# Patient Record
Sex: Male | Born: 1965
Health system: Southern US, Community
[De-identification: ages and names within clinical notes are randomized; demographics above are authoritative.]

## PROBLEM LIST (undated history)

## (undated) DIAGNOSIS — E119 Type 2 diabetes mellitus without complications: Secondary | ICD-10-CM

## (undated) DIAGNOSIS — E785 Hyperlipidemia, unspecified: Secondary | ICD-10-CM

## (undated) DIAGNOSIS — M109 Gout, unspecified: Secondary | ICD-10-CM

## (undated) DIAGNOSIS — N189 Chronic kidney disease, unspecified: Secondary | ICD-10-CM

## (undated) DIAGNOSIS — G4733 Obstructive sleep apnea (adult) (pediatric): Secondary | ICD-10-CM

## (undated) DIAGNOSIS — T7840XA Allergy, unspecified, initial encounter: Secondary | ICD-10-CM

## (undated) DIAGNOSIS — G4730 Sleep apnea, unspecified: Secondary | ICD-10-CM

## (undated) DIAGNOSIS — R7303 Prediabetes: Secondary | ICD-10-CM

## (undated) DIAGNOSIS — I10 Essential (primary) hypertension: Secondary | ICD-10-CM

## (undated) DIAGNOSIS — K219 Gastro-esophageal reflux disease without esophagitis: Secondary | ICD-10-CM

## (undated) DIAGNOSIS — N200 Calculus of kidney: Secondary | ICD-10-CM

## (undated) DIAGNOSIS — E559 Vitamin D deficiency, unspecified: Secondary | ICD-10-CM

## (undated) HISTORY — DX: Obstructive sleep apnea (adult) (pediatric): G47.33

## (undated) HISTORY — DX: Chronic kidney disease, unspecified: N18.9

## (undated) HISTORY — DX: Calculus of kidney: N20.0

## (undated) HISTORY — DX: Gastro-esophageal reflux disease without esophagitis: K21.9

## (undated) HISTORY — DX: Sleep apnea, unspecified: G47.30

## (undated) HISTORY — DX: Vitamin D deficiency, unspecified: E55.9

## (undated) HISTORY — DX: Allergy, unspecified, initial encounter: T78.40XA

## (undated) HISTORY — DX: Gout, unspecified: M10.9

## (undated) HISTORY — DX: Type 2 diabetes mellitus without complications: E11.9

## (undated) HISTORY — DX: Hyperlipidemia, unspecified: E78.5

---

## 1995-11-28 HISTORY — PX: KNEE ARTHROSCOPY: SHX127

## 2001-03-26 ENCOUNTER — Emergency Department (HOSPITAL_COMMUNITY): Admission: EM | Admit: 2001-03-26 | Discharge: 2001-03-27 | Payer: Self-pay | Admitting: *Deleted

## 2010-01-08 ENCOUNTER — Encounter: Admission: RE | Admit: 2010-01-08 | Discharge: 2010-01-08 | Payer: Self-pay | Admitting: Internal Medicine

## 2011-02-13 LAB — LIPID PANEL
LDL Cholesterol: 129 mg/dL
Triglycerides: 285 mg/dL — AB (ref 40–160)

## 2011-02-13 LAB — BASIC METABOLIC PANEL: Glucose: 104 mg/dL

## 2011-03-05 ENCOUNTER — Encounter: Payer: Self-pay | Admitting: *Deleted

## 2011-06-11 ENCOUNTER — Encounter: Payer: Self-pay | Admitting: Cardiology

## 2012-02-07 ENCOUNTER — Emergency Department
Admission: EM | Admit: 2012-02-07 | Discharge: 2012-02-07 | Disposition: A | Discharge status: Home Health | Payer: BC Managed Care – PPO | Source: Home / Self Care

## 2012-02-07 ENCOUNTER — Emergency Department
Admit: 2012-02-07 | Discharge: 2012-02-07 | Disposition: A | Discharge status: Home / Self Care | Payer: BC Managed Care – PPO

## 2012-02-07 DIAGNOSIS — M25549 Pain in joints of unspecified hand: Secondary | ICD-10-CM

## 2012-02-07 DIAGNOSIS — S6980XA Other specified injuries of unspecified wrist, hand and finger(s), initial encounter: Secondary | ICD-10-CM

## 2012-02-07 DIAGNOSIS — S6990XA Unspecified injury of unspecified wrist, hand and finger(s), initial encounter: Secondary | ICD-10-CM

## 2012-02-07 HISTORY — DX: Essential (primary) hypertension: I10

## 2012-02-07 NOTE — ED Provider Notes (Signed)
History     CSN: [id number]  Arrival date & time 02/07/12  1454   First MD Initiated Contact with Patient 02/07/12 1510      Chief Complaint  Patient presents with  . Finger Injury  HPI Comments: Pt states that he was helping a family member with decking last week when a large portion of decking fell and struck his finger. Pt states that he self buddy taped and saw PCP later in the week. Pt was rxd vicodin for this. Xray was not able to be obtained at the time because imaging was down. Pt is presenting today because of persistence of pain.   Patient is a 46 y.o. male presenting with hand pain. The history is provided by the patient.  Hand Pain This is a new problem. The current episode started more than 1 week ago. The problem occurs constantly. Pertinent negatives include no chest pain, no abdominal pain, no headaches and no shortness of breath. Exacerbated by: gripping with R hand  The symptoms are relieved by NSAIDs and narcotics. Treatments tried: buddy taping     Past Medical History  Diagnosis Date  . GERD (gastroesophageal reflux disease)   . Hypertension     Past Surgical History  Procedure Date  . Knee arthroscopy 11/28/95    left Terrilee Croak)    Family History  Problem Relation Age of Onset  . Diabetes Mother   . Hyperlipidemia Father   . Heart disease Father   . Hypertension Father   . Diabetes Father   . Diabetes Sister   . Hypertension Sister   . Hyperlipidemia Brother   . Hypertension Brother     History  Substance Use Topics  . Smoking status: Not on file  . Smokeless tobacco: Former Neurosurgeon    Types: Chew    Quit date: 01/12/2011  . Alcohol Use: 3.6 oz/week    6 Cans of beer per week      Review of Systems  Respiratory: Negative for shortness of breath.   Cardiovascular: Negative for chest pain.  Gastrointestinal: Negative for abdominal pain.  Neurological: Negative for headaches.    Allergies  Review of patient's allergies indicates no known  allergies.  Home Medications   Current Outpatient Rx  Name Route Sig Dispense Refill  . ESOMEPRAZOLE MAGNESIUM 40 MG PO CPDR Oral Take 40 mg by mouth daily.        BP 157/89  Pulse 68  Temp 98.5 F (36.9 C)  Resp 16  Ht 5\' 8"  (1.727 m)  Wt 237 lb 8 oz (107.729 kg)  BMI 36.11 kg/m2  SpO2 98%  Physical Exam  Constitutional:       Alert, obese  HENT:  Head: Normocephalic and atraumatic.  Neck: Neck supple.  Cardiovascular: Normal rate and regular rhythm.   Pulmonary/Chest: Effort normal and breath sounds normal.  Musculoskeletal:       Arms:      Left hand: He exhibits tenderness and swelling. He exhibits normal capillary refill, no deformity and no laceration. normal sensation noted. Decreased strength noted.    ED Course  Procedures (including critical care time)  Labs Reviewed - No data to display Dg Hand Complete Left  02/07/2012  *RADIOLOGY REPORT*  Clinical Data: Pain in the third MCP joint.  Injury 1 week ago.  LEFT HAND - COMPLETE 3+ VIEW  Comparison: None.  Findings: Three views of the left hand were obtained. There is a mildly displaced fracture at the base of the middle finger proximal  phalanx.  Fracture is along the radial aspect of the proximal phalanx.  There is probably extension of the fracture to the articulating surface of the MCP joint.  There are no other definite fractures.  Cannot exclude soft tissue swelling in the palm.  IMPRESSION: Mildly displaced fracture at the base of the middle finger proximal phalanx.  Original Report Authenticated By: Richarda Overlie, M.D.     No diagnosis found.    MDM  L middle finger mildly displaced proximal phalanx fracture. Finger splint and buddy tape placed. Discussed NSAIDs for pain relief. Handout given. Follow up with PCP in 1-2 weeks.         Floydene Flock, MD 02/07/12 1630

## 2012-02-07 NOTE — ED Notes (Signed)
Pt c/o L 3rd finger injury onset last Saturday while building a deck.  Pt states railing fell and he put hand up to protect head.  Pt states he saw PCP but was unable to get films due to xray being down.  Pt arrived buddy taped, states pain increases with movement.  Pt taking Vicodin for pain.

## 2012-02-07 NOTE — Discharge Instructions (Signed)
Patient information: Finger fracture (The Basics)View in SpanishWritten by the doctors and editors at [employer]To[employer]  What is a finger fracture? -- A "fracture" is another word for a broken bone. A finger fracture is when a person breaks a finger bone (figure 1).  There are different types of fractures. The type of fracture depends on how the bone breaks and whether the broken bone sticks out of the skin or not. What are the symptoms of a finger fracture? -- Symptoms of a finger fracture include: Pain  Swelling  Bruising  Stiffness  Weakness of the hand A finger fracture can also make the finger bent in an abnormal position. Will I need tests? -- Yes. Your doctor or nurse will ask about your symptoms, do an exam, and do an X-ray.  Is there anything I can do on my own to reduce swelling? -- Yes. For the first 1 to 2 days after your injury, you can: Try to keep your finger raised above the level of your heart.  Put a cold gel pack, bag of ice, or bag of frozen vegetables on the injured area every 1 to 2 hours, for 15 minutes each time. Put a thin towel between the ice (or other cold object) and your skin. Use the ice (or other cold object) for at least 6 hours after your injury. Some people find it helpful to ice longer, even up to 2 days after their injury. How are finger fractures treated? -- Treatment depends on the type of finger fracture you have and how severe it is. If you have a lot of pain or a severe fracture, your doctor will prescribe a strong pain medicine. If your fracture is mild, your doctor might recommend that you take an over-the-counter medicine for your pain. Over-the-counter medicines include acetaminophen (sample brand name: Tylenol), ibuprofen (sample brand names: Advil, Motrin), and naproxen (sample brand names: Aleve, Naprosyn). Finger fractures are usually treated with a splint, "buddy taping," or both. Buddy taping involves taping your injured finger to the finger next to it  (picture 1). Before your doctor puts on a splint, he or she will make sure that your finger bones are in the correct position. If your bones are not in the correct position, he or she might need to do a procedure to put your bones in the correct position. Severe fractures or fractures that involve the bone sticking out of the skin are usually treated by a specialist called a hand surgeon. Treatment for these types of fractures usually involves surgery. Your doctor might also prescribe an antibiotic medicine to prevent an infection if your bone is sticking out of your skin. After your splint comes off, your doctor might recommend that you work with a physical therapist (exercise expert). He or she can show you exercises and stretches to strengthen your finger muscles and keep your fingers from getting stiff. How long do finger fractures take to heal? -- Fractures can take weeks to months to heal, depending on the type of fracture. Healing time also depends on the person. Healthy children usually heal very quickly. Older adults or adults with other medical problems can take much longer to heal. Can I do anything to improve the healing process? -- Yes. It's important to follow all of your doctor's instructions while your fracture is healing. He or she will probably recommend that you eat a healthy diet that includes getting enough calcium, vitamin D, and protein (figure 2). He or she will also probably recommend that  you avoid doing certain things. For example, he or she might recommend that you: Avoid doing certain activities.  Avoid smoking. A fracture can take longer to heal if you smoke.  Avoid damaging your cast or getting it wet, if you have a cast that shouldn't get wet. When should I call my doctor or nurse? -- Your doctor or nurse will tell you when to call him or her. In general, you should call him or her if: You have severe pain, or your pain or swelling gets worse.  You have numbness or tingling  in your fingers, or your fingers look blue or purple.  You damage your cast or get it wet, and it's not supposed to get wet.

## 2012-02-09 NOTE — ED Provider Notes (Signed)
Agree with exam, assessment, and plan.   Lattie Haw, MD 02/09/12 [phone]

## 2012-05-03 ENCOUNTER — Telehealth: Payer: Self-pay | Admitting: *Deleted

## 2013-01-14 ENCOUNTER — Ambulatory Visit (INDEPENDENT_AMBULATORY_CARE_PROVIDER_SITE_OTHER): Payer: BC Managed Care – PPO | Admitting: Physician Assistant

## 2013-01-14 ENCOUNTER — Other Ambulatory Visit: Payer: Self-pay | Admitting: Physician Assistant

## 2013-01-14 ENCOUNTER — Encounter: Payer: Self-pay | Admitting: Physician Assistant

## 2013-01-14 ENCOUNTER — Ambulatory Visit: Payer: BC Managed Care – PPO

## 2013-01-14 ENCOUNTER — Ambulatory Visit: Payer: Self-pay | Admitting: Physician Assistant

## 2013-01-14 VITALS — BP 138/79 | HR 73 | Temp 99.0°F | Ht 67.5 in | Wt 250.8 lb

## 2013-01-14 DIAGNOSIS — M25562 Pain in left knee: Secondary | ICD-10-CM

## 2013-01-14 DIAGNOSIS — M25569 Pain in unspecified knee: Secondary | ICD-10-CM

## 2013-01-14 NOTE — Progress Notes (Signed)
  Subjective:    Patient ID: Jimmy Perez., male    DOB: 1966-07-20, 47 y.o.   MRN: [id number]  HPI Left knee pain, no accident no injury; progressively getting worse over time; pain can get so intense that he has to take weight off the left leg; pain is worse in climbing up stairs    Review of Systems  All other systems reviewed and are negative.       Objective:   Physical Exam  Musculoskeletal:  Left knee mediolateral aspect tenderness Mild medial effusion, no ecchymosis   [facility] reading (PRIMARY) by  Maylon Cos                                Shows a possible foreign body along medial aspect          Assessment & Plan:  Left knee pain

## 2013-01-22 ENCOUNTER — Ambulatory Visit (INDEPENDENT_AMBULATORY_CARE_PROVIDER_SITE_OTHER): Payer: BC Managed Care – PPO | Admitting: Physician Assistant

## 2013-01-22 ENCOUNTER — Encounter: Payer: Self-pay | Admitting: Physician Assistant

## 2013-01-22 VITALS — BP 123/73 | HR 56 | Temp 97.6°F | Ht 68.0 in

## 2013-01-22 DIAGNOSIS — J209 Acute bronchitis, unspecified: Secondary | ICD-10-CM

## 2013-01-22 MED ORDER — CLARITHROMYCIN 250 MG PO TABS: 500.0000 mg | ORAL_TABLET | Freq: Two times a day (BID) | ORAL | Status: DC

## 2013-01-22 MED ORDER — HYDROCODONE-HOMATROPINE 5-1.5 MG/5ML PO SYRP: 5.0000 mL | ORAL_SOLUTION | Freq: Every evening | ORAL | Status: DC | PRN

## 2013-01-22 NOTE — Progress Notes (Signed)
  Subjective:    Patient ID: Jimmy Perez., male    DOB: 1966-03-30, 47 y.o.   MRN: [id number]  HPI Progressively worsening sx over the past 3 days; productive cough, prevents sleep, right rib pain   Review of Systems  Constitutional: Positive for fever, chills and fatigue.  HENT: Positive for congestion.   Respiratory: Positive for cough and choking.   All other systems reviewed and are negative.       Objective:   Physical Exam  Vitals reviewed. Constitutional: He appears well-developed and well-nourished.  HENT:  Head: Normocephalic and atraumatic.  Right Ear: External ear normal.  Left Ear: External ear normal.  Mouth/Throat: Oropharynx is clear and moist.  L>R nasal hypertrophy 2+ injected tonsils  Eyes: Conjunctivae and EOM are normal. Pupils are equal, round, and reactive to light.  Neck: Normal range of motion. Neck supple.  Cardiovascular: Normal rate, regular rhythm and normal heart sounds.   Pulmonary/Chest: Effort normal and breath sounds normal.  Faint crackles          Assessment & Plan:  Acute bronchitis - Plan: clarithromycin (BIAXIN) 250 MG tablet, HYDROcodone-homatropine (HYCODAN) 5-1.5 MG/5ML syrup

## 2013-03-31 ENCOUNTER — Telehealth: Payer: Self-pay | Admitting: *Deleted

## 2013-03-31 DIAGNOSIS — W57XXXA Bitten or stung by nonvenomous insect and other nonvenomous arthropods, initial encounter: Secondary | ICD-10-CM

## 2013-03-31 MED ORDER — DOXYCYCLINE HYCLATE 100 MG PO TABS: 100.0000 mg | ORAL_TABLET | Freq: Two times a day (BID) | ORAL | Status: DC

## 2013-03-31 NOTE — Telephone Encounter (Signed)
Patient is having joint pain

## 2013-03-31 NOTE — Telephone Encounter (Signed)
Nl reaction after tick removal Cont to observe OTC antihist

## 2013-03-31 NOTE — Telephone Encounter (Signed)
Removed 2 ticks last night and they had been there for a few days. Areas are red and swollen can we please call in doxy

## 2013-04-18 ENCOUNTER — Encounter: Payer: Self-pay | Admitting: Physician Assistant

## 2013-04-18 ENCOUNTER — Ambulatory Visit (INDEPENDENT_AMBULATORY_CARE_PROVIDER_SITE_OTHER): Payer: 59 | Admitting: Physician Assistant

## 2013-04-18 VITALS — BP 140/73 | HR 50 | Temp 96.9°F | Ht 68.0 in | Wt 244.0 lb

## 2013-04-18 DIAGNOSIS — R635 Abnormal weight gain: Secondary | ICD-10-CM

## 2013-04-18 DIAGNOSIS — E785 Hyperlipidemia, unspecified: Secondary | ICD-10-CM

## 2013-04-18 DIAGNOSIS — E669 Obesity, unspecified: Secondary | ICD-10-CM

## 2013-04-18 DIAGNOSIS — K219 Gastro-esophageal reflux disease without esophagitis: Secondary | ICD-10-CM

## 2013-04-18 DIAGNOSIS — I10 Essential (primary) hypertension: Secondary | ICD-10-CM | POA: Insufficient documentation

## 2013-04-18 DIAGNOSIS — M2550 Pain in unspecified joint: Secondary | ICD-10-CM

## 2013-04-18 LAB — LIPID PANEL
Cholesterol: 188 mg/dL (ref 0–200)
HDL: 35 mg/dL — ABNORMAL LOW (ref >39)
Triglycerides: 382 mg/dL — ABNORMAL HIGH (ref <150)

## 2013-04-18 LAB — HEPATIC FUNCTION PANEL
ALT: 25 U/L (ref 0–53)
Alkaline Phosphatase: 58 U/L (ref 39–117)
Total Protein: 6.6 g/dL (ref 6.0–8.3)

## 2013-04-18 MED ORDER — CELECOXIB 200 MG PO CAPS: 200.0000 mg | ORAL_CAPSULE | Freq: Every day | ORAL | Status: DC | PRN

## 2013-04-18 MED ORDER — AMLODIPINE-OLMESARTAN 5-20 MG PO TABS: 5.0000 | ORAL_TABLET | Freq: Every day | ORAL | Status: DC

## 2013-04-18 MED ORDER — ESOMEPRAZOLE MAGNESIUM 40 MG PO CPDR: 40.0000 mg | DELAYED_RELEASE_CAPSULE | Freq: Every day | ORAL | Status: DC

## 2013-04-18 NOTE — Progress Notes (Signed)
Subjective:     Patient ID: Jimmy Perez., male   DOB: July 14, 1966, 47 y.o.   MRN: [id number]  HPI Pt he for recheck of his GERD and HTN Pt also with pain to the L groin area Sx worse with certain movements Pain for ~ 2 weeks No hx of same He denies any CP, SOB, change in endurance, or lower ext edema Tolerating all meds well  Review of Systems  All other systems reviewed and are negative.       Objective:   Physical Exam  Nursing note and vitals reviewed. NAD No bruits Heart- RRR w/o M Lungs- CTA Pulses equal in upper ext No lower ext edema FROM L hip w/o sx SL TTP L groin Frog leg maneuver reproduces pain Good strength distal     Assessment:     1. HTN, goal below 140/80   2. Other and unspecified hyperlipidemia   3. HTN (hypertension)   4. GERD (gastroesophageal reflux disease)   5. Obesity, unspecified   6. Joint pain        Plan:     Full labs done today Meds rf done Will inform of results For groin strain - heat/ice Gentle stretching F/U prn

## 2013-04-18 NOTE — Patient Instructions (Signed)
Inguinal Strain Your exam shows you have an inguinal strain. This is also known as a pulled groin. This injury is usually due to a pull or partial tear to a muscle or tendon in the groin area. Most groin pulls take several weeks to heal completely. There may be pain with lifting your leg or walking during much of your recovery. Treatment for groin strains includes:  Rest and avoid lifting or performing activities that increase your pain.  Apply ice packs for 20-30 minutes every few hours to reduce pain and swelling over the next 2-3 days.  Medicine to reduce pain and inflammation is often prescribed. HOME CARE INSTRUCTIONS  While most strains in the groin area will heal with rest, you should also watch for any signs of a more serious condition.  SEEK IMMEDIATE MEDICAL CARE IF:   You notice unusual swelling or bulging in the groin.  You have pain or swelling in the testicle.  Blood in your urine.  Marked increased pain.  Weakness or numbness of your leg or abdominal pain. MAKE SURE YOU:   Understand these instructions.  Will watch your condition.  Will get help right away if you are not doing well or get worse. Document Released: 11/20/2004 Document Revised: 01/05/2012 Document Reviewed: 02/17/2008 [employer] Patient Information 2014 [employer].  

## 2013-04-19 ENCOUNTER — Encounter: Payer: Self-pay | Admitting: *Deleted

## 2013-04-19 LAB — BASIC METABOLIC PANEL WITH GFR
BUN: 15 mg/dL (ref 6–23)
Chloride: 101 mEq/L (ref 96–112)
Creat: 0.93 mg/dL (ref 0.50–1.35)
GFR, Est African American: >89 mL/min
GFR, Est Non African American: >89 mL/min
Glucose, Bld: 86 mg/dL (ref 70–99)
Potassium: 4.4 mEq/L (ref 3.5–5.3)
Sodium: 138 mEq/L (ref 135–145)

## 2013-04-20 ENCOUNTER — Other Ambulatory Visit: Payer: Self-pay | Admitting: *Deleted

## 2013-04-20 DIAGNOSIS — E785 Hyperlipidemia, unspecified: Secondary | ICD-10-CM

## 2013-04-20 NOTE — Progress Notes (Signed)
Patient started on Crestor and needs to have lipid/liver rechecked in 6 weeks.

## 2013-05-02 ENCOUNTER — Telehealth: Payer: Self-pay | Admitting: *Deleted

## 2013-05-02 NOTE — Telephone Encounter (Signed)
Ins co has denied azor and nexium, they are saying he has to try amlodipine used in combination with benicar either brand or generic and  For the nexium he has to try prilosec (which I told them he had tried and failed) the other options are generic protonix orgeneric aciphex or dexilant. Can you take care of this for me? Thanks.

## 2013-07-19 ENCOUNTER — Ambulatory Visit: Payer: 59 | Admitting: Family Medicine

## 2013-07-19 ENCOUNTER — Encounter: Payer: Self-pay | Admitting: Family Medicine

## 2013-07-19 ENCOUNTER — Ambulatory Visit (INDEPENDENT_AMBULATORY_CARE_PROVIDER_SITE_OTHER): Payer: 59 | Admitting: Family Medicine

## 2013-07-19 ENCOUNTER — Encounter: Payer: Self-pay | Admitting: *Deleted

## 2013-07-19 ENCOUNTER — Ambulatory Visit: Payer: 59 | Admitting: General Practice

## 2013-07-19 ENCOUNTER — Ambulatory Visit (INDEPENDENT_AMBULATORY_CARE_PROVIDER_SITE_OTHER): Payer: 59

## 2013-07-19 VITALS — BP 146/84 | HR 55 | Temp 98.2°F | Ht 68.0 in | Wt 241.0 lb

## 2013-07-19 DIAGNOSIS — M545 Low back pain, unspecified: Secondary | ICD-10-CM

## 2013-07-19 MED ORDER — MELOXICAM 15 MG PO TABS: 15.0000 mg | ORAL_TABLET | Freq: Every day | ORAL | Status: DC

## 2013-07-19 NOTE — Patient Instructions (Signed)
Use warm wet compresses 20 minutes 3 or 4 times daily Avoid heavy lifting pushing or pulling Take medication as directed after eating once daily Remain out of work for the rest of the week return to clinic in 2 weeks and if not better consider getting an MRI or a course of prednisone

## 2013-07-19 NOTE — Progress Notes (Signed)
Subjective:    Patient ID: Jimmy Perez., male    DOB: Oct 18, 1966, 47 y.o.   MRN: [id number]  HPIPT HERE TODAY FOR BACK PAIN. Patient comes in today complaining of low back pain off and on for one month. Tylenol helped until about 5 days ago in the back pain became much worse. It is in the low back it is also worse in the morning. 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 He does not however recall any specific injury   Patient Active Problem List   Diagnosis Date Noted  . HTN (hypertension) 04/18/2013  . Other and unspecified hyperlipidemia 04/18/2013  . GERD (gastroesophageal reflux disease) 04/18/2013  . Obesity, unspecified 04/18/2013   Outpatient Encounter Prescriptions as of 07/19/2013  Medication Sig Dispense Refill  . acetaminophen (TYLENOL) 500 MG tablet Take 500 mg by mouth every 6 (six) hours as needed for pain.      Marland Kitchen amLODipine-olmesartan (AZOR) 5-20 MG per tablet Take 5-20 tablets by mouth daily.  90 tablet  3  . diazepam (VALIUM) 10 MG tablet Take 10 mg by mouth at bedtime as needed for anxiety.      Marland Kitchen esomeprazole (NEXIUM) 40 MG capsule Take 1 capsule (40 mg total) by mouth daily.  90 capsule  3  . [DISCONTINUED] celecoxib (CELEBREX) 200 MG capsule Take 1 capsule (200 mg total) by mouth daily as needed.  90 capsule  3   No facility-administered encounter medications on file as of 07/19/2013.       Review of Systems  Constitutional: Negative.   HENT: Negative.   Eyes: Negative.   Respiratory: Negative.   Cardiovascular: Negative.   Gastrointestinal: Negative.   Endocrine: Negative.   Genitourinary: Negative.   Musculoskeletal: Positive for back pain (LOW BACK PAIN- WORSE THE LAST 2-3 WEEKS) and arthralgias (LEFT KNEE PAIN- ONGOING PROBLEM).  Skin: Negative.   Allergic/Immunologic: Negative.   Neurological: Negative.   Hematological: Negative.   Psychiatric/Behavioral: Negative.        Objective:   Physical Exam  Nursing  note and vitals reviewed. Constitutional: He is oriented to person, place, and time. He appears well-developed and well-nourished. No distress.  HENT:  Head: Normocephalic and atraumatic.  Neck: Normal range of motion.  Musculoskeletal: He exhibits tenderness (there is tenderness in the lumbar area and the paralumbar area bilaterally.).  Limited range of motion secondary to back pain With leg raising there was more pain in the right low back to the left low back With leg raising against resistance there was pain bilaterally but worse in the right versus the left  Neurological: He is alert and oriented to person, place, and time. He has normal reflexes.  Skin: Skin is warm and dry. No rash noted.  Psychiatric: He has a normal mood and affect. His behavior is normal. Judgment and thought content normal.   BP 146/84  Pulse 55  Temp(Src) 98.2 F (36.8 C) (Oral)  Ht 5\' 8"  (1.727 m)  Wt 241 lb (109.317 kg)  BMI 36.65 kg/m2    [facility] reading (PRIMARY) by  Dr. Christell Constant: LS spine: Degenerative changes                                     Assessment & Plan:    1. Low back pain    Meds ordered this encounter  Medications  . acetaminophen (  TYLENOL) 500 MG tablet    Sig: Take 500 mg by mouth every 6 (six) hours as needed for pain.  . meloxicam (MOBIC) 15 MG tablet    Sig: Take 1 tablet (15 mg total) by mouth daily.    Dispense:  30 tablet    Refill:  0   Patient Instructions  Use warm wet compresses 20 minutes 3 or 4 times daily Avoid heavy lifting pushing or pulling Take medication as directed after eating once daily Remain out of work for the rest of the week return to clinic in 2 weeks and if not better consider getting an MRI or a course of prednisone   Nyra Capes MD

## 2013-09-25 DIAGNOSIS — Z23 Encounter for immunization: Secondary | ICD-10-CM

## 2013-09-27 ENCOUNTER — Other Ambulatory Visit (INDEPENDENT_AMBULATORY_CARE_PROVIDER_SITE_OTHER): Payer: 59 | Admitting: *Deleted

## 2013-09-27 DIAGNOSIS — Z23 Encounter for immunization: Secondary | ICD-10-CM

## 2013-10-05 ENCOUNTER — Ambulatory Visit (INDEPENDENT_AMBULATORY_CARE_PROVIDER_SITE_OTHER): Payer: 59 | Admitting: Family Medicine

## 2013-10-05 ENCOUNTER — Encounter: Payer: Self-pay | Admitting: Family Medicine

## 2013-10-05 ENCOUNTER — Ambulatory Visit (INDEPENDENT_AMBULATORY_CARE_PROVIDER_SITE_OTHER): Payer: 59

## 2013-10-05 VITALS — BP 149/81 | HR 63 | Temp 97.6°F | Wt 257.0 lb

## 2013-10-05 DIAGNOSIS — M25529 Pain in unspecified elbow: Secondary | ICD-10-CM

## 2013-10-05 DIAGNOSIS — M25522 Pain in left elbow: Secondary | ICD-10-CM

## 2013-10-05 DIAGNOSIS — M7710 Lateral epicondylitis, unspecified elbow: Secondary | ICD-10-CM

## 2013-10-05 DIAGNOSIS — M7712 Lateral epicondylitis, left elbow: Secondary | ICD-10-CM

## 2013-10-05 MED ORDER — TRAMADOL-ACETAMINOPHEN 37.5-325 MG PO TABS: 1.0000 | ORAL_TABLET | Freq: Four times a day (QID) | ORAL | Status: DC | PRN

## 2013-10-05 NOTE — Progress Notes (Signed)
   Subjective:    Patient ID: Jimmy Lysle Rubens., male    DOB: April 22, 1966, 47 y.o.   MRN: [id number]  HPI Pt presents today with chief complaint of elbow pain  Pt states that he has had mild elbow pain for an extended period time, however, has noticed lateral elbow pain over the past 2 weeks.  Pt works in Teaching laboratory technician and believes that he may have struck his elbow on an iron railing.  Has had pain with gripping as well as supination.  No true numbness.  Mild decreased grip strength.     Review of Systems  All other systems reviewed and are negative.       Objective:   Physical Exam  Constitutional: He appears well-developed and well-nourished.  HENT:  Head: Normocephalic and atraumatic.  Eyes: Conjunctivae are normal. Pupils are equal, round, and reactive to light.  Neck: Normal range of motion.  Cardiovascular: Normal rate and regular rhythm.   Pulmonary/Chest: Effort normal and breath sounds normal.  Abdominal: Soft.  Musculoskeletal:       Arms: Neurological: He is alert.  Skin: Skin is warm.    [facility] reading (PRIMARY) by  Dr. Alvester Morin  L elbow xray preliminarily negative for any fracture or dislocation.                                         Assessment & Plan:  Elbow pain, left - Plan: DG Elbow 2 Views Left  Lateral epicondylitis (tennis elbow), left - Plan: traMADol-acetaminophen (ULTRACET) 37.5-325 MG per tablet, Ambulatory referral to Sports Medicine  Exam consistent with lateral epicondylitis.  Counterforce strap  Ultracet (relative NSAID contraindication of moderate GERD and HTN).  Discussed general care and [facility] red flags.  Follow up with sports medicine as pt may benefit from ultrasound guided evaluation of affected area.  Defer to sports medicine.

## 2013-10-05 NOTE — Patient Instructions (Signed)
Lateral Epicondylitis (Tennis Elbow) with Rehab Lateral epicondylitis involves inflammation and pain around the outer portion of the elbow. The pain is caused by inflammation of the tendons in the forearm that bring back (extend) the wrist. Lateral epicondylittis is also called tennis elbow, because it is very common in tennis players. However, it may occur in any individual who extends the wrist repetitively. If lateral epicondylitis is left untreated, it may become a chronic problem. SYMPTOMS   Pain, tenderness, and inflammation on the outer (lateral) side of the elbow.  Pain or weakness with gripping activities.  Pain that increases with wrist twisting motions (playing tennis, using a screwdriver, opening a door or a jar).  Pain with lifting objects, including a coffee cup. CAUSES  Lateral epicondylitis is caused by inflammation of the tendons that extend the wrist. Causes of injury may include:  Repetitive stress and strain on the muscles and tendons that extend the wrist.  Sudden change in activity level or intensity.  Incorrect grip in racquet sports.  Incorrect grip size of racquet (often too large).  Incorrect hitting position or technique (usually backhand, leading with the elbow).  Using a racket that is too heavy. RISK INCREASES WITH:  Sports or occupations that require repetitive and/or strenuous forearm and wrist movements (tennis, squash, racquetball, carpentry).  Poor wrist and forearm strength and flexibility.  Failure to warm up properly before activity.  Resuming activity before healing, rehabilitation, and conditioning are complete. PREVENTION   Warm up and stretch properly before activity.  Maintain physical fitness:  Strength, flexibility, and endurance.  Cardiovascular fitness.  Wear and use properly fitted equipment.  Learn and use proper technique and have a coach correct improper technique.  Wear a tennis elbow (counterforce) brace. PROGNOSIS   The course of this condition depends on the degree of the injury. If treated properly, acute cases (symptoms lasting less than 4 weeks) are often resolved in 2 to 6 weeks. Chronic (longer lasting cases) often resolve in 3 to 6 months, but may require physical therapy. RELATED COMPLICATIONS   Frequently recurring symptoms, resulting in a chronic problem. Properly treating the problem the first time decreases frequency of recurrence.  Chronic inflammation, scarring tendon degeneration, and partial tendon tear, requiring surgery.  Delayed healing or resolution of symptoms. TREATMENT  Treatment first involves the use of ice and medicine, to reduce pain and inflammation. Strengthening and stretching exercises may help reduce discomfort, if performed regularly. These exercises may be performed at home, if the condition is an acute injury. Chronic cases may require a referral to a physical therapist for evaluation and treatment. Your caregiver may advise a corticosteroid injection, to help reduce inflammation. Rarely, surgery is needed. MEDICATION  If pain medicine is needed, nonsteroidal anti-inflammatory medicines (aspirin and ibuprofen), or other minor pain relievers (acetaminophen), are often advised.  Do not take pain medicine for 7 days before surgery.  Prescription pain relievers may be given, if your caregiver thinks they are needed. Use only as directed and only as much as you need.  Corticosteroid injections may be recommended. These injections should be reserved only for the most severe cases, because they can only be given a certain number of times. HEAT AND COLD  Cold treatment (icing) should be applied for 10 to 15 minutes every 2 to 3 hours for inflammation and pain, and immediately after activity that aggravates your symptoms. Use ice packs or an ice massage.  Heat treatment may be used before performing stretching and strengthening activities prescribed by your   caregiver, physical  therapist, or athletic trainer. Use a heat pack or a warm water soak. SEEK MEDICAL CARE IF: Symptoms get worse or do not improve in 2 weeks, despite treatment. EXERCISES  RANGE OF MOTION (ROM) AND STRETCHING EXERCISES - Epicondylitis, Lateral (Tennis Elbow) These exercises may help you when beginning to rehabilitate your injury. Your symptoms may go away with or without further involvement from your physician, physical therapist or athletic trainer. While completing these exercises, remember:   Restoring tissue flexibility helps normal motion to return to the joints. This allows healthier, less painful movement and activity.  An effective stretch should be held for at least 30 seconds.  A stretch should never be painful. You should only feel a gentle lengthening or release in the stretched tissue. RANGE OF MOTION  Wrist Flexion, Active-Assisted  Extend your right / left elbow with your fingers pointing down.*  Gently pull the back of your hand towards you, until you feel a gentle stretch on the top of your forearm.  Hold this position for __________ seconds. Repeat __________ times. Complete this exercise __________ times per day.  *If directed by your physician, physical therapist or athletic trainer, complete this stretch with your elbow bent, rather than extended. RANGE OF MOTION  Wrist Extension, Active-Assisted  Extend your right / left elbow and turn your palm upwards.*  Gently pull your palm and fingertips back, so your wrist extends and your fingers point more toward the ground.  You should feel a gentle stretch on the inside of your forearm.  Hold this position for __________ seconds. Repeat __________ times. Complete this exercise __________ times per day. *If directed by your physician, physical therapist or athletic trainer, complete this stretch with your elbow bent, rather than extended. STRETCH - Wrist Flexion  Place the back of your right / left hand on a tabletop,  leaving your elbow slightly bent. Your fingers should point away from your body.  Gently press the back of your hand down onto the table by straightening your elbow. You should feel a stretch on the top of your forearm.  Hold this position for __________ seconds. Repeat __________ times. Complete this stretch __________ times per day.  STRETCH  Wrist Extension   Place your right / left fingertips on a tabletop, leaving your elbow slightly bent. Your fingers should point backwards.  Gently press your fingers and palm down onto the table by straightening your elbow. You should feel a stretch on the inside of your forearm.  Hold this position for __________ seconds. Repeat __________ times. Complete this stretch __________ times per day.  STRENGTHENING EXERCISES - Epicondylitis, Lateral (Tennis Elbow) These exercises may help you when beginning to rehabilitate your injury. They may resolve your symptoms with or without further involvement from your physician, physical therapist or athletic trainer. While completing these exercises, remember:   Muscles can gain both the endurance and the strength needed for everyday activities through controlled exercises.  Complete these exercises as instructed by your physician, physical therapist or athletic trainer. Increase the resistance and repetitions only as guided.  You may experience muscle soreness or fatigue, but the pain or discomfort you are trying to eliminate should never worsen during these exercises. If this pain does get worse, stop and make sure you are following the directions exactly. If the pain is still present after adjustments, discontinue the exercise until you can discuss the trouble with your caregiver. STRENGTH Wrist Flexors  Sit with your right / left forearm palm-up and   fully supported on a table or countertop. Your elbow should be resting below the height of your shoulder. Allow your wrist to extend over the edge of the  surface.  Loosely holding a __________ weight, or a piece of rubber exercise band or tubing, slowly curl your hand up toward your forearm.  Hold this position for __________ seconds. Slowly lower the wrist back to the starting position in a controlled manner. Repeat __________ times. Complete this exercise __________ times per day.  STRENGTH  Wrist Extensors  Sit with your right / left forearm palm-down and fully supported on a table or countertop. Your elbow should be resting below the height of your shoulder. Allow your wrist to extend over the edge of the surface.  Loosely holding a __________ weight, or a piece of rubber exercise band or tubing, slowly curl your hand up toward your forearm.  Hold this position for __________ seconds. Slowly lower the wrist back to the starting position in a controlled manner. Repeat __________ times. Complete this exercise __________ times per day.  STRENGTH - Ulnar Deviators  Stand with a ____________________ weight in your right / left hand, or sit while holding a rubber exercise band or tubing, with your healthy arm supported on a table or countertop.  Move your wrist, so that your pinkie travels toward your forearm and your thumb moves away from your forearm.  Hold this position for __________ seconds and then slowly lower the wrist back to the starting position. Repeat __________ times. Complete this exercise __________ times per day STRENGTH - Radial Deviators  Stand with a ____________________ weight in your right / left hand, or sit while holding a rubber exercise band or tubing, with your injured arm supported on a table or countertop.  Raise your hand upward in front of you or pull up on the rubber tubing.  Hold this position for __________ seconds and then slowly lower the wrist back to the starting position. Repeat __________ times. Complete this exercise __________ times per day. STRENGTH  Forearm Supinators   Sit with your right /  left forearm supported on a table, keeping your elbow below shoulder height. Rest your hand over the edge, palm down.  Gently grip a hammer or a soup ladle.  Without moving your elbow, slowly turn your palm and hand upward to a "thumbs-up" position.  Hold this position for __________ seconds. Slowly return to the starting position. Repeat __________ times. Complete this exercise __________ times per day.  STRENGTH  Forearm Pronators   Sit with your right / left forearm supported on a table, keeping your elbow below shoulder height. Rest your hand over the edge, palm up.  Gently grip a hammer or a soup ladle.  Without moving your elbow, slowly turn your palm and hand upward to a "thumbs-up" position.  Hold this position for __________ seconds. Slowly return to the starting position. Repeat __________ times. Complete this exercise __________ times per day.  STRENGTH - Grip  Grasp a tennis ball, a dense sponge, or a large, rolled sock in your hand.  Squeeze as hard as you can, without increasing any pain.  Hold this position for __________ seconds. Release your grip slowly. Repeat __________ times. Complete this exercise __________ times per day.  STRENGTH - Elbow Extensors, Isometric  Stand or sit upright, on a firm surface. Place your right / left arm so that your palm faces your stomach, and it is at the height of your waist.  Place your opposite hand on   the underside of your forearm. Gently push up as your right / left arm resists. Push as hard as you can with both arms, without causing any pain or movement at your right / left elbow. Hold this stationary position for __________ seconds. Gradually release the tension in both arms. Allow your muscles to relax completely before repeating. Document Released: 10/13/2005 Document Revised: 01/05/2012 Document Reviewed: 01/25/2009 [employer] Patient Information 2014 [employer].  

## 2013-10-25 ENCOUNTER — Ambulatory Visit (INDEPENDENT_AMBULATORY_CARE_PROVIDER_SITE_OTHER): Payer: 59 | Admitting: Sports Medicine

## 2013-10-25 ENCOUNTER — Encounter: Payer: Self-pay | Admitting: Sports Medicine

## 2013-10-25 VITALS — BP 137/104 | Ht 67.0 in | Wt 250.0 lb

## 2013-10-25 DIAGNOSIS — M7711 Lateral epicondylitis, right elbow: Secondary | ICD-10-CM

## 2013-10-25 DIAGNOSIS — M7710 Lateral epicondylitis, unspecified elbow: Secondary | ICD-10-CM

## 2013-10-25 NOTE — Progress Notes (Signed)
   Subjective:    Patient ID: Jimmy Perez., male    DOB: 1966-07-19, 47 y.o.   MRN: [id number]  HPI 47 year old male presents for aeration of left elbow pain.  Patient was recently seen by Dr. Alvester Morin on 10/05/2013 and was diagnosed with lateral epicondylitis.  He was treated with Ultracet and a compression brace.  He was then sent to sports medicine for further evaluation and potential ultrasound.   Patient reports that he has had lateral left elbow pain for approximately one month.  Patient reports that he has a repetitive job which requires constant lifting and transferring of boxes.  Pain had recently been worsening, but has improved following treatment provided by Dr. Alvester Morin.  He reports decreased strength in his left hand and also reports some associated occasional numbness/tingling.  He reports pain particularly with supination and pronation. No numbness or tingling. 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   History reviewed (see below). Past Medical History  Diagnosis Date  . GERD (gastroesophageal reflux disease)   . Hypertension    Past Surgical History  Procedure Laterality Date  . Knee arthroscopy  11/28/95    left Terrilee Croak)  . Hand surgery     History   Social History  . Marital Status: Married    Spouse Name: N/A    Number of Children: N/A  . Years of Education: N/A   Social History Main Topics  . Smoking status: Never Smoker   . Smokeless tobacco: Former Neurosurgeon    Types: Chew    Quit date: 01/12/2011  . Alcohol Use: 3.6 oz/week    6 Cans of beer per week  . Drug Use: No  . Sexual Activity: None   Other Topics Concern  . None   Social History Narrative  . None   Review of Systems Per HPI    Objective:   Physical Exam Filed Vitals:   10/25/13 1522  BP: 137/104   General: Well-appearing  obese male in no acute distress. MSK: Elbow: Unremarkable to inspection. Range of motion full pronation, supination, flexion, extension. There is tenderness to palpation directly over the lateral epicondyle. Reproducible pain with resisted ECRB testing. No tenderness to palpation at the radial tunnel. Decreased grip strength secondary to pain. Good radial ulnar pulses.  [facility] ultrasound of the left elbow was performed. Limited images over the lateral elbow showed an intact common extensor tendon with no evidence of spurring or obvious tearing.     Assessment & Plan:  Left elbow pain secondary to lateral epicondylitis - Ultrasound performed.  No evidence of bone spurring or tendon tear/injury. - Advised to continue use of compression device.  Also advised purchase of over-the-counter wrist splint to aid symptoms. - Patient to continue Ultracet. NSAIDs contraindicated secondary to GERD and hypertension.   - Patient given wrist/elbow exercises today.   - Patient to refrain from activity which requires use of wrist flexors/extensors.  Patient to use wrist brace at work if possible.  - Patient followup in 3 weeks to reevaluate. I'm hoping that the "change in lines "at work coupled with the eccentric home exercises will help a limited his discomfort. However, if symptoms persist a followup I would strongly consider merits of a single cortisone injection.

## 2013-10-31 ENCOUNTER — Other Ambulatory Visit: Payer: Self-pay | Admitting: *Deleted

## 2013-10-31 DIAGNOSIS — M7712 Lateral epicondylitis, left elbow: Secondary | ICD-10-CM

## 2013-10-31 MED ORDER — TRAMADOL-ACETAMINOPHEN 37.5-325 MG PO TABS: 1.0000 | ORAL_TABLET | Freq: Four times a day (QID) | ORAL | Status: DC | PRN

## 2013-10-31 NOTE — Telephone Encounter (Signed)
Last seen and filled 10/05/13. Rx will print, if approved give to [facility]

## 2013-11-21 ENCOUNTER — Encounter: Payer: Self-pay | Admitting: Sports Medicine

## 2013-11-21 ENCOUNTER — Ambulatory Visit (INDEPENDENT_AMBULATORY_CARE_PROVIDER_SITE_OTHER): Payer: 59 | Admitting: Sports Medicine

## 2013-11-21 VITALS — BP 133/82 | HR 79 | Ht 67.0 in | Wt 250.0 lb

## 2013-11-21 DIAGNOSIS — M7710 Lateral epicondylitis, unspecified elbow: Secondary | ICD-10-CM

## 2013-11-21 MED ORDER — METHYLPREDNISOLONE ACETATE 40 MG/ML IJ SUSP
40.0000 mg | Freq: Once | INTRAMUSCULAR | Status: AC
  Administered 2013-11-21: 40 mg via INTRA_ARTICULAR

## 2013-11-21 NOTE — Progress Notes (Signed)
   Subjective:    Patient ID: Jimmy Perez., male    DOB: 10-19-1966, 48 y.o.   MRN: [id number]  HPI Patient comes in today for followup on left elbow lateral epicondylitis. Overall, he feels like he is about 50% better. He is utilizing both the counterforce brace as well as a full elbow compression sleeve. He has been doing his home eccentric exercise program. He is scheduled to return to the "line" at work next week requires repetitive lifting and twisting with his wrists and elbows. He is here today mainly to consider the merits of a cortisone injection. [facility] ultrasound at the last visit showed no obvious tearing of the common extensor tendon.    Review of Systems     Objective:   Physical Exam Well-developed, well-nourished. No acute distress  Left elbow: Full range of motion. There remains tenderness to palpation of the lateral epicondyles with reproducible pain with the ECRB testing. No soft tissue swelling. Neurovascular intact distally.       Assessment & Plan:  Left elbow pain secondary to lateral epicondylitis  Although his symptoms are improving he would like to go ahead and proceed with a cortisone injection. This was done atraumatically under sterile technique after risks and benefits were explained including the risk of hypopigmentation. Patient tolerated the procedure without difficulty. He will continue with his counterforce bracing and full elbow compression sleeve as well as his eccentric exercises. He will also switch with a friend at work so that he only has to work that specific "line" for one week instead of the regularly scheduled 2 weeks. Followup visit in 4 weeks. If symptoms persist we may need to consider further imaging in the form of x-rays and potentially an MRI scan. If asymptomatic in 4 weeks patient may cancel the followup appointment to followup when necessary.  Consent obtained and verified. Time-out conducted. Noted no overlying erythema,  induration, or other signs of local infection. Skin prepped in a sterile fashion. Topical analgesic spray: Ethyl chloride. Joint: left elbow, lateral epicondyle Needle: 25g 1.5 inch Completed without difficulty. Meds: 1cc depomedrol, 1cc 1% xylocaine  Advised to call if fevers/chills, erythema, induration, drainage, or persistent bleeding.

## 2013-12-20 ENCOUNTER — Ambulatory Visit: Payer: 59 | Admitting: Sports Medicine

## 2014-03-04 ENCOUNTER — Ambulatory Visit (INDEPENDENT_AMBULATORY_CARE_PROVIDER_SITE_OTHER): Payer: 59 | Admitting: Family Medicine

## 2014-03-04 VITALS — BP 133/88 | HR 79 | Temp 97.4°F | Ht 67.0 in | Wt 244.4 lb

## 2014-03-04 DIAGNOSIS — T148 Other injury of unspecified body region: Secondary | ICD-10-CM

## 2014-03-04 DIAGNOSIS — W57XXXA Bitten or stung by nonvenomous insect and other nonvenomous arthropods, initial encounter: Secondary | ICD-10-CM

## 2014-03-04 MED ORDER — DOXYCYCLINE HYCLATE 100 MG PO TABS: 100.0000 mg | ORAL_TABLET | Freq: Two times a day (BID) | ORAL | Status: DC

## 2014-03-04 MED ORDER — METHYLPREDNISOLONE ACETATE 80 MG/ML IJ SUSP
80.0000 mg | Freq: Once | INTRAMUSCULAR | Status: AC
  Administered 2014-03-04: 80 mg via INTRAMUSCULAR

## 2014-03-04 NOTE — Addendum Note (Signed)
Addended by: Waverly Ferrari on: 03/04/2014 09:13 AM   Modules accepted: Orders

## 2014-03-04 NOTE — Progress Notes (Signed)
   Subjective:    Patient ID: Jimmy Loretha Stapler., male    DOB: 05-Apr-1966, 48 y.o.   MRN: [id number]  HPI This 48 y.o. male presents for evaluation of tick bite.  He found tick on his right chest.   Review of Systems    No chest pain, SOB, HA, dizziness, vision change, N/V, diarrhea, constipation, dysuria, urinary urgency or frequency, myalgias, arthralgias or rash.  Objective:   Physical Exam Vital signs noted  Well developed well nourished male.  HEENT - Head atraumatic Normocephali                Throat - oropharanx wnl Respiratory - Lungs CTA bilateral Cardiac - RRR S1 and S2 without murmur GI - Abdomen soft Nontender and bowel sounds active x 4 Extremities - No edema. Neuro - Grossly intact. Skin - Erythematous are right chest and upper abdomen     Assessment & Plan:  Tick bite - Plan: doxycycline (VIBRA-TABS) 100 MG tablet po bid x 10 days  Lysbeth Penner FNP

## 2014-03-28 ENCOUNTER — Telehealth: Payer: Self-pay | Admitting: *Deleted

## 2014-03-28 NOTE — Telephone Encounter (Signed)
Just watch or come in and let me see- don't want to do more doxy yet unless we have to.

## 2014-03-28 NOTE — Telephone Encounter (Signed)
Just that day they believe. The are is atleast the size of a quarter

## 2014-03-28 NOTE — Telephone Encounter (Signed)
Patient wife aware

## 2014-03-28 NOTE — Telephone Encounter (Signed)
Patient removed another tick and the area is red and itches and has some swelling- should we do Doxy?

## 2014-03-28 NOTE — Telephone Encounter (Signed)
He just came off doxy- how ong ws tick there?

## 2014-04-07 ENCOUNTER — Encounter: Payer: 59 | Admitting: Physician Assistant

## 2014-04-07 ENCOUNTER — Other Ambulatory Visit: Payer: Self-pay | Admitting: *Deleted

## 2014-04-07 MED ORDER — LOSARTAN POTASSIUM 50 MG PO TABS: 50.0000 mg | ORAL_TABLET | Freq: Every day | ORAL | Status: DC

## 2014-04-07 MED ORDER — AMLODIPINE BESYLATE 5 MG PO TABS: 5.0000 mg | ORAL_TABLET | Freq: Every day | ORAL | Status: DC

## 2014-04-10 ENCOUNTER — Other Ambulatory Visit: Payer: Self-pay | Admitting: *Deleted

## 2014-04-10 DIAGNOSIS — K219 Gastro-esophageal reflux disease without esophagitis: Secondary | ICD-10-CM

## 2014-04-10 DIAGNOSIS — E559 Vitamin D deficiency, unspecified: Secondary | ICD-10-CM

## 2014-04-10 DIAGNOSIS — I10 Essential (primary) hypertension: Secondary | ICD-10-CM

## 2014-04-10 DIAGNOSIS — E785 Hyperlipidemia, unspecified: Secondary | ICD-10-CM

## 2014-04-14 ENCOUNTER — Ambulatory Visit (INDEPENDENT_AMBULATORY_CARE_PROVIDER_SITE_OTHER): Payer: 59 | Admitting: Family Medicine

## 2014-04-14 ENCOUNTER — Encounter: Payer: Self-pay | Admitting: Family Medicine

## 2014-04-14 ENCOUNTER — Ambulatory Visit (INDEPENDENT_AMBULATORY_CARE_PROVIDER_SITE_OTHER): Payer: 59

## 2014-04-14 VITALS — BP 156/87 | HR 67 | Temp 97.5°F | Ht 67.5 in | Wt 242.0 lb

## 2014-04-14 DIAGNOSIS — Z0000 Encounter for general adult medical examination without abnormal findings: Secondary | ICD-10-CM

## 2014-04-14 DIAGNOSIS — E8881 Metabolic syndrome: Secondary | ICD-10-CM | POA: Insufficient documentation

## 2014-04-14 DIAGNOSIS — E785 Hyperlipidemia, unspecified: Secondary | ICD-10-CM

## 2014-04-14 DIAGNOSIS — K219 Gastro-esophageal reflux disease without esophagitis: Secondary | ICD-10-CM

## 2014-04-14 DIAGNOSIS — E559 Vitamin D deficiency, unspecified: Secondary | ICD-10-CM

## 2014-04-14 DIAGNOSIS — Z8249 Family history of ischemic heart disease and other diseases of the circulatory system: Secondary | ICD-10-CM

## 2014-04-14 DIAGNOSIS — I10 Essential (primary) hypertension: Secondary | ICD-10-CM

## 2014-04-14 LAB — POCT CBC
GRANULOCYTE PERCENT: 71.3 % (ref 37–80)
HCT, POC: 42.9 % — AB (ref 43.5–53.7)
HEMOGLOBIN: 14.1 g/dL (ref 14.1–18.1)
LYMPH, POC: 2 (ref 0.6–3.4)
MCH, POC: 30.6 pg (ref 27–31.2)
MCHC: 32.9 g/dL (ref 31.8–35.4)
MCV: 93.2 fL (ref 80–97)
MPV: 8.1 fL (ref 0–99.8)
PLATELET COUNT, POC: 272 10*3/uL (ref 142–424)
POC Granulocyte: 5.3 (ref 2–6.9)
POC LYMPH %: 26.7 % (ref 10–50)
RBC: 4.6 M/uL — AB (ref 4.69–6.13)
RDW, POC: 12.9 %
WBC: 7.5 10*3/uL (ref 4.6–10.2)

## 2014-04-14 LAB — POCT URINALYSIS DIPSTICK
Bilirubin, UA: NEGATIVE
GLUCOSE UA: NEGATIVE
Ketones, UA: NEGATIVE
Leukocytes, UA: NEGATIVE
Nitrite, UA: NEGATIVE
PH UA: 7
PROTEIN UA: NEGATIVE
RBC UA: NEGATIVE
Spec Grav, UA: 1.01
UROBILINOGEN UA: NEGATIVE

## 2014-04-14 LAB — POCT UA - MICROSCOPIC ONLY
Bacteria, U Microscopic: NEGATIVE
CASTS, UR, LPF, POC: NEGATIVE
CRYSTALS, UR, HPF, POC: NEGATIVE
Mucus, UA: NEGATIVE
RBC, URINE, MICROSCOPIC: NEGATIVE
WBC, Ur, HPF, POC: NEGATIVE
Yeast, UA: NEGATIVE

## 2014-04-14 LAB — POCT GLYCOSYLATED HEMOGLOBIN (HGB A1C): HEMOGLOBIN A1C: 6.1

## 2014-04-14 NOTE — Progress Notes (Signed)
Subjective:    Patient ID: Jimmy Perez., male    DOB: 1965/12/25, 48 y.o.   MRN: [id number]  HPI Patient is here today for annual wellness exam and follow up of chronic medical problems.         Patient Active Problem List   Diagnosis Date Noted  . HTN (hypertension) 04/18/2013  . Hyperlipidemia 04/18/2013  . GERD (gastroesophageal reflux disease) 04/18/2013  . Obesity, unspecified 04/18/2013   Outpatient Encounter Prescriptions as of 04/14/2014  Medication Sig  . acetaminophen (TYLENOL) 500 MG tablet Take 500 mg by mouth every 6 (six) hours as needed for pain.  Marland Kitchen amLODipine (NORVASC) 5 MG tablet Take 1 tablet (5 mg total) by mouth daily.  . diazepam (VALIUM) 10 MG tablet Take 10 mg by mouth at bedtime as needed for anxiety.  Marland Kitchen esomeprazole (NEXIUM) 40 MG capsule Take 1 capsule (40 mg total) by mouth daily.  Marland Kitchen losartan (COZAAR) 50 MG tablet Take 1 tablet (50 mg total) by mouth daily.  . rosuvastatin (CRESTOR) 5 MG tablet Take 5 mg by mouth daily.  . [DISCONTINUED] amLODipine-olmesartan (AZOR) 5-20 MG per tablet Take 5-20 tablets by mouth daily.  . [DISCONTINUED] doxycycline (VIBRA-TABS) 100 MG tablet Take 1 tablet (100 mg total) by mouth 2 (two) times daily.  . [DISCONTINUED] traMADol-acetaminophen (ULTRACET) 37.5-325 MG per tablet Take 1 tablet by mouth every 6 (six) hours as needed.    Review of Systems  Constitutional: Negative.   HENT: Negative.   Eyes: Negative.   Respiratory: Negative.   Cardiovascular: Negative.   Gastrointestinal: Negative.   Endocrine: Negative.   Genitourinary: Negative.   Musculoskeletal: Negative.   Skin: Negative.        Recheck tick bites-already taken DOXY  Allergic/Immunologic: Negative.   Neurological: Negative.   Hematological: Negative.   Psychiatric/Behavioral: Negative.        Objective:   Physical Exam  Nursing note and vitals reviewed. Constitutional: He is oriented to person, place, and time. He appears  well-developed and well-nourished. No distress.  HENT:  Head: Normocephalic and atraumatic.  Right Ear: External ear normal.  Left Ear: External ear normal.  Mouth/Throat: Oropharynx is clear and moist. No oropharyngeal exudate.  Nasal congestion right side  Eyes: Conjunctivae and EOM are normal. Pupils are equal, round, and reactive to light. Right eye exhibits no discharge. Left eye exhibits no discharge. No scleral icterus.  Neck: Normal range of motion. Neck supple. No thyromegaly present.  Cardiovascular: Normal rate, regular rhythm, normal heart sounds and intact distal pulses.  Exam reveals no gallop and no friction rub.   No murmur heard. At 60 per minute  Pulmonary/Chest: Effort normal and breath sounds normal. No respiratory distress. He has no wheezes. He has no rales. He exhibits no tenderness.  Abdominal: Soft. Bowel sounds are normal. He exhibits no mass. There is no tenderness. There is no rebound and no guarding.  Obesity  Genitourinary: Rectum normal and penis normal.  Prostate was slightly enlarged without lumps or masses. There was no inguinal hernia palpated. There were no inguinal nodes. The external genitalia were normal. There were no rectal masses.  Musculoskeletal: Normal range of motion. He exhibits no edema and no tenderness.  Lymphadenopathy:    He has no cervical adenopathy.  Neurological: He is alert and oriented to person, place, and time. He has normal reflexes. No cranial nerve deficit.  Skin: Skin is warm and dry. Rash noted. No erythema. No pallor.  Resolving papules from tick bites  Psychiatric: He has a normal mood and affect. His behavior is normal. Judgment and thought content normal.   BP 156/87  Pulse 67  Temp(Src) 97.5 F (36.4 C) (Oral)  Ht 5' 7.5" (1.715 m)  Wt 242 lb (109.77 kg)  BMI 37.32 kg/m2   [facility] reading (PRIMARY) by  Dr.Moore-chest x-ray-no active disease                                       Assessment & Plan:   1. GERD  (gastroesophageal reflux disease) - POCT CBC  2. HTN (hypertension) - POCT CBC - BMP8+EGFR - Hepatic function panel - DG Chest 2 View; Future - EKG 12-Lead - Ambulatory referral to Cardiology  3. Hyperlipidemia - POCT CBC - Lipid panel - EKG 12-Lead - Ambulatory referral to Cardiology  4. Vitamin D deficiency - Vit D  25 hydroxy (rtn osteoporosis monitoring)  5. Annual physical exam - POCT urinalysis dipstick - POCT UA - Microscopic Only - PSA, total and free - POCT glycosylated hemoglobin (Hb A1C) - DG Chest 2 View; Future - EKG 12-Lead  6. Metabolic syndrome - POCT glycosylated hemoglobin (Hb A1C) - Ambulatory referral to Cardiology  7. Family history of heart disease - Ambulatory referral to Cardiology  Meds ordered this encounter  Medications  . rosuvastatin (CRESTOR) 5 MG tablet    Sig: Take 5 mg by mouth daily.   Patient Instructions  Continue current medications. Continue good therapeutic lifestyle changes which include good diet and exercise. Fall precautions discussed with patient. If an FOBT was given today- please return it to our front desk. If you are over 46 years old - you may need Prevnar 48 or the adult Pneumonia vaccine.  Drink more water Discontinued carbonated beverages Keep appointment with the clinical pharmacist to help you with your diet Monitor blood pressures closely Decrease sodium intake Eat less bread   Arrie Senate MD

## 2014-04-14 NOTE — Patient Instructions (Addendum)
Continue current medications. Continue good therapeutic lifestyle changes which include good diet and exercise. Fall precautions discussed with patient. If an FOBT was given today- please return it to our front desk. If you are over 47 years old - you may need Prevnar 71 or the adult Pneumonia vaccine.  Drink more water Discontinued carbonated beverages Keep appointment with the clinical pharmacist to help you with your diet Monitor blood pressures closely Decrease sodium intake Eat less bread

## 2014-04-15 ENCOUNTER — Other Ambulatory Visit: Payer: Self-pay | Admitting: Nurse Practitioner

## 2014-04-15 LAB — PSA, TOTAL AND FREE
PSA, Free Pct: 37.5 %
PSA, Free: 0.15 ng/mL
PSA: 0.4 ng/mL (ref 0.0–4.0)

## 2014-04-15 LAB — LIPID PANEL
CHOL/HDL RATIO: 5.6 ratio — AB (ref 0.0–5.0)
Cholesterol, Total: 157 mg/dL (ref 100–199)
HDL: 28 mg/dL — AB (ref >39)
Triglycerides: 490 mg/dL — ABNORMAL HIGH (ref 0–149)

## 2014-04-15 LAB — BMP8+EGFR
BUN/Creatinine Ratio: 16 (ref 9–20)
BUN: 15 mg/dL (ref 6–24)
CO2: 24 mmol/L (ref 18–29)
CREATININE: 0.94 mg/dL (ref 0.76–1.27)
Calcium: 9.2 mg/dL (ref 8.7–10.2)
Chloride: 102 mmol/L (ref 97–108)
GFR calc Af Amer: 111 mL/min/{1.73_m2} (ref >59)
GFR, EST NON AFRICAN AMERICAN: 96 mL/min/{1.73_m2} (ref >59)
Glucose: 116 mg/dL — ABNORMAL HIGH (ref 65–99)
Potassium: 4.6 mmol/L (ref 3.5–5.2)
SODIUM: 142 mmol/L (ref 134–144)

## 2014-04-15 LAB — HEPATIC FUNCTION PANEL
ALT: 31 IU/L (ref 0–44)
AST: 15 IU/L (ref 0–40)
Albumin: 4.5 g/dL (ref 3.5–5.5)
Alkaline Phosphatase: 66 IU/L (ref 39–117)
BILIRUBIN TOTAL: 0.5 mg/dL (ref 0.0–1.2)
Bilirubin, Direct: 0.14 mg/dL (ref 0.00–0.40)
TOTAL PROTEIN: 6.3 g/dL (ref 6.0–8.5)

## 2014-04-15 LAB — VITAMIN D 25 HYDROXY (VIT D DEFICIENCY, FRACTURES): VIT D 25 HYDROXY: 19.9 ng/mL — AB (ref 30.0–100.0)

## 2014-04-15 MED ORDER — FENOFIBRATE 145 MG PO TABS: 145.0000 mg | ORAL_TABLET | Freq: Every day | ORAL | Status: DC

## 2014-04-17 ENCOUNTER — Other Ambulatory Visit: Payer: Self-pay | Admitting: *Deleted

## 2014-04-17 MED ORDER — OMEPRAZOLE 40 MG PO CPDR: 40.0000 mg | DELAYED_RELEASE_CAPSULE | Freq: Every day | ORAL | Status: DC

## 2014-04-17 NOTE — Progress Notes (Signed)
Pt requested rx omeprazole - cheaper than nexium

## 2014-04-27 ENCOUNTER — Telehealth: Payer: Self-pay | Admitting: *Deleted

## 2014-04-27 NOTE — Telephone Encounter (Signed)
MM, Ins will not cover fenofibrate 145mg  unless pt is intolerant to or has failed therapy with any of the alternatives covered by their plan, they are fenofibrate 54mg ,,160mg  (generic [place]). Thanks for your help.

## 2014-05-01 ENCOUNTER — Other Ambulatory Visit: Payer: Self-pay | Admitting: Nurse Practitioner

## 2014-05-01 MED ORDER — FENOFIBRATE 160 MG PO TABS: 160.0000 mg | ORAL_TABLET | Freq: Every day | ORAL | Status: DC

## 2014-05-01 NOTE — Telephone Encounter (Signed)
Changed to fenofibrate 160mg - let patient know rx sent to pharmacy

## 2014-05-10 ENCOUNTER — Ambulatory Visit: Payer: 59 | Admitting: Family Medicine

## 2014-05-29 ENCOUNTER — Encounter: Payer: Self-pay | Admitting: Family Medicine

## 2014-05-29 ENCOUNTER — Ambulatory Visit (INDEPENDENT_AMBULATORY_CARE_PROVIDER_SITE_OTHER): Payer: 59 | Admitting: Family Medicine

## 2014-05-29 VITALS — BP 122/85 | HR 81 | Temp 97.0°F | Ht 67.5 in | Wt 249.0 lb

## 2014-05-29 DIAGNOSIS — R0609 Other forms of dyspnea: Secondary | ICD-10-CM

## 2014-05-29 DIAGNOSIS — M47817 Spondylosis without myelopathy or radiculopathy, lumbosacral region: Secondary | ICD-10-CM

## 2014-05-29 DIAGNOSIS — R0683 Snoring: Secondary | ICD-10-CM

## 2014-05-29 DIAGNOSIS — I10 Essential (primary) hypertension: Secondary | ICD-10-CM

## 2014-05-29 DIAGNOSIS — M47816 Spondylosis without myelopathy or radiculopathy, lumbar region: Secondary | ICD-10-CM

## 2014-05-29 DIAGNOSIS — R1310 Dysphagia, unspecified: Secondary | ICD-10-CM

## 2014-05-29 DIAGNOSIS — R0989 Other specified symptoms and signs involving the circulatory and respiratory systems: Secondary | ICD-10-CM

## 2014-05-29 NOTE — Patient Instructions (Addendum)
Continue blood pressure medications as currently doing Continue to work on losing weight Continue to watch her sodium intake Monitor  blood pressures closely and bring readings  for review in 4 weeks Continue to drink plenty of water We will arrange for a sleep apnea evaluation and an endoscopy

## 2014-05-29 NOTE — Progress Notes (Signed)
Subjective:    Patient ID: Jimmy Loretha Stapler., male    DOB: 11-21-1965, 48 y.o.   MRN: [id number]  HPI Patient here today for 1 month follow up on BP and he also has some on-going back pain. The x-rays from September of 2014 were reviewed and the patient had minimal degenerative disc disease changes and scoliosis. The patient has not checked her blood pressure regularly at home. The few times it has been checked it's been between 1:30 to 148/80-98. Since the last visit the blood pressure was split into 2 different medications but there was no increase in the milligrams of either medication . The patient's wife comes with him to the visit today and she indicates that the patient is restless at night and has choking spells sometimes when he is sleeping. The patient also indicates that sometimes he gets strangled easily and that the food does not want to go down completely.        Patient Active Problem List   Diagnosis Date Noted  . Metabolic syndrome 31/49/7026  . HTN (hypertension) 04/18/2013  . Hyperlipidemia 04/18/2013  . GERD (gastroesophageal reflux disease) 04/18/2013  . Obesity, unspecified 04/18/2013   Outpatient Encounter Prescriptions as of 05/29/2014  Medication Sig  . acetaminophen (TYLENOL) 500 MG tablet Take 500 mg by mouth every 6 (six) hours as needed for pain.  Marland Kitchen amLODipine (NORVASC) 5 MG tablet Take 1 tablet (5 mg total) by mouth daily.  . cholecalciferol (VITAMIN D) 1000 UNITS tablet Take 1,000 Units by mouth daily.  . diazepam (VALIUM) 10 MG tablet Take 10 mg by mouth at bedtime as needed for anxiety.  Marland Kitchen esomeprazole (NEXIUM) 40 MG capsule Take 1 capsule (40 mg total) by mouth daily.  . fenofibrate 160 MG tablet Take 1 tablet (160 mg total) by mouth daily.  Marland Kitchen losartan (COZAAR) 50 MG tablet Take 1 tablet (50 mg total) by mouth daily.  Marland Kitchen omeprazole (PRILOSEC) 40 MG capsule Take 1 capsule (40 mg total) by mouth daily.  . rosuvastatin (CRESTOR) 5 MG tablet Take 5 mg by  mouth daily.    Review of Systems  Constitutional: Negative.   HENT: Negative.   Eyes: Negative.   Respiratory: Negative.   Cardiovascular: Negative.   Gastrointestinal: Negative.   Endocrine: Negative.   Genitourinary: Negative.   Musculoskeletal: Positive for back pain (low back pain- worse in the afternoon after work).  Skin: Negative.   Allergic/Immunologic: Negative.   Neurological: Negative.   Hematological: Negative.   Psychiatric/Behavioral: Negative.        Objective:   Physical Exam  Nursing note and vitals reviewed. Constitutional: He is oriented to person, place, and time. He appears well-developed and well-nourished. No distress.  HENT:  Head: Normocephalic and atraumatic.  Mouth/Throat: Oropharynx is clear and moist. No oropharyngeal exudate.  Eyes: Conjunctivae and EOM are normal. Pupils are equal, round, and reactive to light. Right eye exhibits no discharge. Left eye exhibits no discharge. No scleral icterus.  Neck: Normal range of motion. Neck supple. No thyromegaly present.  No carotid bruits  Cardiovascular: Normal rate, regular rhythm and normal heart sounds.  Exam reveals no gallop and no friction rub.   No murmur heard. At 72 per min  Pulmonary/Chest: Effort normal and breath sounds normal. No respiratory distress. He has no wheezes. He has no rales. He exhibits no tenderness.  Abdominal: Soft. Bowel sounds are normal. He exhibits no mass. There is no tenderness. There is no rebound and no guarding.  Obesity  Musculoskeletal: Normal range of motion. He exhibits no edema and no tenderness.  Lymphadenopathy:    He has no cervical adenopathy.  Neurological: He is alert and oriented to person, place, and time. No cranial nerve deficit.  Skin: Skin is warm and dry. No rash noted.  Psychiatric: He has a normal mood and affect. His behavior is normal. Judgment and thought content normal.   BP 139/85  Pulse 81  Temp(Src) 97 F (36.1 C) (Oral)  Ht 5' 7.5"  (1.715 m)  Wt 249 lb (112.946 kg)  BMI 38.40 kg/m2  Repeat blood pressure 122/85      Assessment & Plan:  1. Snoring - Ambulatory referral to Pulmonology  2. Essential hypertension -Continue current medication -Review blood pressures in 4 weeks  3. Difficulty swallowing -Evaluate with endoscopy  4. 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

## 2014-05-30 NOTE — Addendum Note (Signed)
Addended by: Zannie Cove on: 05/30/2014 08:07 AM   Modules accepted: Orders

## 2014-05-31 ENCOUNTER — Encounter: Payer: Self-pay | Admitting: Internal Medicine

## 2014-06-16 ENCOUNTER — Institutional Professional Consult (permissible substitution): Payer: 59 | Admitting: Internal Medicine

## 2014-07-14 ENCOUNTER — Institutional Professional Consult (permissible substitution): Payer: 59 | Admitting: Internal Medicine

## 2014-07-17 ENCOUNTER — Institutional Professional Consult (permissible substitution): Payer: 59 | Admitting: Internal Medicine

## 2014-08-02 ENCOUNTER — Ambulatory Visit: Payer: 59 | Admitting: Internal Medicine

## 2014-08-14 ENCOUNTER — Ambulatory Visit (INDEPENDENT_AMBULATORY_CARE_PROVIDER_SITE_OTHER): Payer: 59 | Admitting: Family Medicine

## 2014-08-14 ENCOUNTER — Encounter: Payer: Self-pay | Admitting: Family Medicine

## 2014-08-14 VITALS — BP 121/80 | HR 83 | Temp 98.6°F | Ht 67.5 in | Wt 244.2 lb

## 2014-08-14 DIAGNOSIS — K5289 Other specified noninfective gastroenteritis and colitis: Secondary | ICD-10-CM

## 2014-08-14 MED ORDER — ONDANSETRON 8 MG PO TBDP: 8.0000 mg | ORAL_TABLET | Freq: Three times a day (TID) | ORAL | Status: DC | PRN

## 2014-08-14 MED ORDER — HYDROCODONE-ACETAMINOPHEN 5-325 MG PO TABS: 1.0000 | ORAL_TABLET | Freq: Four times a day (QID) | ORAL | Status: DC | PRN

## 2014-08-14 MED ORDER — CIPROFLOXACIN HCL 500 MG PO TABS: 500.0000 mg | ORAL_TABLET | Freq: Two times a day (BID) | ORAL | Status: DC

## 2014-08-14 NOTE — Progress Notes (Signed)
   Subjective:    Patient ID: Jimmy Loretha Stapler., male    DOB: Mar 25, 1966, 48 y.o.   MRN: [id number]  HPI C/o N/V/D for one day.  He is achy and having some abdominal pain.  He states he ate some bad seafood from [facility] and developed NVD and fever 45 minutes afterwards.  He has been having diarrhea and vomiting today and was told to go home from work by his employer.   Review of Systems No chest pain, SOB, HA, dizziness, vision change, N/V, diarrhea, constipation, dysuria, urinary urgency or frequency, myalgias, arthralgias or rash.     Objective:   Physical Exam  Vital signs noted  Well developed well nourished male.  HEENT - Head atraumatic Normocephalic                Eyes - PERRLA, Conjuctiva - clear Sclera- Clear EOMI                Ears - EAC's Wnl TM's Wnl Gross Hearing WNL                Throat - oropharanx wnl Respiratory - Lungs CTA bilateral Cardiac - RRR S1 and S2 without murmur GI - Abdomen soft Nontender and bowel sounds active x 4 Extremities - No edema. Neuro - Grossly intact.      Assessment & Plan:  Other noninfectious gastroenteritis - Plan: HYDROcodone-acetaminophen (NORCO) 5-325 MG per tablet, ciprofloxacin (CIPRO) 500 MG tablet, ondansetron (ZOFRAN ODT) 8 MG disintegrating tablet  Lysbeth Penner FNP

## 2014-08-22 ENCOUNTER — Institutional Professional Consult (permissible substitution): Payer: 59 | Admitting: Internal Medicine

## 2014-09-04 ENCOUNTER — Institutional Professional Consult (permissible substitution): Payer: 59 | Admitting: Internal Medicine

## 2014-09-04 ENCOUNTER — Ambulatory Visit (INDEPENDENT_AMBULATORY_CARE_PROVIDER_SITE_OTHER): Payer: 59 | Admitting: *Deleted

## 2014-09-04 DIAGNOSIS — Z23 Encounter for immunization: Secondary | ICD-10-CM

## 2014-09-12 ENCOUNTER — Ambulatory Visit: Payer: 59

## 2014-10-10 ENCOUNTER — Ambulatory Visit: Payer: 59 | Admitting: Internal Medicine

## 2014-10-23 ENCOUNTER — Institutional Professional Consult (permissible substitution): Payer: 59 | Admitting: Internal Medicine

## 2014-10-27 HISTORY — PX: COLONOSCOPY: SHX174

## 2014-10-30 ENCOUNTER — Institutional Professional Consult (permissible substitution): Payer: 59 | Admitting: Interventional Cardiology

## 2014-11-06 ENCOUNTER — Encounter: Payer: Self-pay | Admitting: Internal Medicine

## 2014-11-06 ENCOUNTER — Ambulatory Visit (INDEPENDENT_AMBULATORY_CARE_PROVIDER_SITE_OTHER): Payer: 59 | Admitting: Internal Medicine

## 2014-11-06 VITALS — BP 124/74 | HR 72 | Ht 68.0 in | Wt 253.6 lb

## 2014-11-06 DIAGNOSIS — G4733 Obstructive sleep apnea (adult) (pediatric): Secondary | ICD-10-CM

## 2014-11-06 DIAGNOSIS — E669 Obesity, unspecified: Secondary | ICD-10-CM

## 2014-11-06 NOTE — Assessment & Plan Note (Signed)
Significantly overweight. This will increased probability of sleep disordered breathing. Weight loss recommended.

## 2014-11-06 NOTE — Progress Notes (Signed)
11/06/14- 61 yoM nonsmoker referred for sleep evaluation courtesy of Dr Wonda Horner sleep study. Snoring, stops breathing through the night (per wife) and alot of moving around. Wife here. Snoring has been worse for the last year or so as he has gained weight. Usual bedtime around 10:30 PM, sleep latency 10 minutes, occasionally wakes during the night before up around 6:30. No ENT surgery, heart or lung disease. Treated for high blood pressure. Little caffeine, no sleep med. [place] 11/24  Prior to Admission medications   Medication Sig Start Date End Date Taking? Authorizing Provider  acetaminophen (TYLENOL) 500 MG tablet Take 500 mg by mouth every 6 (six) hours as needed for pain.   Yes Historical Provider, MD  amLODipine (NORVASC) 5 MG tablet Take 1 tablet (5 mg total) by mouth daily. 04/07/14  Yes Lysbeth Penner, FNP  cholecalciferol (VITAMIN D) 1000 UNITS tablet Take 1,000 Units by mouth daily.   Yes Historical Provider, MD  diazepam (VALIUM) 10 MG tablet Take 10 mg by mouth at bedtime as needed for anxiety.   Yes Historical Provider, MD  esomeprazole (NEXIUM) 40 MG capsule Take 1 capsule (40 mg total) by mouth daily. 04/18/13  Yes Lodema Pilot, PA-C  fenofibrate 160 MG tablet Take 1 tablet (160 mg total) by mouth daily. 05/01/14  Yes Mary-Margaret Hassell Done, FNP  losartan (COZAAR) 50 MG tablet Take 1 tablet (50 mg total) by mouth daily. 04/07/14  Yes Lysbeth Penner, FNP  omeprazole (PRILOSEC) 40 MG capsule Take 1 capsule (40 mg total) by mouth daily. 04/17/14  Yes [name], MD  rosuvastatin (CRESTOR) 5 MG tablet Take 5 mg by mouth daily.   Yes Historical Provider, MD   Past Medical History  Diagnosis Date  . GERD (gastroesophageal reflux disease)   . Hypertension   . Hyperlipidemia    Past Surgical History  Procedure Laterality Date  . Knee arthroscopy  11/28/95    left [name])  . Hand surgery     Family History  Problem Relation Age of Onset  . Diabetes Mother   .  Hyperlipidemia Father   . Heart disease Father   . Hypertension Father   . Diabetes Father   . Diabetes Sister   . Hypertension Sister   . Hyperlipidemia Brother   . Hypertension Brother    History   Social History  . Marital Status: Married    Spouse Name: Tye [place]     Number of Children: 2  . Years of Education: N/A   Occupational History  . Financial trader    Social History Main Topics  . Smoking status: Never Smoker   . Smokeless tobacco: Former Systems developer    Types: [place] date: 01/12/2011     Comment: off an on (pouches) of chew  . Alcohol Use: 3.6 oz/week    6 Cans of beer per week  . Drug Use: No  . Sexual Activity: Not on file   Other Topics Concern  . Not on file   Social History Narrative   ROS-see HPI Constitutional:   No-   weight loss, night sweats, fevers, chills, +fatigue, lassitude. HEENT:   No-  headaches, difficulty swallowing, tooth/dental problems, sore throat,       No-  sneezing, itching, ear ache, nasal congestion, post nasal drip,  CV:  No-   chest pain, orthopnea, PND, swelling in lower extremities, anasarca,  dizziness, palpitations Resp: No-   shortness of breath with exertion or at rest.              No-   productive cough,  No non-productive cough,  No- coughing up of blood.              No-   change in color of mucus.  No- wheezing.   Skin: No-   rash or lesions. GI:  +heartburn, indigestion, No-abdominal pain, nausea, vomiting, diarrhea,                 change in bowel habits, loss of appetite GU: No-   dysuria, change in color of urine, no urgency or frequency.  No- flank pain. MS:  No-   joint pain or swelling.  No- decreased range of motion.  No- back pain. Neuro-     nothing unusual Psych:  No- change in mood or affect. No depression or anxiety.  No memory loss.  OBJ- Physical Exam     +obese General- Alert, Oriented, Affect-appropriate, Distress- none acute Skin- rash-none, lesions- none,  excoriation- none Lymphadenopathy- none Head- atraumatic            Eyes- Gross vision intact, PERRLA, conjunctivae and secretions clear            Ears- Hearing, canals-normal            Nose- Clear, no-Septal dev, mucus, polyps, erosion, perforation             Throat- Mallampati IV , mucosa clear , drainage- none, tonsils- atrophic Neck- flexible , trachea midline, no stridor , thyroid nl, carotid no bruit Chest - symmetrical excursion , unlabored           Heart/CV- RRR , no murmur , no gallop  , no rub, nl s1 s2                           - JVD- none , edema- none, stasis changes- none, varices- none           Lung- clear to P&A, wheeze- none, cough- none , dullness-none, rub- none           Chest wall-  Abd- tender-no, distended-no, bowel sounds-present, HSM- no Br/ Gen/ Rectal- Not done, not indicated Extrem- cyanosis- none, clubbing, none, atrophy- none, strength- nl Neuro- grossly intact to observation

## 2014-11-06 NOTE — Assessment & Plan Note (Signed)
High probability that he has significant obstructive sleep apnea. Cannot exclude an additional movement disturbance but I think it is likely that his jerking at night reflects arousals from apnea. Plan-schedule sleep study

## 2014-11-06 NOTE — Patient Instructions (Addendum)
Order- schedule unattended sleep study    Dx OSA  Return after sleep study to go over results

## 2014-11-07 ENCOUNTER — Encounter: Payer: Self-pay | Admitting: Interventional Cardiology

## 2014-11-29 ENCOUNTER — Telehealth: Payer: Self-pay | Admitting: *Deleted

## 2014-11-29 ENCOUNTER — Other Ambulatory Visit: Payer: Self-pay | Admitting: Family Medicine

## 2014-11-29 MED ORDER — CEPHALEXIN 500 MG PO CAPS: 500.0000 mg | ORAL_CAPSULE | Freq: Four times a day (QID) | ORAL | Status: DC

## 2014-11-29 NOTE — Telephone Encounter (Signed)
Patients toe is still infected can we please refill keflex.

## 2014-11-30 NOTE — Telephone Encounter (Signed)
rx sent to pharmacy and patient aware

## 2014-12-06 ENCOUNTER — Encounter: Payer: Self-pay | Admitting: Family Medicine

## 2014-12-06 ENCOUNTER — Ambulatory Visit (INDEPENDENT_AMBULATORY_CARE_PROVIDER_SITE_OTHER): Payer: 59 | Admitting: Family Medicine

## 2014-12-06 ENCOUNTER — Other Ambulatory Visit: Payer: Self-pay | Admitting: *Deleted

## 2014-12-06 VITALS — BP 126/81 | HR 79 | Temp 98.6°F | Ht 68.0 in | Wt 248.0 lb

## 2014-12-06 DIAGNOSIS — M545 Low back pain, unspecified: Secondary | ICD-10-CM

## 2014-12-06 DIAGNOSIS — J310 Chronic rhinitis: Secondary | ICD-10-CM

## 2014-12-06 DIAGNOSIS — L03031 Cellulitis of right toe: Secondary | ICD-10-CM

## 2014-12-06 DIAGNOSIS — Z8249 Family history of ischemic heart disease and other diseases of the circulatory system: Secondary | ICD-10-CM

## 2014-12-06 DIAGNOSIS — R1013 Epigastric pain: Secondary | ICD-10-CM

## 2014-12-06 DIAGNOSIS — J329 Chronic sinusitis, unspecified: Secondary | ICD-10-CM

## 2014-12-06 DIAGNOSIS — K219 Gastro-esophageal reflux disease without esophagitis: Secondary | ICD-10-CM

## 2014-12-06 DIAGNOSIS — I10 Essential (primary) hypertension: Secondary | ICD-10-CM

## 2014-12-06 DIAGNOSIS — E559 Vitamin D deficiency, unspecified: Secondary | ICD-10-CM

## 2014-12-06 DIAGNOSIS — G4733 Obstructive sleep apnea (adult) (pediatric): Secondary | ICD-10-CM

## 2014-12-06 NOTE — Progress Notes (Signed)
Subjective:    Patient ID: Jimmy Perez., male    DOB: September 26, 1966, 49 y.o.   MRN: [id number]  HPI Pt here for follow up and management of chronic medical problems which include GERD and hyperlipidemia. He is taking medications regularly. The patient has been monitoring his blood pressures at home and they have been averaging in the 130s over the 70s. He has been to see the pulmonologist and is waiting to do a sleep apnea evaluation study at home. He continues to have low back pain. He continues to have problems with his swallowing and a visit with the gastroenterologist is coming up soon for possible endoscopy. He is currently taking cephalexin for a wound on his right great toe. He also has had some URI symptoms which are improving.         Patient Active Problem List   Diagnosis Date Noted  . Obstructive sleep apnea 11/06/2014  . Metabolic syndrome 40/98/1191  . HTN (hypertension) 04/18/2013  . Hyperlipidemia 04/18/2013  . GERD (gastroesophageal reflux disease) 04/18/2013  . Obesity 04/18/2013   Outpatient Encounter Prescriptions as of 12/06/2014  Medication Sig  . acetaminophen (TYLENOL) 500 MG tablet Take 500 mg by mouth every 6 (six) hours as needed for pain.  Marland Kitchen amLODipine (NORVASC) 5 MG tablet Take 1 tablet (5 mg total) by mouth daily.  . cephALEXin (KEFLEX) 500 MG capsule Take 1 capsule (500 mg total) by mouth 4 (four) times daily.  . cholecalciferol (VITAMIN D) 1000 UNITS tablet Take 1,000 Units by mouth daily.  . diazepam (VALIUM) 10 MG tablet Take 10 mg by mouth at bedtime as needed for anxiety.  Marland Kitchen esomeprazole (NEXIUM) 40 MG capsule Take 1 capsule (40 mg total) by mouth daily.  Marland Kitchen losartan (COZAAR) 50 MG tablet Take 1 tablet (50 mg total) by mouth daily.  Marland Kitchen omeprazole (PRILOSEC) 40 MG capsule Take 1 capsule (40 mg total) by mouth daily.  . rosuvastatin (CRESTOR) 5 MG tablet Take 5 mg by mouth daily.  . [DISCONTINUED] fenofibrate 160 MG tablet Take 1 tablet (160 mg  total) by mouth daily.    Review of Systems  Constitutional: Negative.   HENT: Positive for congestion, postnasal drip and sinus pressure (better now).   Eyes: Negative.   Respiratory: Negative.   Cardiovascular: Negative.   Gastrointestinal: Negative.   Endocrine: Negative.   Genitourinary: Negative.   Musculoskeletal: Negative.   Skin: Positive for wound (right great toe - on keflex now).  Allergic/Immunologic: Negative.   Neurological: Negative.   Hematological: Negative.   Psychiatric/Behavioral: Negative.        Objective:   Physical Exam  Constitutional: He is oriented to person, place, and time. He appears well-developed and well-nourished. No distress.  HENT:  Head: Normocephalic and atraumatic.  Right Ear: External ear normal.  Left Ear: External ear normal.  Mouth/Throat: Oropharynx is clear and moist. No oropharyngeal exudate.  Nasal congestion bilaterally and some ethmoid sinus tenderness  Eyes: Conjunctivae and EOM are normal. Pupils are equal, round, and reactive to light. Right eye exhibits no discharge. Left eye exhibits no discharge. No scleral icterus.  Neck: Normal range of motion. Neck supple. No thyromegaly present.  No anterior cervical nodes or thyromegaly  Cardiovascular: Normal rate, regular rhythm, normal heart sounds and intact distal pulses.  Exam reveals no gallop and no friction rub.   No murmur heard. The heart has a regular rate and rhythm at 72/m  Pulmonary/Chest: Effort normal and breath sounds normal. No respiratory distress.  He has no wheezes. He has no rales. He exhibits no tenderness.  The lungs are clear anteriorly and posteriorly and minimal congestion with coughing  Abdominal: Soft. Bowel sounds are normal. He exhibits no mass. There is tenderness. There is no rebound and no guarding.  The abdomen was obese without masses but with some epigastric tenderness  Musculoskeletal: Normal range of motion. He exhibits no edema or tenderness.    Lymphadenopathy:    He has no cervical adenopathy.  Neurological: He is alert and oriented to person, place, and time. He has normal reflexes. No cranial nerve deficit.  Skin: Skin is warm and dry. No rash noted. No erythema. No pallor.  There was minimal redness of the right great toe nail.  Psychiatric: He has a normal mood and affect. His behavior is normal. Judgment and thought content normal.  Nursing note and vitals reviewed.  BP 126/81 mmHg  Pulse 79  Temp(Src) 98.6 F (37 C) (Oral)  Ht '5\' 8"'  (1.727 m)  Wt 248 lb (112.492 kg)  BMI 37.72 kg/m2        Assessment & Plan:  1. Essential hypertension -continue diet habits and sodium restriction - POCT CBC; Future - BMP8+EGFR; Future - Hepatic function panel; Future - Lipid panel; Future  2. Gastroesophageal reflux disease, esophagitis presence not specified -continue diet habits, caffeine restriction. And omeprazole - POCT CBC; Future - Hepatic function panel; Future  3. Vitamin D deficiency -Continue current treatment with vitamin D3 and we will adjust the medication her lab work will be drawn. - POCT CBC; Future - Vit D  25 hydroxy (rtn osteoporosis monitoring); Future  4. Family history of heart disease -Continue aggressive therapeutic lifestyle changes regarding weight sodium restriction and low cholesterol diet - POCT CBC; Future - BMP8+EGFR; Future - Hepatic function panel; Future - Lipid panel; Future  5. Midline low back pain without sciatica -Continue with regular walking habits and weight loss  6. Obstructive sleep apnea -Follow through with sleep apnea evaluation at home  7. Abdominal pain, epigastric -Continue with omeprazole and visit with gastroenterology  8. Infected toe -Continue and complete Keflex  9. URI/rhinosinusitis -Continue and complete Keflex and use saline nose spray and use Mucinex if needed for cough and congestion  Patient Instructions  Continue current  medications. Continue good therapeutic lifestyle changes which include good diet and exercise. Fall precautions discussed with patient. If an FOBT was given today- please return it to our front desk. If you are over 71 years old - you may need Prevnar 62 or the adult Pneumonia vaccine.  Flu Shots are still available at our office. If you still haven't had one please call to set up a nurse visit to get one.   After your visit with [place] today you will receive a survey in the mail or online from [employer] regarding your care with [place]. Please take a moment to fill this out. Your feedback is very important to [place] as you can help [place] better understand your patient needs as well as improve your experience and satisfaction. WE CARE ABOUT YOU!!!    continue to drink plenty of fluids  Continue to watch your diet closely and your carbs closely  Exercise as much as possible  Reduce sodium intake  Finish antibiotic and use saline nose spray for nasal congestion  Use Mucinex maximum strength if needed for cough 1 twice daily with a large glass of water  Follow through with gastroenterology visit  We will continue to work  on getting the evaluation for sleep apnea at home through the pulmonologist office  Take Tylenol as needed for back pain and walk as much as possible as this will help some of the stiffness from the arthritis  Continue to monitor blood pressures at home   Arrie Senate MD

## 2014-12-06 NOTE — Patient Instructions (Addendum)
Continue current medications. Continue good therapeutic lifestyle changes which include good diet and exercise. Fall precautions discussed with patient. If an FOBT was given today- please return it to our front desk. If you are over 48 years old - you may need Prevnar 30 or the adult Pneumonia vaccine.  Flu Shots are still available at our office. If you still haven't had one please call to set up a nurse visit to get one.   After your visit with [place] today you will receive a survey in the mail or online from [employer] regarding your care with [place]. Please take a moment to fill this out. Your feedback is very important to [place] as you can help [place] better understand your patient needs as well as improve your experience and satisfaction. WE CARE ABOUT YOU!!!    continue to drink plenty of fluids  Continue to watch your diet closely and your carbs closely  Exercise as much as possible  Reduce sodium intake  Finish antibiotic and use saline nose spray for nasal congestion  Use Mucinex maximum strength if needed for cough 1 twice daily with a large glass of water  Follow through with gastroenterology visit  We will continue to work on getting the evaluation for sleep apnea at home through the pulmonologist office  Take Tylenol as needed for back pain and walk as much as possible as this will help some of the stiffness from the arthritis  Continue to monitor blood pressures at home

## 2014-12-11 ENCOUNTER — Encounter: Payer: Self-pay | Admitting: *Deleted

## 2014-12-14 ENCOUNTER — Other Ambulatory Visit (INDEPENDENT_AMBULATORY_CARE_PROVIDER_SITE_OTHER): Payer: 59

## 2014-12-14 DIAGNOSIS — I10 Essential (primary) hypertension: Secondary | ICD-10-CM

## 2014-12-14 DIAGNOSIS — Z8249 Family history of ischemic heart disease and other diseases of the circulatory system: Secondary | ICD-10-CM

## 2014-12-14 DIAGNOSIS — E559 Vitamin D deficiency, unspecified: Secondary | ICD-10-CM

## 2014-12-14 DIAGNOSIS — K219 Gastro-esophageal reflux disease without esophagitis: Secondary | ICD-10-CM

## 2014-12-14 NOTE — Progress Notes (Signed)
Lab only 

## 2014-12-15 LAB — CBC WITH DIFFERENTIAL/PLATELET
Basophils Absolute: 0 10*3/uL (ref 0.0–0.2)
Basos: 0 %
EOS: 2 %
Eosinophils Absolute: 0.2 10*3/uL (ref 0.0–0.4)
HCT: 42.2 % (ref 37.5–51.0)
Hemoglobin: 14.6 g/dL (ref 12.6–17.7)
IMMATURE GRANS (ABS): 0 10*3/uL (ref 0.0–0.1)
Immature Granulocytes: 0 %
LYMPHS: 30 %
Lymphocytes Absolute: 2.5 10*3/uL (ref 0.7–3.1)
MCH: 31.7 pg (ref 26.6–33.0)
MCHC: 34.6 g/dL (ref 31.5–35.7)
MCV: 92 fL (ref 79–97)
MONOCYTES: 9 %
Monocytes Absolute: 0.8 10*3/uL (ref 0.1–0.9)
NEUTROS PCT: 59 %
Neutrophils Absolute: 5 10*3/uL (ref 1.4–7.0)
Platelets: 291 10*3/uL (ref 150–379)
RBC: 4.6 x10E6/uL (ref 4.14–5.80)
RDW: 13.9 % (ref 12.3–15.4)
WBC: 8.4 10*3/uL (ref 3.4–10.8)

## 2014-12-15 LAB — BMP8+EGFR
BUN/Creatinine Ratio: 15 (ref 9–20)
BUN: 13 mg/dL (ref 6–24)
CALCIUM: 9.4 mg/dL (ref 8.7–10.2)
CHLORIDE: 99 mmol/L (ref 97–108)
CO2: 23 mmol/L (ref 18–29)
Creatinine, Ser: 0.84 mg/dL (ref 0.76–1.27)
GFR, EST AFRICAN AMERICAN: 120 mL/min/{1.73_m2} (ref >59)
GFR, EST NON AFRICAN AMERICAN: 104 mL/min/{1.73_m2} (ref >59)
GLUCOSE: 158 mg/dL — AB (ref 65–99)
POTASSIUM: 4.6 mmol/L (ref 3.5–5.2)
Sodium: 141 mmol/L (ref 134–144)

## 2014-12-15 LAB — HEPATIC FUNCTION PANEL
ALK PHOS: 81 IU/L (ref 39–117)
ALT: 35 IU/L (ref 0–44)
AST: 18 IU/L (ref 0–40)
Albumin: 4.8 g/dL (ref 3.5–5.5)
BILIRUBIN TOTAL: 0.3 mg/dL (ref 0.0–1.2)
Bilirubin, Direct: 0.13 mg/dL (ref 0.00–0.40)
TOTAL PROTEIN: 6.6 g/dL (ref 6.0–8.5)

## 2014-12-15 LAB — VITAMIN D 25 HYDROXY (VIT D DEFICIENCY, FRACTURES): Vit D, 25-Hydroxy: 15.2 ng/mL — ABNORMAL LOW (ref 30.0–100.0)

## 2014-12-15 LAB — LIPID PANEL
CHOL/HDL RATIO: 4.7 ratio (ref 0.0–5.0)
Cholesterol, Total: 155 mg/dL (ref 100–199)
HDL: 33 mg/dL — AB (ref >39)
Triglycerides: 434 mg/dL — ABNORMAL HIGH (ref 0–149)

## 2014-12-18 ENCOUNTER — Other Ambulatory Visit: Payer: Self-pay | Admitting: *Deleted

## 2014-12-18 MED ORDER — VITAMIN D (ERGOCALCIFEROL) 1.25 MG (50000 UNIT) PO CAPS: 50000.0000 [IU] | ORAL_CAPSULE | ORAL | Status: DC

## 2014-12-20 ENCOUNTER — Encounter: Payer: Self-pay | Admitting: Family Medicine

## 2014-12-20 LAB — HGB A1C W/O EAG: HEMOGLOBIN A1C: 9 % — AB (ref 4.8–5.6)

## 2014-12-20 LAB — SPECIMEN STATUS REPORT

## 2014-12-22 ENCOUNTER — Ambulatory Visit (INDEPENDENT_AMBULATORY_CARE_PROVIDER_SITE_OTHER): Payer: 59 | Admitting: Internal Medicine

## 2014-12-22 ENCOUNTER — Encounter: Payer: Self-pay | Admitting: Internal Medicine

## 2014-12-22 VITALS — BP 106/72 | HR 72 | Ht 67.0 in | Wt 246.4 lb

## 2014-12-22 DIAGNOSIS — K219 Gastro-esophageal reflux disease without esophagitis: Secondary | ICD-10-CM

## 2014-12-22 DIAGNOSIS — Z8371 Family history of colonic polyps: Secondary | ICD-10-CM

## 2014-12-22 MED ORDER — RANITIDINE HCL 150 MG PO TABS: 150.0000 mg | ORAL_TABLET | Freq: Every evening | ORAL | Status: DC

## 2014-12-22 NOTE — Patient Instructions (Addendum)
You have been scheduled for an endoscopy and colonoscopy. Please follow the written instructions given to you at your visit today. Please pick up your prep supplies at the pharmacy within the next 1-3 days. If you use inhalers (even only as needed), please bring them with you on the day of your procedure. Your physician has requested that you go to www.startemmi.com and enter the access code given to you at your visit today. This web site gives a general overview about your procedure. However, you should still follow specific instructions given to you by our office regarding your preparation for the procedure.  You may have a light breakfast the morning of prep day (the day before the procedure). You may choose from: eggs, toast, chicken noodle soup, crackers.  You should have your breakfast completed between 8:00 and 9:00 am. Clear liquids only for the rest of the day on prep day and up until 3 hours before procedure.   Please continue Nexium 40 mg daily  We have sent the following medications to your pharmacy for you to pick up at your convenience: Ranitidine 150 mg every evening as needed  Please follow a GERD diet (see below):  Gastroesophageal Reflux Disease, Adult Gastroesophageal reflux disease (GERD) happens when acid from your stomach flows up into the esophagus. When acid comes in contact with the esophagus, the acid causes soreness (inflammation) in the esophagus. Over time, GERD may create small holes (ulcers) in the lining of the esophagus. CAUSES   Increased body weight. This puts pressure on the stomach, making acid rise from the stomach into the esophagus.  Smoking. This increases acid production in the stomach.  Drinking alcohol. This causes decreased pressure in the lower esophageal sphincter (valve or ring of muscle between the esophagus and stomach), allowing acid from the stomach into the esophagus.  Late evening meals and a full stomach. This increases pressure and  acid production in the stomach.  A malformed lower esophageal sphincter. Sometimes, no cause is found. SYMPTOMS   Burning pain in the lower part of the mid-chest behind the breastbone and in the mid-stomach area. This may occur twice a week or more often.  Trouble swallowing.  Sore throat.  Dry cough.  Asthma-like symptoms including chest tightness, shortness of breath, or wheezing. DIAGNOSIS  Your caregiver may be able to diagnose GERD based on your symptoms. In some cases, X-rays and other tests may be done to check for complications or to check the condition of your stomach and esophagus. TREATMENT  Your caregiver may recommend over-the-counter or prescription medicines to help decrease acid production. Ask your caregiver before starting or adding any new medicines.  HOME CARE INSTRUCTIONS   Change the factors that you can control. Ask your caregiver for guidance concerning weight loss, quitting smoking, and alcohol consumption.  Avoid foods and drinks that make your symptoms worse, such as:  Caffeine or alcoholic drinks.  Chocolate.  Peppermint or mint flavorings.  Garlic and onions.  Spicy foods.  Citrus fruits, such as oranges, lemons, or limes.  Tomato-based foods such as sauce, chili, salsa, and pizza.  Fried and fatty foods.  Avoid lying down for the 3 hours prior to your bedtime or prior to taking a nap.  Eat small, frequent meals instead of large meals.  Wear loose-fitting clothing. Do not wear anything tight around your waist that causes pressure on your stomach.  Raise the head of your bed 6 to 8 inches with wood blocks to help you sleep. Extra pillows  will not help.  Only take over-the-counter or prescription medicines for pain, discomfort, or fever as directed by your caregiver.  Do not take aspirin, ibuprofen, or other nonsteroidal anti-inflammatory drugs (NSAIDs). SEEK IMMEDIATE MEDICAL CARE IF:   You have pain in your arms, neck, jaw, teeth, or  back.  Your pain increases or changes in intensity or duration.  You develop nausea, vomiting, or sweating (diaphoresis).  You develop shortness of breath, or you faint.  Your vomit is green, yellow, black, or looks like coffee grounds or blood.  Your stool is red, bloody, or black. These symptoms could be signs of other problems, such as heart disease, gastric bleeding, or esophageal bleeding. MAKE SURE YOU:   Understand these instructions.  Will watch your condition.  Will get help right away if you are not doing well or get worse. Document Released: 07/23/2005 Document Revised: 01/05/2012 Document Reviewed: 05/02/2011 [facility] Patient Information 2015 [place], [place]. This information is not intended to replace advice given to you by your health care provider. Make sure you discuss any questions you have with your health care provider.  Food Choices for Gastroesophageal Reflux Disease When you have gastroesophageal reflux disease (GERD), the foods you eat and your eating habits are very important. Choosing the right foods can help ease the discomfort of GERD. WHAT GENERAL GUIDELINES DO I NEED TO FOLLOW?  Choose fruits, vegetables, whole grains, low-fat dairy products, and low-fat meat, fish, and poultry.  Limit fats such as oils, salad dressings, butter, nuts, and avocado.  Keep a food diary to identify foods that cause symptoms.  Avoid foods that cause reflux. These may be different for different people.  Eat frequent small meals instead of three large meals each day.  Eat your meals slowly, in a relaxed setting.  Limit fried foods.  Cook foods using methods other than frying.  Avoid drinking alcohol.  Avoid drinking large amounts of liquids with your meals.  Avoid bending over or lying down until 2-3 hours after eating. WHAT FOODS ARE NOT RECOMMENDED? The following are some foods and drinks that may worsen your symptoms: Vegetables Tomatoes. Tomato juice. Tomato  and spaghetti sauce. Chili peppers. Onion and garlic. Horseradish. Fruits Oranges, grapefruit, and lemon (fruit and juice). Meats High-fat meats, fish, and poultry. This includes hot dogs, ribs, ham, sausage, salami, and bacon. Dairy Whole milk and chocolate milk. Sour cream. Cream. Butter. Ice cream. Cream cheese.  Beverages Coffee and tea, with or without caffeine. Carbonated beverages or energy drinks. Condiments Hot sauce. Barbecue sauce.  Sweets/Desserts Chocolate and cocoa. Donuts. Peppermint and spearmint. Fats and Oils High-fat foods, including [place] fries and potato chips. Other Vinegar. Strong spices, such as black pepper, white pepper, red pepper, cayenne, curry powder, cloves, ginger, and chili powder. The items listed above may not be a complete list of foods and beverages to avoid. Contact your dietitian for more information. Document Released: 10/13/2005 Document Revised: 10/18/2013 Document Reviewed: 08/17/2013 [facility] Patient Information 2015 [place], [place]. This information is not intended to replace advice given to you by your health care provider. Make sure you discuss any questions you have with your health care provider.

## 2014-12-22 NOTE — Progress Notes (Signed)
Patient ID: [name], male   DOB: [birth year], 49 y.o.   MRN: [id number] HPI: Ismaeel Kedar, [place]. is a 49 year old male with a past but no history of GERD, hypertension, hyperlipidemia, vitamin D deficiency and possible sleep apnea who seen in consultation at the request of Dr. Laurance Flatten to discuss GERD and dysphagia. He is here today with his wife. He reports over the last 2 years he's had more severe heartburn, which is almost always occurs at night. This is after he eats large meals, spicy foods, or eats late at night. Symptoms of been somewhat predictable in this way. He denies solid food dysphagia but does report occasional trouble drinking liquids. Liquids feel like they have a hard time transiting into the stomach and occasionally can cause coughing. He denies abdominal pain including no nausea, vomiting, or diarrhea. No odynophagia. Bowel movements a been regular without blood in his stool or melena. He does report a brother who has a history of precancerous colon polyps. His brother is 24 months younger than he.   He is currently using Nexium 40 mg daily. This has helped with his heartburn significantly but he does still occasionally have breakthrough heartburn at night on nights when he overeats, eat spicy foods, or eats late. On those nights he will use vinegar or Tums.  Past Medical History  Diagnosis Date  . GERD (gastroesophageal reflux disease)   . Hypertension   . Hyperlipidemia   . OSA (obstructive sleep apnea)     ?  Marland Kitchen Vitamin D deficiency     Past Surgical History  Procedure Laterality Date  . Knee arthroscopy Right 11/28/95    [name])    Outpatient Prescriptions Prior to Visit  Medication Sig Dispense Refill  . acetaminophen (TYLENOL) 500 MG tablet Take 500 mg by mouth every 6 (six) hours as needed for pain.    Marland Kitchen amLODipine (NORVASC) 5 MG tablet Take 1 tablet (5 mg total) by mouth daily. 90 tablet 3  . cholecalciferol (VITAMIN D) 1000 UNITS tablet Take 1,000 Units  by mouth daily.    . diazepam (VALIUM) 10 MG tablet Take 10 mg by mouth at bedtime as needed for anxiety.    Marland Kitchen esomeprazole (NEXIUM) 40 MG capsule Take 1 capsule (40 mg total) by mouth daily. 90 capsule 3  . losartan (COZAAR) 50 MG tablet Take 1 tablet (50 mg total) by mouth daily. 90 tablet 3  . omeprazole (PRILOSEC) 40 MG capsule Take 1 capsule (40 mg total) by mouth daily. 90 capsule 3  . rosuvastatin (CRESTOR) 5 MG tablet Take 5 mg by mouth daily.    . Vitamin D, Ergocalciferol, (DRISDOL) 50000 UNITS CAPS capsule Take 1 capsule (50,000 Units total) by mouth every 7 (seven) days. 12 capsule 1  . cephALEXin (KEFLEX) 500 MG capsule Take 1 capsule (500 mg total) by mouth 4 (four) times daily. 40 capsule 0   No facility-administered medications prior to visit.    No Known Allergies  Family History  Problem Relation Age of Onset  . Diabetes Mother   . Hyperlipidemia Father   . Heart disease Father   . Hypertension Father   . Diabetes Father   . Diabetes Sister   . Hypertension Sister   . Hyperlipidemia Brother   . Hypertension Brother   . Colon polyps Brother     History  Substance Use Topics  . Smoking status: Never Smoker   . Smokeless tobacco: Current User    Types: Snuff  Comment: off an on (pouches) of snuff  . Alcohol Use: 3.6 oz/week    6 Cans of beer per week     Comment: occasional- depending on the occasion    ROS: As per history of present illness, otherwise negative  BP 106/72 mmHg  Pulse 72  Ht 5\' 7"  (1.702 m)  Wt 246 lb 6 oz (111.755 kg)  BMI 38.58 kg/m2 Constitutional: Well-developed and well-nourished. No distress. HEENT: Normocephalic and atraumatic. Oropharynx is clear and moist. No oropharyngeal exudate. Conjunctivae are normal.  No scleral icterus. Neck: Neck supple. Trachea midline. Cardiovascular: Normal rate, regular rhythm and intact distal pulses. No M/R/G Pulmonary/chest: Effort normal and breath sounds normal. No wheezing, rales or  rhonchi. Abdominal: Soft, obese ,nontender, nondistended. Bowel sounds active throughout. There are no masses palpable.  Extremities: no clubbing, cyanosis, or edema Lymphadenopathy: No cervical adenopathy noted. Neurological: Alert and oriented to person place and time. Skin: Skin is warm and dry. No rashes noted. Psychiatric: Normal mood and affect. Behavior is normal.  RELEVANT LABS AND IMAGING: CBC    Component Value Date/Time   WBC 8.4 12/14/2014 1325   WBC 7.5 04/14/2014 1027   RBC 4.60 12/14/2014 1325   RBC 4.6* 04/14/2014 1027   HGB 14.6 12/14/2014 1325   HGB 14.1 04/14/2014 1027   HCT 42.2 12/14/2014 1325   HCT 42.9* 04/14/2014 1027   PLT 291 12/14/2014 1325   MCV 92 12/14/2014 1325   MCV 93.2 04/14/2014 1027   MCH 31.7 12/14/2014 1325   MCH 30.6 04/14/2014 1027   MCHC 34.6 12/14/2014 1325   MCHC 32.9 04/14/2014 1027   RDW 13.9 12/14/2014 1325   LYMPHSABS 2.5 12/14/2014 1325   EOSABS 0.2 12/14/2014 1325   BASOSABS 0.0 12/14/2014 1325    CMP     Component Value Date/Time   NA 141 12/14/2014 1325   NA 138 04/18/2013 0916   K 4.6 12/14/2014 1325   CL 99 12/14/2014 1325   CO2 23 12/14/2014 1325   GLUCOSE 158* 12/14/2014 1325   GLUCOSE 86 04/18/2013 0916   BUN 13 12/14/2014 1325   BUN 15 04/18/2013 0916   CREATININE 0.84 12/14/2014 1325   CREATININE 0.93 04/18/2013 0916   CALCIUM 9.4 12/14/2014 1325   PROT 6.6 12/14/2014 1325   PROT 6.6 04/18/2013 0916   ALBUMIN 4.5 04/18/2013 0916   AST 18 12/14/2014 1325   ALT 35 12/14/2014 1325   ALKPHOS 81 12/14/2014 1325   BILITOT 0.3 12/14/2014 1325   BILITOT 0.5 04/14/2014 1016   GFRNONAA 104 12/14/2014 1325   GFRNONAA >89 04/18/2013 0916   GFRAA 120 12/14/2014 1325   GFRAA >89 04/18/2013 0916    ASSESSMENT/PLAN: 49 year old male with a past but no history of GERD, hypertension, hyperlipidemia, vitamin D deficiency and possible sleep apnea who seen in consultation at the request of Dr. Laurance Flatten to discuss GERD  and dysphagia.  1. GERD with occasional liquid dysphagia -- liquid dysphagia raises the question of dysmotility possibly secondary to uncontrolled reflux or even eosinophilic esophagitis. Suspicion for stricture or ring low given lack of solid food dysphagia. We discussed the importance of a GERD diet and I've also asked that he avoid overeating and eating late at night. Ideally he should not eat within 2 hours of lying down. We discussed GERD promoting foods such as chocolate, alcohol, peppermint. Copy of GERD diet given.  He will continue Nexium 40 mg daily. Will give him ranitidine 150 mg to use in the evening when he anticipates  eating large meals, eating late at night or eating GERD promoting foods. I recommended upper endoscopy given symptoms and persistent GERD. We discussed the test including the risks and benefits and he is agreeable to proceed. We also discussed how weight loss would very likely improve overall GERD symptoms  2. Family history of colon polyps -- his brother had precancerous colon polyps at a younger age and for this reason I have recommended proceeding to screening colonoscopy at this time. This can be done on the same day as his upper endoscopy. We discussed the risks, benefits, and alternatives to colonoscopy. He understands and is agreeable to proceed    YQ:[id number] Jennette Bill, [place], [place] [address]

## 2014-12-26 DIAGNOSIS — G4733 Obstructive sleep apnea (adult) (pediatric): Secondary | ICD-10-CM

## 2015-01-03 ENCOUNTER — Ambulatory Visit (INDEPENDENT_AMBULATORY_CARE_PROVIDER_SITE_OTHER): Payer: 59 | Admitting: Nurse Practitioner

## 2015-01-03 ENCOUNTER — Encounter: Payer: Self-pay | Admitting: Nurse Practitioner

## 2015-01-03 VITALS — BP 129/78 | HR 68 | Temp 97.0°F | Ht 67.0 in | Wt 246.0 lb

## 2015-01-03 DIAGNOSIS — M5441 Lumbago with sciatica, right side: Secondary | ICD-10-CM | POA: Diagnosis not present

## 2015-01-03 MED ORDER — CYCLOBENZAPRINE HCL 10 MG PO TABS
10.0000 mg | ORAL_TABLET | Freq: Three times a day (TID) | ORAL | Status: DC | PRN
Start: 2015-01-03 — End: 2015-12-17

## 2015-01-03 MED ORDER — NAPROXEN 500 MG PO TABS: 500.0000 mg | ORAL_TABLET | Freq: Two times a day (BID) | ORAL | Status: DC

## 2015-01-03 MED ORDER — METHYLPREDNISOLONE ACETATE 80 MG/ML IJ SUSP
80.0000 mg | Freq: Once | INTRAMUSCULAR | Status: AC
  Administered 2015-01-03: 80 mg via INTRAMUSCULAR

## 2015-01-03 NOTE — Patient Instructions (Signed)

## 2015-01-03 NOTE — Progress Notes (Signed)
  Subjective:    Jimmy Perez. is a 49 y.o. male who presents for evaluation of low back pain. The patient has had recurrent self limited episodes of low back pain in the past. Symptoms have been present for 5 days and are gradually worsening.  Onset was related to / precipitated by no known injury and sitting or laying for longtime. The pain is located in the right lumbar area and does not radiate. The pain is described as sharp and occurs intermittently. He rates his pain as moderate. Symptoms are exacerbated by lying down and sitting. Symptoms are improved by acetaminophen and heat. He has also tried nothing which provided no symptom relief. He has no other symptoms associated with the back pain. The patient has no "red flag" history indicative of complicated back pain.  The following portions of the patient's history were reviewed and updated as appropriate: allergies, current medications, past family history, past medical history, past social history, past surgical history and problem list.  Review of Systems Pertinent items are noted in HPI.    Objective:   Inspection and palpation: no tenderness noted. Muscle tone and ROM exam: muscle tone normal without spasm. Straight leg raise: positive at 45 degrees on the right.    Assessment:    Nonspecific acute low back pain    Plan:    Educational material distributed. Stretching exercises discussed. Heat to affected area as needed for local pain relief. Muscle relaxants per medication orders.    Mary-Margaret Hassell Done, FNP

## 2015-01-09 ENCOUNTER — Other Ambulatory Visit: Payer: Self-pay | Admitting: *Deleted

## 2015-01-09 DIAGNOSIS — G4733 Obstructive sleep apnea (adult) (pediatric): Secondary | ICD-10-CM

## 2015-01-18 ENCOUNTER — Ambulatory Visit (AMBULATORY_SURGERY_CENTER): Payer: 59 | Admitting: Internal Medicine

## 2015-01-18 ENCOUNTER — Encounter: Payer: Self-pay | Admitting: Internal Medicine

## 2015-01-18 VITALS — BP 130/81 | HR 50 | Temp 97.1°F | Resp 30 | Ht 67.0 in | Wt 246.0 lb

## 2015-01-18 DIAGNOSIS — Z1211 Encounter for screening for malignant neoplasm of colon: Secondary | ICD-10-CM | POA: Diagnosis not present

## 2015-01-18 DIAGNOSIS — R1314 Dysphagia, pharyngoesophageal phase: Secondary | ICD-10-CM | POA: Diagnosis not present

## 2015-01-18 DIAGNOSIS — D124 Benign neoplasm of descending colon: Secondary | ICD-10-CM

## 2015-01-18 DIAGNOSIS — K621 Rectal polyp: Secondary | ICD-10-CM

## 2015-01-18 DIAGNOSIS — K219 Gastro-esophageal reflux disease without esophagitis: Secondary | ICD-10-CM | POA: Diagnosis not present

## 2015-01-18 DIAGNOSIS — Z8371 Family history of colonic polyps: Secondary | ICD-10-CM

## 2015-01-18 DIAGNOSIS — D128 Benign neoplasm of rectum: Secondary | ICD-10-CM

## 2015-01-18 MED ORDER — SODIUM CHLORIDE 0.9 % IV SOLN: 500.0000 mL | INTRAVENOUS | Status: DC

## 2015-01-18 NOTE — Patient Instructions (Signed)
Discharge instructions given. Handouts on polyps,diverticulosis,hiatal hernia and Barretts. Resume previous medications. YOU HAD AN ENDOSCOPIC PROCEDURE TODAY AT [facility]:   Refer to the procedure report that was given to you for any specific questions about what was found during the examination.  If the procedure report does not answer your questions, please call your gastroenterologist to clarify.  If you requested that your care partner not be given the details of your procedure findings, then the procedure report has been included in a sealed envelope for you to review at your convenience later.  YOU SHOULD EXPECT: Some feelings of bloating in the abdomen. Passage of more gas than usual.  Walking can help get rid of the air that was put into your GI tract during the procedure and reduce the bloating. If you had a lower endoscopy (such as a colonoscopy or flexible sigmoidoscopy) you may notice spotting of blood in your stool or on the toilet paper. If you underwent a bowel prep for your procedure, you may not have a normal bowel movement for a few days.  Please Note:  You might notice some irritation and congestion in your nose or some drainage.  This is from the oxygen used during your procedure.  There is no need for concern and it should clear up in a day or so.  SYMPTOMS TO REPORT IMMEDIATELY:   Following lower endoscopy (colonoscopy or flexible sigmoidoscopy):  Excessive amounts of blood in the stool  Significant tenderness or worsening of abdominal pains  Swelling of the abdomen that is new, acute  Fever of 100F or higher   Following upper endoscopy (EGD)  Vomiting of blood or coffee ground material  New chest pain or pain under the shoulder blades  Painful or persistently difficult swallowing  New shortness of breath  Fever of 100F or higher  Black, tarry-looking stools  For urgent or emergent issues, a gastroenterologist can be reached at any hour by  calling [phone].   DIET: Your first meal following the procedure should be a small meal and then it is ok to progress to your normal diet. Heavy or fried foods are harder to digest and may make you feel nauseous or bloated.  Likewise, meals heavy in dairy and vegetables can increase bloating.  Drink plenty of fluids but you should avoid alcoholic beverages for 24 hours.  ACTIVITY:  You should plan to take it easy for the rest of today and you should NOT DRIVE or use heavy machinery until tomorrow (because of the sedation medicines used during the test).    FOLLOW UP: Our staff will call the number listed on your records the next business day following your procedure to check on you and address any questions or concerns that you may have regarding the information given to you following your procedure. If we do not reach you, we will leave a message.  However, if you are feeling well and you are not experiencing any problems, there is no need to return our call.  We will assume that you have returned to your regular daily activities without incident.  If any biopsies were taken you will be contacted by phone or by letter within the next 1-3 weeks.  Please call us at [phone] if you have not heard about the biopsies in 3 weeks.    SIGNATURES/CONFIDENTIALITY: You and/or your care partner have signed paperwork which will be entered into your electronic medical record.  These signatures attest to the fact that  that the information above on your After Visit Summary has been reviewed and is understood.  Full responsibility of the confidentiality of this discharge information lies with you and/or your care-partner.

## 2015-01-18 NOTE — Progress Notes (Signed)
To recovery, report to McCoy, RN, VSS 

## 2015-01-18 NOTE — Progress Notes (Signed)
Called to room to assist during endoscopic procedure.  Patient ID and intended procedure confirmed with present staff. Received instructions for my participation in the procedure from the performing physician.  

## 2015-01-18 NOTE — Op Note (Signed)
[facility]  [address] [place], [address]   ENDOSCOPY PROCEDURE REPORT  PATIENT: Perez, Jimmy  MR#: [id number] BIRTHDATE: [birth year] , 41  yrs. old GENDER: male ENDOSCOPIST: Jerene Bears, MD REFERRED BY:  Breck Coons, N.P. PROCEDURE DATE:  01/18/2015 PROCEDURE:  EGD w/ biopsy ASA CLASS:     Class II INDICATIONS:  history of GERD and dysphagia. MEDICATIONS: Monitored anesthesia care and Propofol 250 mg IV TOPICAL ANESTHETIC: none  DESCRIPTION OF PROCEDURE: After the risks benefits and alternatives of the procedure were thoroughly explained, informed consent was obtained.  The LB [id number] endoscope was introduced through the mouth and advanced to the second portion of the duodenum , Without limitations.  The instrument was slowly withdrawn as the mucosa was fully examined.   ESOPHAGUS: There was possible short segment Barrett's esophagus found 39 cm from the incisors (C0, M1-2 if confirmed).  There was no nodular mucosa noted in the Barrett's segment.  Multiple biopsies were performed using cold forceps.  Sample obtained to rule out Barrett's esophagus.   Esophageal mucosa otherwise normal.  STOMACH: A small hiatal hernia was noted.   A 1.5cm subepithelial lesion was located in the gastric antrum.  Multiple biopsies in tunneled fashion were performed using cold forceps.   Gastric mucosa otherwise normal.  DUODENUM: The duodenal mucosa showed no abnormalities in the bulb and 2nd part of the duodenum.  Retroflexed views revealed a hiatal hernia.     The scope was then withdrawn from the patient and the procedure completed.  COMPLICATIONS: There were no immediate complications.  ENDOSCOPIC IMPRESSION: 1.   There was possible short segment Barrett's esophagus found 39 cm from the incisors; multiple biopsies were performed 2.   Small hiatal hernia 3.   A 1.5cm subepithelial lesion was located in the gastric antrum; multiple biopsies were  performed 4.   The duodenal mucosa showed no abnormalities in the bulb and 2nd part of the duodenum  RECOMMENDATIONS: 1.  Await biopsy results 2.  Continue current medications  eSigned:  Jerene Bears, MD 01/18/2015 2:57 PM    [place]:NZVJ Harmon Pier, NP and The Patient

## 2015-01-18 NOTE — Op Note (Signed)
[facility]  [address]. [place], [address]   COLONOSCOPY PROCEDURE REPORT  PATIENT: Jimmy Perez, Jimmy Perez  MR#: [id number] BIRTHDATE: [birth year] , 22  yrs. old GENDER: male ENDOSCOPIST: Jerene Bears, MD REFERRED RV:UYEB Rockne Coons, N.P. PROCEDURE DATE:  01/18/2015 PROCEDURE:   Colonoscopy with snare polypectomy and Colonoscopy, screening First Screening Colonoscopy - Avg.  risk and is 50 yrs.  old or older - No.  Prior Negative Screening - Now for repeat screening. N/A  History of Adenoma - Now for follow-up colonoscopy & has been > or = to 3 yrs.  N/A ASA CLASS:   Class II INDICATIONS:Screening for colonic neoplasia and FH Colon Adenoma. MEDICATIONS: Monitored anesthesia care, Residual sedation present, and Propofol 150 mg IV  DESCRIPTION OF PROCEDURE:   After the risks benefits and alternatives of the procedure were thoroughly explained, informed consent was obtained.  The digital rectal exam revealed no rectal mass.   The LB [id number]  endoscope was introduced through the anus and advanced to the cecum, which was identified by both the appendix and ileocecal valve. No adverse events experienced. The quality of the prep was (MiraLax was used) fair clearing to good with copious irrigation and lavage.  The instrument was then slowly withdrawn as the colon was fully examined.    COLON FINDINGS: Three sessile polyps ranging between 3-84mm in size were found in the descending colon (1) and rectum (2). Polypectomies were performed with a cold snare.  The resection was complete, the polyp tissue was partially retrieved (1 rectal polyp, 2 mm, not retrieved) and sent to histology.   There was mild diverticulosis noted in the sigmoid colon with associated muscular hypertrophy.  Retroflexed views revealed no abnormalities. The time to cecum = 1.2 Withdrawal time = 11.9   The scope was withdrawn and the procedure completed. COMPLICATIONS: There were no immediate  complications.  ENDOSCOPIC IMPRESSION: 1.   Three sessile polyps ranging between 3-74mm in size were found in the descending colon and rectum; polypectomies were performed with a cold snare 2.   There was mild diverticulosis noted in the sigmoid colon  RECOMMENDATIONS: 1.  Await pathology results 2.  High fiber diet 3.  Repeat Colonoscopy in 5 years. 4.  You will receive a letter within 1-2 weeks with the results of your biopsy as well as final recommendations.  Please call my office if you have not received a letter after 3 weeks. eSigned:  Jerene Bears, MD 01/18/2015 3:00 PM   cc: Chevis Pretty, NP and The Patient

## 2015-01-22 ENCOUNTER — Telehealth: Payer: Self-pay | Admitting: *Deleted

## 2015-01-22 ENCOUNTER — Encounter: Payer: Self-pay | Admitting: Family Medicine

## 2015-01-22 ENCOUNTER — Telehealth: Payer: Self-pay

## 2015-01-22 MED ORDER — DOXYCYCLINE HYCLATE 100 MG PO TABS: 100.0000 mg | ORAL_TABLET | Freq: Two times a day (BID) | ORAL | Status: DC

## 2015-01-22 NOTE — Telephone Encounter (Signed)
Pt aware.

## 2015-01-22 NOTE — Telephone Encounter (Signed)
  Follow up Call-  Call back number 01/18/2015  Post procedure Call Back phone  # [phone]  Permission to leave phone message Yes     Patient questions:  Do you have a fever, pain , or abdominal swelling? No. Pain Score  0 *  Have you tolerated food without any problems? Yes.    Have you been able to return to your normal activities? Yes.    Do you have any questions about your discharge instructions: Diet   no Medications  No. Follow up visit  No.  Do you have questions or concerns about your Care? No.  Actions: * If pain score is 4 or above: No action needed, pain <4.

## 2015-01-30 ENCOUNTER — Encounter: Payer: Self-pay | Admitting: Internal Medicine

## 2015-02-02 ENCOUNTER — Other Ambulatory Visit: Payer: Self-pay

## 2015-02-02 DIAGNOSIS — K3189 Other diseases of stomach and duodenum: Secondary | ICD-10-CM

## 2015-02-05 ENCOUNTER — Encounter (HOSPITAL_COMMUNITY): Payer: Self-pay | Admitting: *Deleted

## 2015-02-08 ENCOUNTER — Ambulatory Visit (HOSPITAL_COMMUNITY)
Admission: RE | Admit: 2015-02-08 | Discharge: 2015-02-08 | Disposition: A | Discharge status: Home / Self Care | Payer: 59 | Source: Ambulatory Visit | Attending: Gastroenterology | Admitting: Gastroenterology

## 2015-02-08 ENCOUNTER — Encounter (HOSPITAL_COMMUNITY): Payer: Self-pay | Admitting: *Deleted

## 2015-02-08 ENCOUNTER — Ambulatory Visit (HOSPITAL_COMMUNITY): Payer: 59 | Admitting: Anesthesiology

## 2015-02-08 ENCOUNTER — Encounter (HOSPITAL_COMMUNITY)
Admission: RE | Disposition: A | Discharge status: Home / Self Care | Payer: Self-pay | Source: Ambulatory Visit | Attending: Gastroenterology

## 2015-02-08 ENCOUNTER — Telehealth: Payer: Self-pay

## 2015-02-08 DIAGNOSIS — G4733 Obstructive sleep apnea (adult) (pediatric): Secondary | ICD-10-CM | POA: Diagnosis not present

## 2015-02-08 DIAGNOSIS — E559 Vitamin D deficiency, unspecified: Secondary | ICD-10-CM | POA: Insufficient documentation

## 2015-02-08 DIAGNOSIS — K219 Gastro-esophageal reflux disease without esophagitis: Secondary | ICD-10-CM | POA: Insufficient documentation

## 2015-02-08 DIAGNOSIS — F17290 Nicotine dependence, other tobacco product, uncomplicated: Secondary | ICD-10-CM | POA: Diagnosis not present

## 2015-02-08 DIAGNOSIS — I10 Essential (primary) hypertension: Secondary | ICD-10-CM | POA: Diagnosis not present

## 2015-02-08 DIAGNOSIS — K3189 Other diseases of stomach and duodenum: Secondary | ICD-10-CM

## 2015-02-08 DIAGNOSIS — K319 Disease of stomach and duodenum, unspecified: Secondary | ICD-10-CM | POA: Diagnosis not present

## 2015-02-08 HISTORY — PX: EUS: SHX5427

## 2015-02-08 SURGERY — UPPER ENDOSCOPIC ULTRASOUND (EUS) LINEAR
Anesthesia: Monitor Anesthesia Care

## 2015-02-08 MED ORDER — LACTATED RINGERS IV SOLN
INTRAVENOUS | Status: DC
  Administered 2015-02-08: 1000 mL via INTRAVENOUS

## 2015-02-08 MED ORDER — PROPOFOL 10 MG/ML IV BOLUS
INTRAVENOUS | Status: AC
  Filled 2015-02-08: qty 20

## 2015-02-08 MED ORDER — SODIUM CHLORIDE 0.9 % IV SOLN: INTRAVENOUS | Status: DC

## 2015-02-08 MED ORDER — PROPOFOL INFUSION 10 MG/ML OPTIME
INTRAVENOUS | Status: DC | PRN
  Administered 2015-02-08: 300 ug/kg/min via INTRAVENOUS

## 2015-02-08 NOTE — Telephone Encounter (Signed)
-----   Message from [name], MD sent at 02/08/2015  9:34 AM EDT ----- He needs recall EUS in 18 months, thanks

## 2015-02-08 NOTE — Discharge Instructions (Signed)

## 2015-02-08 NOTE — H&P (Signed)
  HPI: This is a man found tohave lesion in antrum of stomach during recent EGD    Past Medical History  Diagnosis Date  . GERD (gastroesophageal reflux disease)   . Hypertension   . Hyperlipidemia   . Vitamin D deficiency   . OSA (obstructive sleep apnea)     ? being evaluated-study 12-26-14 Epic suggestive of this.    Past Surgical History  Procedure Laterality Date  . Knee arthroscopy Right 11/28/95    [name])    Current Facility-Administered Medications  Medication Dose Route Frequency Provider Last Rate Last Dose  . 0.9 %  sodium chloride infusion   Intravenous Continuous [name], MD      . lactated ringers infusion   Intravenous Continuous [name], MD 125 mL/hr at 02/08/15 0810 1,000 mL at 02/08/15 0810    Allergies as of 02/02/2015  . (No Known Allergies)    Family History  Problem Relation Age of Onset  . Diabetes Mother   . Hyperlipidemia Father   . Heart disease Father   . Hypertension Father   . Diabetes Father   . Diabetes Sister   . Hypertension Sister   . Hyperlipidemia Brother   . Hypertension Brother   . Colon polyps Brother   . Colon cancer Neg Hx   . Stomach cancer Neg Hx     History   Social History  . Marital Status: Married    Spouse Name: Tye [place]   . Number of Children: 2  . Years of Education: N/A   Occupational History  . Financial trader    Social History Main Topics  . Smoking status: Never Smoker   . Smokeless tobacco: Current User    Types: Snuff     Comment: off an on (pouches) of snuff  . Alcohol Use: 3.6 oz/week    6 Cans of beer per week     Comment: occasional- depending on the occasion  . Drug Use: No  . Sexual Activity: Not on file   Other Topics Concern  . Not on file   Social History Narrative      Physical Exam: BP 111/57 mmHg  Pulse 47  Temp(Src) 97.6 F (36.4 C) (Oral)  Resp 20  Ht 5\' 7"  (1.702 m)  Wt 246 lb (111.585 kg)  BMI 38.52 kg/m2  SpO2 98% Constitutional: generally  well-appearing Psychiatric: alert and oriented x3 Abdomen: soft, nontender, nondistended, no obvious ascites, no peritoneal signs, normal bowel sounds     Assessment and plan: 49 y.o. male with lesion in stomach  For upper EUS today

## 2015-02-08 NOTE — Telephone Encounter (Signed)
18 month EUS recall has been put in [facility]

## 2015-02-08 NOTE — Transfer of Care (Signed)
Immediate Anesthesia Transfer of Care Note  Patient: Jimmy Perez.  Procedure(s) Performed: Procedure(s): UPPER ENDOSCOPIC ULTRASOUND (EUS) LINEAR (N/A)  Patient Location: PACU and Endoscopy Unit  Anesthesia Type:MAC  Level of Consciousness: awake, alert  and patient cooperative  Airway & Oxygen Therapy: Patient Spontanous Breathing and Patient connected to nasal cannula oxygen  Post-op Assessment: Report given to RN and Post -op Vital signs reviewed and stable  Post vital signs: Reviewed and stable  Last Vitals:  Filed Vitals:   02/08/15 0748  BP: 111/57  Pulse: 47  Temp: 36.4 C  Resp: 20    Complications: No apparent anesthesia complications

## 2015-02-08 NOTE — Anesthesia Postprocedure Evaluation (Signed)
  Anesthesia Post-op Note  Patient: Jimmy Perez.  Procedure(s) Performed: Procedure(s): UPPER ENDOSCOPIC ULTRASOUND (EUS) LINEAR (N/A)  Patient Location: PACU and Endoscopy Unit  Anesthesia Type:MAC  Level of Consciousness: awake  Airway and Oxygen Therapy: Patient Spontanous Breathing  Post-op Pain: mild  Post-op Assessment: Post-op Vital signs reviewed  Post-op Vital Signs: Reviewed  Last Vitals:  Filed Vitals:   02/08/15 0927  BP:   Pulse: 64  Temp: 36.6 C  Resp: 18    Complications: No apparent anesthesia complications

## 2015-02-08 NOTE — Anesthesia Preprocedure Evaluation (Addendum)
Anesthesia Evaluation  Patient identified by MRN, date of birth, ID band Patient awake    Reviewed: Allergy & Precautions, NPO status , Patient's Chart, lab work & pertinent test results  Airway Mallampati: II  TM Distance: >3 FB Neck ROM: Full    Dental   Pulmonary sleep apnea ,  breath sounds clear to auscultation        Cardiovascular hypertension, Rhythm:Regular Rate:Normal     Neuro/Psych    GI/Hepatic Neg liver ROS, GERD-  ,  Endo/Other  negative endocrine ROS  Renal/GU negative Renal ROS     Musculoskeletal   Abdominal   Peds  Hematology   Anesthesia Other Findings   Reproductive/Obstetrics                            Anesthesia Physical Anesthesia Plan  ASA: III  Anesthesia Plan: MAC   Post-op Pain Management:    Induction: Intravenous  Airway Management Planned: Simple Face Mask  Additional Equipment:   Intra-op Plan:   Post-operative Plan:   Informed Consent: I have reviewed the patients History and Physical, chart, labs and discussed the procedure including the risks, benefits and alternatives for the proposed anesthesia with the patient or authorized representative who has indicated his/her understanding and acceptance.   Dental advisory given  Plan Discussed with: CRNA and Anesthesiologist  Anesthesia Plan Comments:         Anesthesia Quick Evaluation

## 2015-02-08 NOTE — Op Note (Signed)
[facility] [place], [address]   ENDOSCOPIC ULTRASOUND PROCEDURE REPORT  PATIENT: Jimmy Perez, Jimmy Perez  MR#: [id number] BIRTHDATE: [birth year]  GENDER: male ENDOSCOPIST: [name], MD REFERRED BY:  Zenovia Jarred, MD PROCEDURE DATE:  02/08/2015 PROCEDURE:   Upper EUS ASA CLASS:      Class II INDICATIONS:   1.  subepithelial lesion noted on recent EGD Dr. Hilarie Fredrickson. MEDICATIONS: Monitored anesthesia care  DESCRIPTION OF PROCEDURE:   After the risks benefits and alternatives of the procedure were  explained, informed consent was obtained. The patient was then placed in the left, lateral, decubitus postion and IV sedation was administered. Throughout the procedure, the patients blood pressure, pulse and oxygen saturations were monitored continuously.  Under direct visualization, the EUS scope  endoscope was introduced through the mouth  and advanced to the second portion of the duodenum .  Water was used as necessary to provide an acoustic interface.  Upon completion of the imaging, water was removed and the patient was sent to the recovery room in satisfactory condition.  Endoscopic findings: 1. Approximately 1cm nodule, subepithelial, in the gastric antrum. Overlying mucosa was normal. 2. UGI tract was otherwise normal.  EUS findings: 1. The nodule above was hypoechoic, heterogeneous, seemed to communicate with the muscularis propria layer of the gastric wall. This measured 9.32mm maximally. 2. NO perigastric adenopathy 3. Limited views of the pancreas, spleen, liver, portal vessels were all normal.  ENDOSCOPIC IMPRESSION: 9.6mm subepithelial lesion in the gastric antrum. At this size, FNA has low sensitivity and so it was not sampled.  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  RECOMMENDATIONS: Will schedule repeat upper EUS in 18 months to chest for interval change   _______________________________ eSigned:  [name], MD 02/08/2015 9:32 AM

## 2015-02-09 ENCOUNTER — Encounter (HOSPITAL_COMMUNITY): Payer: Self-pay | Admitting: Gastroenterology

## 2015-03-07 ENCOUNTER — Telehealth: Payer: Self-pay | Admitting: Internal Medicine

## 2015-03-07 NOTE — Telephone Encounter (Signed)
His sleep study showed severe obstructive sleep apnea. He was stopping breathing over 70 times per hour.  The treatment for this is a CPAP air pressure mask system to keep his airway open at night  At his last visit, we were to schedule a return ov with me after the sleep study, but that wasn't done  Please order new DME, new CPAP auto 5-20, mask of choice, humidifier and supplies   Please schedule him back to see me within 90 days after his sleep study

## 2015-03-07 NOTE — Telephone Encounter (Signed)
Pt requesting home sleep study results from 12/26/14.    Dr. Annamaria Boots please advise on results.  Thanks!

## 2015-03-08 NOTE — Telephone Encounter (Signed)
Called and spoke to pt's wife. Informed her of the results per CY. Mrs. Riano stated she would talk with her husband and call back. Mrs. Careaga is aware this is time sensitive to get pt an OV within 90 days of sleep study.

## 2015-03-12 ENCOUNTER — Telehealth: Payer: Self-pay | Admitting: Internal Medicine

## 2015-03-12 DIAGNOSIS — G4733 Obstructive sleep apnea (adult) (pediatric): Secondary | ICD-10-CM

## 2015-03-12 NOTE — Telephone Encounter (Signed)
Spoke with patient's wife-aware order has been placed and will ask PCC's to contact her with information at to which DME patient will be set up with. Pt has also been scheduled to see CY on July 21,2016 at 4:15pm. Nothing more needed at this time.

## 2015-03-12 NOTE — Telephone Encounter (Signed)
See phone note dated 03-12-15 for further information and order placed.

## 2015-03-12 NOTE — Telephone Encounter (Signed)
lmtcb x1 for pt's wife. 

## 2015-03-21 ENCOUNTER — Ambulatory Visit (INDEPENDENT_AMBULATORY_CARE_PROVIDER_SITE_OTHER): Payer: 59 | Admitting: Pharmacist

## 2015-03-21 ENCOUNTER — Encounter: Payer: Self-pay | Admitting: Pharmacist

## 2015-03-21 ENCOUNTER — Other Ambulatory Visit: Payer: Self-pay | Admitting: Pharmacist

## 2015-03-21 VITALS — BP 115/60 | HR 70 | Ht 67.0 in | Wt 238.0 lb

## 2015-03-21 DIAGNOSIS — E119 Type 2 diabetes mellitus without complications: Secondary | ICD-10-CM | POA: Diagnosis not present

## 2015-03-21 DIAGNOSIS — E785 Hyperlipidemia, unspecified: Secondary | ICD-10-CM

## 2015-03-21 DIAGNOSIS — E559 Vitamin D deficiency, unspecified: Secondary | ICD-10-CM

## 2015-03-21 DIAGNOSIS — E782 Mixed hyperlipidemia: Secondary | ICD-10-CM | POA: Diagnosis not present

## 2015-03-21 DIAGNOSIS — E784 Other hyperlipidemia: Secondary | ICD-10-CM | POA: Diagnosis not present

## 2015-03-21 LAB — POCT GLYCOSYLATED HEMOGLOBIN (HGB A1C): Hemoglobin A1C: 6.3

## 2015-03-21 LAB — GLUCOSE, POCT (MANUAL RESULT ENTRY): POC Glucose: 124 mg/dl — AB (ref 70–99)

## 2015-03-21 NOTE — Progress Notes (Signed)
Patient ID: Jimmy Perez., male   DOB: [birth year], 49 y.o.   MRN: [id number] Subjective:    Adrian Tyrone Pautsch. is a 49 y.o. male who presents for an initial evaluation of Type 2 diabetes mellitus.  Current symptoms/problems include none   Patient's A1c was 9.0% 12/19/2014.  Since this result he has cut out all sodas and has decreased sweets  Known diabetic complications: none Cardiovascular risk factors: diabetes mellitus, dyslipidemia, hypertension, male gender and obesity (BMI >= 30 kg/m2) Current diabetic medications include none.   Eye exam current (within one year): unknown Weight trend: decreasing steadily Prior visit with dietician: no Current diet: improved over the last 3 months with regards to sugar and CHO intake Current exercise: some running  Current monitoring regimen: very rarely checking BG at home Home blood sugar records: did not bring record Any episodes of hypoglycemia? no  Is He on ACE inhibitor or angiotensin II receptor blocker?  Yes    losartan (Cozaar) 50mg  1 tablet daily  The following portions of the patient's history were reviewed and updated as appropriate: allergies, current medications, past family history, past medical history, past social history, past surgical history and problem list.  Objective:    BP 115/60 mmHg  Pulse 70  Ht 5\' 7"  (1.702 m)  Wt 238 lb (107.956 kg)  BMI 37.27 kg/m2   Lab Review GLUCOSE (mg/dL)  Date Value  12/14/2014 158*  04/14/2014 116*   GLUCOSE, BLD (mg/dL)  Date Value  04/18/2013 86   CO2  Date Value  12/14/2014 23 mmol/L  04/14/2014 24 mmol/L  04/18/2013 24 mEq/L   BUN (mg/dL)  Date Value  12/14/2014 13  04/14/2014 15  04/18/2013 15   CREAT (mg/dL)  Date Value  04/18/2013 0.93   CREATININE, SER (mg/dL)  Date Value  12/14/2014 0.84  04/14/2014 0.94     A1c was 6.3% today in office RBG was 124 today  Assessment:    Diabetes Mellitus type II, under improved control.   Dyslipidemia - elevated triglycerides and low HDL HTN - controlled with current regimen Plan:    1.  Rx changes: none 2.  Education: Reviewed 'ABCs' of diabetes management (respective goals in parentheses):  A1C (<7), blood pressure (<130/80), and cholesterol (LDL <100). 3.  Most of visit spent educating about CHO counting.  Patient was taught about CHO amount in various foods.  Also discussed portion sizes and recommendations.  Suggested goal CHO amount 50 to 60 grams of CHO per meal and 15 to 20 grams per snack.     Increase non-starchy vegetables - carrots, green bean, squash, zucchini, tomatoes, onions, peppers, spinach and other green leafy vegetables, cabbage, lettuce, cucumbers, asparagus, okra (not fried), eggplant limit sugar and processed foods (cakes, cookies, ice cream, crackers and chips) Increase fresh fruit but limit serving sizes 1/2 cup or about the size of tennis or baseball limit red meat to no more than 1-2 times per week (serving size about the size of your palm)  Choose whole grains / lean proteins - whole wheat bread, quinoa, whole grain rice (1/2 cup), fish, chicken, [place]  4. Follow up: 4 weeks  - to see Dr Laurance Flatten and CDE as needed  Cherre Robins, PharmD, CPP, CDE

## 2015-03-27 ENCOUNTER — Telehealth: Payer: Self-pay | Admitting: Internal Medicine

## 2015-03-27 NOTE — Telephone Encounter (Signed)
Called and spoke to pt's wife, Barnetta Chapel. Barnetta Chapel stated she has not yet heard from [facility] regarding the recent order placed for CPAP. Called [facility] and was advised they have tried to contact the pt but have not yet heard back. Dominic Pea to contact [facility] to coordinate the delivery. Barnetta Chapel verbalized understanding and denied any further questions or concerns at this time.

## 2015-05-01 ENCOUNTER — Other Ambulatory Visit: Payer: Self-pay | Admitting: Family Medicine

## 2015-05-03 ENCOUNTER — Other Ambulatory Visit: Payer: Self-pay

## 2015-05-03 MED ORDER — LOSARTAN POTASSIUM 50 MG PO TABS: 50.0000 mg | ORAL_TABLET | Freq: Every day | ORAL | Status: DC

## 2015-05-17 ENCOUNTER — Ambulatory Visit (INDEPENDENT_AMBULATORY_CARE_PROVIDER_SITE_OTHER): Payer: 59 | Admitting: Internal Medicine

## 2015-05-17 ENCOUNTER — Encounter: Payer: Self-pay | Admitting: Internal Medicine

## 2015-05-17 VITALS — BP 118/76 | HR 60 | Wt 239.0 lb

## 2015-05-17 DIAGNOSIS — E669 Obesity, unspecified: Secondary | ICD-10-CM | POA: Diagnosis not present

## 2015-05-17 DIAGNOSIS — G4733 Obstructive sleep apnea (adult) (pediatric): Secondary | ICD-10-CM

## 2015-05-17 NOTE — Progress Notes (Signed)
11/06/14- 39 yoM nonsmoker referred for sleep evaluation courtesy of Dr Wonda Horner sleep study. Snoring, stops breathing through the night (per wife) and alot of moving around. Wife here. Snoring has been worse for the last year or so as he has gained weight. Usual bedtime around 10:30 PM, sleep latency 10 minutes, occasionally wakes during the night before up around 6:30. No ENT surgery, heart or lung disease. Treated for high blood pressure. Little caffeine, no sleep med. [facility] 11/24  05/17/15- 76 yoM nonsmoker referred for sleep evaluation courtesy of Dr Wonda Horner sleep study. Snoring, stops breathing through the night (per wife) and alot of moving around. Wife here. FOLLOWS FOR:DME-[facility], getting use to wearing cpap auto 5-20/[facility] it's better Unattended home sleep study 01/05/15 AHI 77/ hr, desat to 73%, weight 253 lbs Using a nasal pillows mask. Sometimes auto titration pressure gets too high  ROS-see HPI Constitutional:   No-   weight loss, night sweats, fevers, chills, +fatigue, lassitude. HEENT:   No-  headaches, difficulty swallowing, tooth/dental problems, sore throat,       No-  sneezing, itching, ear ache, nasal congestion, post nasal drip,  CV:  No-   chest pain, orthopnea, PND, swelling in lower extremities, anasarca,                                                    dizziness, palpitations Resp: No-   shortness of breath with exertion or at rest.              No-   productive cough,  No non-productive cough,  No- coughing up of blood.              No-   change in color of mucus.  No- wheezing.   Skin: No-   rash or lesions. GI:  +heartburn, indigestion, No-abdominal pain, nausea, vomiting,  GU:  MS:  No-   joint pain or swelling.   Neuro-     nothing unusual Psych:  No- change in mood or affect. No depression or anxiety.  No memory loss.  OBJ- Physical Exam     +obese General- Alert, Oriented, Affect-appropriate, Distress- none acute Skin- rash-none, lesions- none,  excoriation- none Lymphadenopathy- none Head- atraumatic            Eyes- Gross vision intact, PERRLA, conjunctivae and secretions clear            Ears- Hearing, canals-normal            Nose- Clear, no-Septal dev, mucus, polyps, erosion, perforation             Throat- Mallampati IV , mucosa clear , drainage- none, tonsils- atrophic Neck- flexible , trachea midline, no stridor , thyroid nl, carotid no bruit Chest - symmetrical excursion , unlabored           Heart/CV- RRR , no murmur , no gallop  , no rub, nl s1 s2                           - JVD- none , edema- none, stasis changes- none, varices- none           Lung- clear to P&A, wheeze- none, cough- none , dullness-none, rub- none           Chest wall-  Abd-  Br/ Gen/ Rectal- Not done, not indicated Extrem- cyanosis- none, clubbing, none, atrophy- none, strength- nl Neuro- grossly intact to observation

## 2015-05-17 NOTE — Patient Instructions (Signed)
Order- [facility]- change CPAP to auto 8-15      Dx [facility]SA  Please call as needed

## 2015-05-19 NOTE — Assessment & Plan Note (Signed)
Download documents good compliance and control. Sometimes the pressure feels too high. Plan-narrow auto titration range to 8-15

## 2015-05-19 NOTE — Assessment & Plan Note (Signed)
Encouraged again make more effort at weight loss

## 2015-05-19 NOTE — Addendum Note (Signed)
Addended by: Baird Lyons D on: 05/19/2015 08:53 PM   Modules accepted: Miquel Dunn

## 2015-05-21 ENCOUNTER — Telehealth: Payer: Self-pay | Admitting: *Deleted

## 2015-05-21 NOTE — Telephone Encounter (Signed)
Doxycycline 100 mg twice daily with food for 2 weeks

## 2015-05-21 NOTE — Telephone Encounter (Signed)
Had a tick bite this weekend and would like doxycycline sent to pharmacy. Please advise

## 2015-05-22 NOTE — Telephone Encounter (Signed)
Called to [facility] in [place] . Patient notified

## 2015-06-04 ENCOUNTER — Encounter: Payer: Self-pay | Admitting: Internal Medicine

## 2015-06-06 ENCOUNTER — Other Ambulatory Visit (INDEPENDENT_AMBULATORY_CARE_PROVIDER_SITE_OTHER): Payer: 59

## 2015-06-06 DIAGNOSIS — Z0000 Encounter for general adult medical examination without abnormal findings: Secondary | ICD-10-CM

## 2015-06-06 DIAGNOSIS — E8881 Metabolic syndrome: Secondary | ICD-10-CM

## 2015-06-06 DIAGNOSIS — E559 Vitamin D deficiency, unspecified: Secondary | ICD-10-CM

## 2015-06-06 DIAGNOSIS — K219 Gastro-esophageal reflux disease without esophagitis: Secondary | ICD-10-CM

## 2015-06-06 DIAGNOSIS — I10 Essential (primary) hypertension: Secondary | ICD-10-CM

## 2015-06-06 DIAGNOSIS — E785 Hyperlipidemia, unspecified: Secondary | ICD-10-CM

## 2015-06-06 NOTE — Addendum Note (Signed)
Addended by: Pollyann Kennedy F on: 06/06/2015 03:27 PM   Modules accepted: Orders

## 2015-06-07 ENCOUNTER — Ambulatory Visit: Payer: 59 | Admitting: Family Medicine

## 2015-06-07 LAB — LIPID PANEL
Chol/HDL Ratio: 4.3 ratio units (ref 0.0–5.0)
Cholesterol, Total: 164 mg/dL (ref 100–199)
HDL: 38 mg/dL — ABNORMAL LOW (ref >39)
LDL CALC: 58 mg/dL (ref 0–99)
Triglycerides: 340 mg/dL — ABNORMAL HIGH (ref 0–149)
VLDL Cholesterol Cal: 68 mg/dL — ABNORMAL HIGH (ref 5–40)

## 2015-06-07 LAB — CMP14+EGFR
ALBUMIN: 4.6 g/dL (ref 3.5–5.5)
ALK PHOS: 69 IU/L (ref 39–117)
ALT: 27 IU/L (ref 0–44)
AST: 15 IU/L (ref 0–40)
Albumin/Globulin Ratio: 2.4 (ref 1.1–2.5)
BILIRUBIN TOTAL: 0.4 mg/dL (ref 0.0–1.2)
BUN / CREAT RATIO: 13 (ref 9–20)
BUN: 11 mg/dL (ref 6–24)
CO2: 26 mmol/L (ref 18–29)
Calcium: 9.5 mg/dL (ref 8.7–10.2)
Chloride: 101 mmol/L (ref 97–108)
Creatinine, Ser: 0.84 mg/dL (ref 0.76–1.27)
GFR calc non Af Amer: 104 mL/min/{1.73_m2} (ref >59)
GFR, EST AFRICAN AMERICAN: 120 mL/min/{1.73_m2} (ref >59)
Globulin, Total: 1.9 g/dL (ref 1.5–4.5)
Glucose: 90 mg/dL (ref 65–99)
Potassium: 5 mmol/L (ref 3.5–5.2)
Sodium: 143 mmol/L (ref 134–144)
Total Protein: 6.5 g/dL (ref 6.0–8.5)

## 2015-06-07 LAB — VITAMIN D 25 HYDROXY (VIT D DEFICIENCY, FRACTURES): Vit D, 25-Hydroxy: 22.1 ng/mL — ABNORMAL LOW (ref 30.0–100.0)

## 2015-06-07 LAB — LDL CHOLESTEROL, DIRECT: LDL DIRECT: 88 mg/dL (ref 0–99)

## 2015-06-08 ENCOUNTER — Ambulatory Visit (INDEPENDENT_AMBULATORY_CARE_PROVIDER_SITE_OTHER): Payer: 59 | Admitting: Family Medicine

## 2015-06-08 ENCOUNTER — Encounter: Payer: Self-pay | Admitting: Family Medicine

## 2015-06-08 VITALS — BP 122/61 | HR 67 | Temp 97.9°F | Ht 67.0 in | Wt 233.4 lb

## 2015-06-08 DIAGNOSIS — E781 Pure hyperglyceridemia: Secondary | ICD-10-CM

## 2015-06-08 DIAGNOSIS — E8881 Metabolic syndrome: Secondary | ICD-10-CM | POA: Diagnosis not present

## 2015-06-08 DIAGNOSIS — K219 Gastro-esophageal reflux disease without esophagitis: Secondary | ICD-10-CM | POA: Diagnosis not present

## 2015-06-08 DIAGNOSIS — W57XXXA Bitten or stung by nonvenomous insect and other nonvenomous arthropods, initial encounter: Secondary | ICD-10-CM | POA: Diagnosis not present

## 2015-06-08 DIAGNOSIS — I10 Essential (primary) hypertension: Secondary | ICD-10-CM | POA: Diagnosis not present

## 2015-06-08 DIAGNOSIS — S30860A Insect bite (nonvenomous) of lower back and pelvis, initial encounter: Secondary | ICD-10-CM

## 2015-06-08 DIAGNOSIS — E785 Hyperlipidemia, unspecified: Secondary | ICD-10-CM | POA: Diagnosis not present

## 2015-06-08 MED ORDER — AMLODIPINE BESYLATE 5 MG PO TABS: ORAL_TABLET | ORAL | Status: DC

## 2015-06-08 MED ORDER — LOSARTAN POTASSIUM 50 MG PO TABS: 50.0000 mg | ORAL_TABLET | Freq: Every day | ORAL | Status: DC

## 2015-06-08 MED ORDER — ROSUVASTATIN CALCIUM 5 MG PO TABS: 5.0000 mg | ORAL_TABLET | Freq: Every day | ORAL | Status: DC

## 2015-06-08 MED ORDER — ESOMEPRAZOLE MAGNESIUM 40 MG PO CPDR: 40.0000 mg | DELAYED_RELEASE_CAPSULE | Freq: Every day | ORAL | Status: DC

## 2015-06-08 MED ORDER — VITAMIN D (ERGOCALCIFEROL) 1.25 MG (50000 UNIT) PO CAPS: 50000.0000 [IU] | ORAL_CAPSULE | ORAL | Status: DC

## 2015-06-08 NOTE — Patient Instructions (Signed)
Continue with diet and exercise regimen Because of the tick bites we will check a blood profile for [facility] spotted fever and Lyme disease Please finish doxycycline prescription that has been called in You are to be applauded for the results of your efforts with weight loss as this will make a tremendous difference with the way you feel, the energy level and your breathing and sleep apnea.

## 2015-06-08 NOTE — Progress Notes (Signed)
Subjective:    Patient ID: Jimmy Perez., male    DOB: [birth year], 49 y.o.   MRN: [id number]  HPI  49 year old male comes in today for follow up on chronic medical conditions which include hyperlipidemia, hypertension, and metabolic syndrome.   Patient Active Problem List   Diagnosis Date Noted  . Low back pain 12/06/2014  . Obstructive sleep apnea 11/06/2014  . Metabolic syndrome 84/66/5993  . HTN (hypertension) 04/18/2013  . Hyperlipidemia 04/18/2013  . GERD (gastroesophageal reflux disease) 04/18/2013  . Obesity 04/18/2013   Outpatient Encounter Prescriptions as of 06/08/2015  Medication Sig  . acetaminophen (TYLENOL) 500 MG tablet Take 500 mg by mouth every 6 (six) hours as needed for pain.  Marland Kitchen amLODipine (NORVASC) 5 MG tablet Take 1 tablet by mouth  daily  . cholecalciferol (VITAMIN D) 1000 UNITS tablet Take 1,000 Units by mouth daily.  . cyclobenzaprine (FLEXERIL) 10 MG tablet Take 1 tablet (10 mg total) by mouth 3 (three) times daily as needed for muscle spasms.  . diazepam (VALIUM) 10 MG tablet Take 10 mg by mouth at bedtime as needed for anxiety.  Marland Kitchen esomeprazole (NEXIUM) 40 MG capsule Take 1 capsule (40 mg total) by mouth daily.  Marland Kitchen losartan (COZAAR) 50 MG tablet Take 1 tablet (50 mg total) by mouth daily.  . naproxen (NAPROSYN) 500 MG tablet Take 1 tablet (500 mg total) by mouth 2 (two) times daily with a meal. (Patient taking differently: Take 500 mg by mouth 2 (two) times daily as needed for mild pain. )  . omeprazole (PRILOSEC) 40 MG capsule Take 1 capsule by mouth  daily  . ranitidine (ZANTAC) 150 MG tablet Take 1 tablet (150 mg total) by mouth every evening. As needed. (Patient taking differently: Take 150 mg by mouth daily as needed for heartburn. As needed.)  . rosuvastatin (CRESTOR) 5 MG tablet Take 1 tablet (5 mg total) by mouth daily.  . [DISCONTINUED] amLODipine (NORVASC) 5 MG tablet Take 1 tablet by mouth  daily  . [DISCONTINUED] esomeprazole (NEXIUM) 40 MG  capsule Take 1 capsule (40 mg total) by mouth daily.  . [DISCONTINUED] losartan (COZAAR) 50 MG tablet Take 1 tablet (50 mg total) by mouth daily.  . [DISCONTINUED] rosuvastatin (CRESTOR) 5 MG tablet Take 5 mg by mouth daily.  . [DISCONTINUED] Vitamin D, Ergocalciferol, (DRISDOL) 50000 UNITS CAPS capsule Take 1 capsule (50,000 Units total) by mouth every 7 (seven) days.   No facility-administered encounter medications on file as of 06/08/2015.      Review of Systems  Constitutional: Negative.   HENT: Negative.   Eyes: Negative.   Respiratory: Negative.   Cardiovascular: Negative.   Gastrointestinal: Negative.   Endocrine: Negative.   Musculoskeletal: Negative.   Skin: Positive for rash.       The patient has healing bites on his back and right ankle.  Allergic/Immunologic: Negative.   Neurological: Negative.   Hematological: Negative.   Psychiatric/Behavioral: Negative.        Objective:   Physical Exam  Constitutional: He is oriented to person, place, and time. He appears well-developed and well-nourished.  HENT:  Head: Normocephalic and atraumatic.  Right Ear: External ear normal.  Left Ear: External ear normal.  Nose: Nose normal.  Mouth/Throat: Oropharynx is clear and moist. No oropharyngeal exudate.  Eyes: Conjunctivae and EOM are normal. Pupils are equal, round, and reactive to light. Right eye exhibits no discharge. Left eye exhibits no discharge. No scleral icterus.  Neck: Normal range of  motion. Neck supple. No thyromegaly present.  Cardiovascular: Normal rate, regular rhythm and intact distal pulses.   No murmur heard. At 72/m  Pulmonary/Chest: Effort normal and breath sounds normal. No respiratory distress. He has no wheezes. He has no rales. He exhibits no tenderness.  Abdominal: Soft. Bowel sounds are normal. He exhibits no mass. There is no tenderness. There is no rebound and no guarding.  Musculoskeletal: Normal range of motion. He exhibits no edema.    Lymphadenopathy:    He has no cervical adenopathy.  Neurological: He is alert and oriented to person, place, and time.  Skin: Skin is warm and dry. No rash noted.  Two bite sites on back and multiple bites of an insect on right ankle  Psychiatric: He has a normal mood and affect. His behavior is normal. Judgment and thought content normal.  Nursing note and vitals reviewed.   BP 122/61 mmHg  Pulse 67  Temp(Src) 97.9 F (36.6 C) (Oral)  Ht 5\' 7"  (1.702 m)  Wt 233 lb 6.4 oz (105.87 kg)  BMI 36.55 kg/m2        Assessment & Plan:  1. Gastroesophageal reflux disease, esophagitis presence not specified -The patient is doing much better with this and he will continue with current medication and weight loss regimen - esomeprazole (NEXIUM) 40 MG capsule; Take 1 capsule (40 mg total) by mouth daily.  Dispense: 90 capsule; Refill: 3  2. Metabolic syndrome -He will continue watching his diet closely and exercise  3. Essential hypertension -The blood pressure is good today and he will continue with his current medication and sodium restriction  4. Hyperlipidemia -He will continue with Crestor  5. Tick bite of back, initial encounter -Continue and complete doxycycline  6. Hypertriglyceridemia -Take vascepa one daily and continue to watch diet and we will recheck the triglycerides again in 3 months  Patient Instructions  Continue with diet and exercise regimen Because of the tick bites we will check a blood profile for [facility] spotted fever and Lyme disease Please finish doxycycline prescription that has been called in You are to be applauded for the results of your efforts with weight loss as this will make a tremendous difference with the way you feel, the energy level and your breathing and sleep apnea.   Arrie Senate MD

## 2015-06-12 LAB — ROCKY MTN SPOTTED FVR AB, IGG-BLOOD: RMSF IgG: NEGATIVE

## 2015-06-12 LAB — SPECIMEN STATUS REPORT

## 2015-06-12 LAB — B. BURGDORFI ANTIBODIES: Lyme IgG/IgM Ab: <0.91 {ISR} (ref 0.00–0.90)

## 2015-06-13 MED ORDER — ESOMEPRAZOLE MAGNESIUM 40 MG PO CPDR: 40.0000 mg | DELAYED_RELEASE_CAPSULE | Freq: Every day | ORAL | Status: DC

## 2015-06-13 MED ORDER — VITAMIN D (ERGOCALCIFEROL) 1.25 MG (50000 UNIT) PO CAPS: 50000.0000 [IU] | ORAL_CAPSULE | ORAL | Status: DC

## 2015-06-13 NOTE — Addendum Note (Signed)
Addended by: Zannie Cove on: 06/13/2015 09:29 AM   Modules accepted: Orders

## 2015-06-15 ENCOUNTER — Other Ambulatory Visit: Payer: Self-pay | Admitting: *Deleted

## 2015-06-15 MED ORDER — ROSUVASTATIN CALCIUM 5 MG PO TABS: 5.0000 mg | ORAL_TABLET | Freq: Every day | ORAL | Status: DC

## 2015-06-19 ENCOUNTER — Other Ambulatory Visit: Payer: Self-pay

## 2015-06-19 MED ORDER — OMEPRAZOLE 40 MG PO CPDR: DELAYED_RELEASE_CAPSULE | ORAL | Status: DC

## 2015-07-26 ENCOUNTER — Ambulatory Visit (INDEPENDENT_AMBULATORY_CARE_PROVIDER_SITE_OTHER): Payer: 59 | Admitting: Pediatrics

## 2015-07-26 ENCOUNTER — Encounter: Payer: Self-pay | Admitting: Pediatrics

## 2015-07-26 VITALS — BP 135/82 | HR 82 | Temp 97.3°F | Ht 67.0 in | Wt 233.2 lb

## 2015-07-26 DIAGNOSIS — M7522 Bicipital tendinitis, left shoulder: Secondary | ICD-10-CM

## 2015-07-26 NOTE — Progress Notes (Signed)
Subjective:    Patient ID: Jimmy Perez., male    DOB: 10/01/1966, 49 y.o.   MRN: [id number]  HPI: [name]. is a 48 y.o. male presenting on 07/26/2015 for Shoulder Pain  L arm feels sore, like he had a shot, feels irritating most of the time.  Sometimes irritates much more, such as yesterday when grabbing a handle to pull himself onto a piece of equipment couldn't even lift himself onto the equipment with that arm, had to use R arm. Taking some tylenol here and there for the pain, started naproxen two days ago. R handed, but uses L arm a lot at work, sometimes in Psychologist, educational area at work, pulling self onto [employer], also doing contruction at times, lifting lumbar. No heat or ice yet Using some icy hot No problems with this shoulder before Has had tennis elbow before over a year ago, improved after steroid injection with Dr. Micheline Chapman.   Relevant past medical, surgical, family and social history reviewed and updated as indicated. Interim medical history since our last visit reviewed. Allergies and medications reviewed and updated.   ROS: Per HPI unless specifically indicated above  Past Medical History Patient Active Problem List   Diagnosis Date Noted  . Low back pain 12/06/2014  . Obstructive sleep apnea 11/06/2014  . Metabolic syndrome 26/20/3559  . HTN (hypertension) 04/18/2013  . Hyperlipidemia 04/18/2013  . GERD (gastroesophageal reflux disease) 04/18/2013  . Obesity 04/18/2013    Current Outpatient Prescriptions  Medication Sig Dispense Refill  . acetaminophen (TYLENOL) 500 MG tablet Take 500 mg by mouth every 6 (six) hours as needed for pain.    Marland Kitchen amLODipine (NORVASC) 5 MG tablet Take 1 tablet by mouth  daily 90 tablet 1  . cholecalciferol (VITAMIN D) 1000 UNITS tablet Take 1,000 Units by mouth daily.    . cyclobenzaprine (FLEXERIL) 10 MG tablet Take 1 tablet (10 mg total) by mouth 3 (three) times daily as needed for muscle spasms. 30 tablet  0  . esomeprazole (NEXIUM) 40 MG capsule Take 1 capsule (40 mg total) by mouth daily. 90 capsule 3  . losartan (COZAAR) 50 MG tablet Take 1 tablet (50 mg total) by mouth daily. 90 tablet 1  . naproxen (NAPROSYN) 500 MG tablet Take 1 tablet (500 mg total) by mouth 2 (two) times daily with a meal. (Patient taking differently: Take 500 mg by mouth 2 (two) times daily as needed for mild pain. ) 60 tablet 1  . ranitidine (ZANTAC) 150 MG tablet Take 1 tablet (150 mg total) by mouth every evening. As needed. (Patient taking differently: Take 150 mg by mouth daily as needed for heartburn. As needed.) 30 tablet 1  . rosuvastatin (CRESTOR) 5 MG tablet Take 1 tablet (5 mg total) by mouth daily. 90 tablet 1  . Vitamin D, Ergocalciferol, (DRISDOL) 50000 UNITS CAPS capsule Take 1 capsule (50,000 Units total) by mouth every 7 (seven) days. 12 capsule 3  . diazepam (VALIUM) 10 MG tablet Take 10 mg by mouth at bedtime as needed for anxiety.    Marland Kitchen omeprazole (PRILOSEC) 40 MG capsule Take 1 capsule by mouth  daily (Patient not taking: Reported on 07/26/2015) 90 capsule 1   No current facility-administered medications for this visit.       Objective:    BP 135/82 mmHg  Pulse 82  Temp(Src) 97.3 F (36.3 C) (Oral)  Ht 5\' 7"  (1.702 m)  Wt 233 lb 3.2 oz (105.779 kg)  BMI  36.52 kg/m2  Wt Readings from Last 3 Encounters:  07/26/15 233 lb 3.2 oz (105.779 kg)  06/08/15 233 lb 6.4 oz (105.87 kg)  05/17/15 239 lb (108.41 kg)     Gen: NAD, alert, cooperative with exam, NCAT EYES: EOMI, no scleral injection or icterus CV: distal pulses 2+  Resp: normal WOB Neuro: Alert and oriented, sensation intact UE MSK: Shoulder: Inspection reveals no abnormalities, atrophy or asymmetry. no tenderness over AC joint, point tenderness over bicipital groove. Pain with active ROM with arm abduction over 90 degrees, pain is less with passive ROM.  Rotator cuff muscles all intact. Pain with internal rotation against force.        Assessment & Plan:   [name] was seen today for shoulder pain, with tenderness over biceps tendon insertion site at bicipital groove and worsening of pain with flex against force at the elbow. Likely with biceps tendonitis. Rec rest, NSAIDs, gave handout with ROM for shoulder exercises, gentles stretches. Avoid overusing arm for the next week. If still with pain will ocnsider PT vs sports medicine, pt has seen Dr. Micheline Chapman in the past.  Diagnoses and all orders for this visit:  Biceps tendonitis, left    Follow up plan: Return if symptoms worsen or fail to improve.  Assunta Found, MD [facility] 07/26/2015, 5:34 PM

## 2015-07-26 NOTE — Patient Instructions (Signed)

## 2015-08-13 ENCOUNTER — Encounter: Payer: Self-pay | Admitting: Podiatry

## 2015-08-13 ENCOUNTER — Ambulatory Visit (INDEPENDENT_AMBULATORY_CARE_PROVIDER_SITE_OTHER): Payer: 59

## 2015-08-13 ENCOUNTER — Ambulatory Visit (INDEPENDENT_AMBULATORY_CARE_PROVIDER_SITE_OTHER): Payer: 59 | Admitting: Podiatry

## 2015-08-13 VITALS — BP 150/90 | HR 62 | Resp 16 | Ht 67.0 in | Wt 230.0 lb

## 2015-08-13 DIAGNOSIS — B351 Tinea unguium: Secondary | ICD-10-CM | POA: Diagnosis not present

## 2015-08-13 DIAGNOSIS — L600 Ingrowing nail: Secondary | ICD-10-CM

## 2015-08-13 DIAGNOSIS — Z23 Encounter for immunization: Secondary | ICD-10-CM

## 2015-08-13 NOTE — Patient Instructions (Signed)

## 2015-08-13 NOTE — Progress Notes (Signed)
   Subjective:    Patient ID: Jimmy Perez., male    DOB: 12/31/1965, 49 y.o.   MRN: [id number]  HPI Patient presents with nail fungus on their right foot, great toe. Pt has used OTC medication with some relief; x6 months.  Patient presents with an ingrown toenail in their left foot, great toe-medial side. Pt wants to discuss-gets ingrown in both great toes. Pt stated, "got nail out, but still hurts"; x3 weeks.  Review of Systems  All other systems reviewed and are negative.      Objective:   Physical Exam        Assessment & Plan:

## 2015-08-14 ENCOUNTER — Telehealth: Payer: Self-pay | Admitting: *Deleted

## 2015-08-14 NOTE — Telephone Encounter (Signed)
Patient called me back regarding their ingrown toenail. Pt stated, "Had some pain at 3 am and took Advil, with some relief. Pt also stated that has a little bit of bleeding on toe." Pt has soaked toe with some relief.

## 2015-08-14 NOTE — Progress Notes (Signed)
Subjective:     Patient ID: Jimmy Loretha Stapler., male   DOB: 1966-09-13, 49 y.o.   MRN: [id number]  HPI patient presents with wife with painful ingrown toenail deformities the hallux of both feet medial border that they have tried to work with and also some yellow discoloration of the big toenail right with probable history of trauma   Review of Systems  All other systems reviewed and are negative.      Objective:   Physical Exam  Constitutional: He is oriented to person, place, and time.  Cardiovascular: Intact distal pulses.   Musculoskeletal: Normal range of motion.  Neurological: He is oriented to person, place, and time.  Skin: Skin is warm.  Nursing note and vitals reviewed.  neurovascular status was found to be intact muscle strength was adequate range of motion within normal limits with patient noted to have incurvated hallux borders medial bilateral that are painful when pressed and makes shoe gear difficult. Patient has tried different modalities without relief and also is found to have good digital perfusion and is well oriented 3     Assessment:     Ingrown toenail deformity hallux bilateral medial border with mild possible distal fungus of the right hallux nail    Plan:     H&P conditions reviewed with patient and recommended removal of the nail corners. Explained risk of procedure patient wants the surgery and today I infiltrated each hallux 60 mg like Marcaine mixture removed the medial borders exposed matrix and applied phenol 3 applications 30 seconds followed by alcohol lavage and sterile dressing. Gave instructions on soaks and reappoint

## 2015-08-14 NOTE — Telephone Encounter (Signed)
Left message for patient at [phone] (Cell #) to check to see how they were doing from their ingrown toenail procedure that performed on Monday, August 13, 2015. Waiting for a response.

## 2015-09-12 ENCOUNTER — Other Ambulatory Visit: Payer: Self-pay | Admitting: Nurse Practitioner

## 2015-09-13 ENCOUNTER — Other Ambulatory Visit: Payer: Self-pay | Admitting: *Deleted

## 2015-09-13 DIAGNOSIS — K219 Gastro-esophageal reflux disease without esophagitis: Secondary | ICD-10-CM

## 2015-09-13 DIAGNOSIS — E8881 Metabolic syndrome: Secondary | ICD-10-CM

## 2015-09-13 DIAGNOSIS — I10 Essential (primary) hypertension: Secondary | ICD-10-CM

## 2015-09-13 DIAGNOSIS — E559 Vitamin D deficiency, unspecified: Secondary | ICD-10-CM

## 2015-09-13 DIAGNOSIS — Z0000 Encounter for general adult medical examination without abnormal findings: Secondary | ICD-10-CM

## 2015-09-13 DIAGNOSIS — E785 Hyperlipidemia, unspecified: Secondary | ICD-10-CM

## 2015-09-15 LAB — POCT UA - MICROSCOPIC ONLY
Bacteria, U Microscopic: NEGATIVE
Casts, Ur, LPF, POC: NEGATIVE
Crystals, Ur, HPF, POC: NEGATIVE
Epithelial cells, urine per micros: NEGATIVE
Mucus, UA: NEGATIVE
RBC, URINE, MICROSCOPIC: NEGATIVE
WBC, UR, HPF, POC: NEGATIVE
Yeast, UA: NEGATIVE

## 2015-09-15 LAB — POCT URINALYSIS DIPSTICK
Bilirubin, UA: NEGATIVE
GLUCOSE UA: NEGATIVE
Ketones, UA: NEGATIVE
Leukocytes, UA: NEGATIVE
NITRITE UA: NEGATIVE
PH UA: 7.5
Protein, UA: NEGATIVE
RBC UA: NEGATIVE
Spec Grav, UA: <=1.005
UROBILINOGEN UA: NEGATIVE

## 2015-09-15 NOTE — Addendum Note (Signed)
Addended by: Pollyann Kennedy F on: 09/15/2015 09:19 AM   Modules accepted: Orders

## 2015-09-17 ENCOUNTER — Encounter: Payer: Self-pay | Admitting: Family Medicine

## 2015-09-17 ENCOUNTER — Ambulatory Visit (INDEPENDENT_AMBULATORY_CARE_PROVIDER_SITE_OTHER): Payer: 59 | Admitting: Family Medicine

## 2015-09-17 VITALS — BP 136/88 | HR 62 | Temp 97.5°F | Ht 67.0 in | Wt 237.0 lb

## 2015-09-17 DIAGNOSIS — E785 Hyperlipidemia, unspecified: Secondary | ICD-10-CM

## 2015-09-17 DIAGNOSIS — N400 Enlarged prostate without lower urinary tract symptoms: Secondary | ICD-10-CM

## 2015-09-17 DIAGNOSIS — E8881 Metabolic syndrome: Secondary | ICD-10-CM

## 2015-09-17 DIAGNOSIS — K219 Gastro-esophageal reflux disease without esophagitis: Secondary | ICD-10-CM

## 2015-09-17 DIAGNOSIS — I10 Essential (primary) hypertension: Secondary | ICD-10-CM

## 2015-09-17 DIAGNOSIS — Z0000 Encounter for general adult medical examination without abnormal findings: Secondary | ICD-10-CM

## 2015-09-17 LAB — HEPATIC FUNCTION PANEL
ALK PHOS: 73 IU/L (ref 39–117)
ALT: 30 IU/L (ref 0–44)
AST: 15 IU/L (ref 0–40)
Albumin: 4.9 g/dL (ref 3.5–5.5)
Bilirubin Total: 0.6 mg/dL (ref 0.0–1.2)
Bilirubin, Direct: 0.17 mg/dL (ref 0.00–0.40)
TOTAL PROTEIN: 7.1 g/dL (ref 6.0–8.5)

## 2015-09-17 LAB — CBC WITH DIFFERENTIAL/PLATELET
BASOS ABS: 0 10*3/uL (ref 0.0–0.2)
Basos: 0 %
EOS (ABSOLUTE): 0.2 10*3/uL (ref 0.0–0.4)
Eos: 2 %
HEMOGLOBIN: 15 g/dL (ref 12.6–17.7)
Hematocrit: 44.6 % (ref 37.5–51.0)
Immature Grans (Abs): 0 10*3/uL (ref 0.0–0.1)
Immature Granulocytes: 0 %
LYMPHS ABS: 1.7 10*3/uL (ref 0.7–3.1)
Lymphs: 21 %
MCH: 31.8 pg (ref 26.6–33.0)
MCHC: 33.6 g/dL (ref 31.5–35.7)
MCV: 95 fL (ref 79–97)
MONOS ABS: 0.7 10*3/uL (ref 0.1–0.9)
Monocytes: 9 %
NEUTROS ABS: 5.3 10*3/uL (ref 1.4–7.0)
Neutrophils: 68 %
PLATELETS: 279 10*3/uL (ref 150–379)
RBC: 4.71 x10E6/uL (ref 4.14–5.80)
RDW: 13.1 % (ref 12.3–15.4)
WBC: 7.9 10*3/uL (ref 3.4–10.8)

## 2015-09-17 LAB — BMP8+EGFR
BUN/Creatinine Ratio: 22 — ABNORMAL HIGH (ref 9–20)
BUN: 19 mg/dL (ref 6–24)
CO2: 22 mmol/L (ref 18–29)
Calcium: 10 mg/dL (ref 8.7–10.2)
Chloride: 99 mmol/L (ref 97–106)
Creatinine, Ser: 0.88 mg/dL (ref 0.76–1.27)
GFR, EST AFRICAN AMERICAN: 117 mL/min/{1.73_m2} (ref >59)
GFR, EST NON AFRICAN AMERICAN: 102 mL/min/{1.73_m2} (ref >59)
Glucose: 100 mg/dL — ABNORMAL HIGH (ref 65–99)
POTASSIUM: 4.4 mmol/L (ref 3.5–5.2)
SODIUM: 140 mmol/L (ref 136–144)

## 2015-09-17 LAB — LIPID PANEL
CHOLESTEROL TOTAL: 172 mg/dL (ref 100–199)
Chol/HDL Ratio: 4.2 ratio units (ref 0.0–5.0)
HDL: 41 mg/dL (ref >39)
LDL CALC: 61 mg/dL (ref 0–99)
TRIGLYCERIDES: 348 mg/dL — AB (ref 0–149)
VLDL Cholesterol Cal: 70 mg/dL — ABNORMAL HIGH (ref 5–40)

## 2015-09-17 LAB — PSA: Prostate Specific Ag, Serum: 0.5 ng/mL (ref 0.0–4.0)

## 2015-09-17 LAB — VITAMIN D 25 HYDROXY (VIT D DEFICIENCY, FRACTURES): Vit D, 25-Hydroxy: 33.7 ng/mL (ref 30.0–100.0)

## 2015-09-17 MED ORDER — LOSARTAN POTASSIUM 50 MG PO TABS: 50.0000 mg | ORAL_TABLET | Freq: Every day | ORAL | Status: DC

## 2015-09-17 MED ORDER — CRESTOR 5 MG PO TABS: 5.0000 mg | ORAL_TABLET | Freq: Every day | ORAL | Status: DC

## 2015-09-17 MED ORDER — AMLODIPINE BESYLATE 5 MG PO TABS: ORAL_TABLET | ORAL | Status: DC

## 2015-09-17 NOTE — Patient Instructions (Addendum)
Medicare Annual Wellness Visit  [place] and the medical providers at [facility] strive to bring you the best medical care.  In doing so we not only want to address your current medical conditions and concerns but also to detect new conditions early and prevent illness, disease and health-related problems.    Medicare offers a yearly Wellness Visit which allows our clinical staff to assess your need for preventative services including immunizations, lifestyle education, counseling to decrease risk of preventable diseases and screening for fall risk and other medical concerns.    This visit is provided free of charge (no copay) for all Medicare recipients. The clinical pharmacists at [facility] have begun to conduct these Wellness Visits which will also include a thorough review of all your medications.    As you primary medical provider recommend that you make an appointment for your Annual Wellness Visit if you have not done so already this year.  You may set up this appointment before you leave today or you may call back [phone]) and schedule an appointment.  Please make sure when you call that you mention that you are scheduling your Annual Wellness Visit with the clinical pharmacist so that the appointment may be made for the proper length of time.    Continue current medications. Continue good therapeutic lifestyle changes which include good diet and exercise. Fall precautions discussed with patient. If an FOBT was given today- please return it to our front desk. If you are over 3 years old - you may need Prevnar 31 or the adult Pneumonia vaccine.  **Flu shots are available--- please call and schedule a FLU-CLINIC appointment**  After your visit with [place] today you will receive a survey in the mail or online from [employer] regarding your care with [place]. Please take a moment to fill this out. Your feedback is very  important to [place] as you can help [place] better understand your patient needs as well as improve your experience and satisfaction. WE CARE ABOUT YOU!!!   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

## 2015-09-17 NOTE — Progress Notes (Signed)
Subjective:    Patient ID: Jimmy Loretha Stapler., male    DOB: [birth year], 49 y.o.   MRN: [id number]  HPI Patient is here today for annual wellness exam and follow up of chronic medical problems which includes hyperlipidemia and hypertension. He is taking medications regularly. The patient is concerned about some left shoulder pain and a bump on his abdomen. His lab work has been done but it is not back yet. He will be given an FOBT to return. He had a colonoscopy done this year. He is too young to receive the Prevnar vaccine. His blood pressure is improved from previous reading. The patient has had some urinary frequency but attributes this to drinking a lot more water. He does not have any nocturia. He denies chest pain shortness of breath trouble swallowing blood in the stool or black tarry bowel movements. He does have occasional indigestion and takes ranitidine only as needed for this. He is currently taking Naprosyn for his left shoulder. He understands he should protect his stomach while he is taking this. His family history was reviewed and his uncle on his father's side did die at an early age of heart disease. [place] has never had a stress test and we will arrange to do this since he does have some risk factors.     Patient Active Problem List   Diagnosis Date Noted  . Low back pain 12/06/2014  . Obstructive sleep apnea 11/06/2014  . Metabolic syndrome 0000000  . HTN (hypertension) 04/18/2013  . Hyperlipidemia 04/18/2013  . GERD (gastroesophageal reflux disease) 04/18/2013  . Obesity 04/18/2013   Outpatient Encounter Prescriptions as of 09/17/2015  Medication Sig  . acetaminophen (TYLENOL) 500 MG tablet Take 500 mg by mouth every 6 (six) hours as needed for pain.  Marland Kitchen amLODipine (NORVASC) 5 MG tablet Take 1 tablet by mouth  daily  . cholecalciferol (VITAMIN D) 1000 UNITS tablet Take 1,000 Units by mouth daily.  . cyclobenzaprine (FLEXERIL) 10 MG tablet Take 1 tablet (10 mg  total) by mouth 3 (three) times daily as needed for muscle spasms.  . diazepam (VALIUM) 10 MG tablet Take 10 mg by mouth at bedtime as needed for anxiety.  Marland Kitchen esomeprazole (NEXIUM) 40 MG capsule Take 1 capsule (40 mg total) by mouth daily.  Marland Kitchen losartan (COZAAR) 50 MG tablet Take 1 tablet (50 mg total) by mouth daily.  . naproxen (NAPROSYN) 500 MG tablet TAKE 1 TABLET BY MOUTH TWICE A DAY WITH FOOD  . omeprazole (PRILOSEC) 40 MG capsule Take 1 capsule by mouth  daily  . ranitidine (ZANTAC) 150 MG tablet Take 1 tablet (150 mg total) by mouth every evening. As needed. (Patient taking differently: Take 150 mg by mouth daily as needed for heartburn. As needed.)  . rosuvastatin (CRESTOR) 5 MG tablet Take 1 tablet (5 mg total) by mouth daily.  . [DISCONTINUED] Vitamin D, Ergocalciferol, (DRISDOL) 50000 UNITS CAPS capsule Take 1 capsule (50,000 Units total) by mouth every 7 (seven) days.   No facility-administered encounter medications on file as of 09/17/2015.      Review of Systems  Constitutional: Negative.   HENT: Negative.   Eyes: Negative.   Respiratory: Negative.   Cardiovascular: Negative.   Gastrointestinal: Negative.   Endocrine: Negative.   Genitourinary: Negative.   Musculoskeletal: Positive for arthralgias (left shoulder pain at times).  Skin: Negative.        "bump" on abdomen  Allergic/Immunologic: Negative.   Neurological: Negative.   Hematological: Negative.  Psychiatric/Behavioral: Negative.        Objective:   Physical Exam  Constitutional: He is oriented to person, place, and time. He appears well-developed and well-nourished. No distress.  HENT:  Head: Normocephalic and atraumatic.  Right Ear: External ear normal.  Left Ear: External ear normal.  Mouth/Throat: Oropharynx is clear and moist. No oropharyngeal exudate.  Nasal congestion and turbinate swelling bilaterally  Eyes: Conjunctivae and EOM are normal. Pupils are equal, round, and reactive to light. Right  eye exhibits no discharge. Left eye exhibits no discharge. No scleral icterus.  Had a recent eye exam that was normal  Neck: Normal range of motion. Neck supple. No thyromegaly present.  Without bruits or thyromegaly  Cardiovascular: Normal rate, regular rhythm, normal heart sounds and intact distal pulses.   No murmur heard. The rhythm is regular at 60/m  Pulmonary/Chest: Effort normal and breath sounds normal. No respiratory distress. He has no wheezes. He has no rales. He exhibits no tenderness.  Clear anteriorly and posteriorly and no axillary adenopathy  Abdominal: Soft. Bowel sounds are normal. He exhibits no mass. There is no tenderness. There is no rebound and no guarding.  No abdominal tenderness or masses  Genitourinary: Rectum normal and penis normal.  The prostate is slightly enlarged and smooth. There are no rectal masses. There were no inguinal hernias palpable in the external genitalia were within normal limits.  Musculoskeletal: Normal range of motion. He exhibits no edema.  Lymphadenopathy:    He has no cervical adenopathy.  Neurological: He is alert and oriented to person, place, and time. He has normal reflexes. No cranial nerve deficit.  Skin: Skin is warm and dry. No rash noted. No erythema.  Psychiatric: He has a normal mood and affect. His behavior is normal. Judgment and thought content normal.  Nursing note and vitals reviewed.  BP 136/88 mmHg  Pulse 62  Temp(Src) 97.5 F (36.4 C) (Oral)  Ht 5\' 7"  (1.702 m)  Wt 237 lb (107.502 kg)  BMI 37.11 kg/m2        Assessment & Plan:  1. Gastroesophageal reflux disease, esophagitis presence not specified -Take Zantac or ranitidine more regularly for the next several weeks because of taking anti-inflammatory medicines for the shoulder  2. Hyperlipidemia -Continue with aggressive therapeutic lifestyle changes and Crestor pending results of lab work  3. Essential hypertension -Continue with current blood pressure  medication and continue to watch sodium intake  4. Metabolic syndrome -Continue with weight loss efforts and drinking more water  5. Annual physical exam -The patient indicates he will be due another colonoscopy in a year following the previous one and he should make sure that he follows up on this. -He should return the FOBT. -We will arrange for him to have a stress test in the office because of his family history of heart disease  6. BPH  Patient Instructions                       Medicare Annual Wellness Visit  [facility] and the medical providers at [facility] strive to bring you the best medical care.  In doing so we not only want to address your current medical conditions and concerns but also to detect new conditions early and prevent illness, disease and health-related problems.    Medicare offers a yearly Wellness Visit which allows our clinical staff to assess your need for preventative services including immunizations, lifestyle education, counseling to decrease risk of preventable  diseases and screening for fall risk and other medical concerns.    This visit is provided free of charge (no copay) for all Medicare recipients. The clinical pharmacists at [facility] have begun to conduct these Wellness Visits which will also include a thorough review of all your medications.    As you primary medical provider recommend that you make an appointment for your Annual Wellness Visit if you have not done so already this year.  You may set up this appointment before you leave today or you may call back [phone] and schedule an appointment.  Please make sure when you call that you mention that you are scheduling your Annual Wellness Visit with the clinical pharmacist so that the appointment may be made for the proper length of time.    Continue current medications. Continue good therapeutic lifestyle changes which include good diet and  exercise. Fall precautions discussed with patient. If an FOBT was given today- please return it to our front desk. If you are over 66 years old - you may need Prevnar 88 or the adult Pneumonia vaccine.  **Flu shots are available--- please call and schedule a FLU-CLINIC appointment**  After your visit with [place] today you will receive a survey in the mail or online from [employer] regarding your care with [place]. Please take a moment to fill this out. Your feedback is very important to [place] as you can help [place] better understand your patient needs as well as improve your experience and satisfaction. WE CARE ABOUT YOU!!!   The patient should continue with his aggressive therapeutic lifestyle changes to include diet and exercise and sodium restriction He should continue with the medication he was started on for his shoulder make sure that he takes some Zantac to protect his stomach from taking anti-inflammatory medicine He should continue to drink plenty of fluids He should protect his airway more when he is mowing leaves and there is a lot of dust because of the nasal congestion seen on the exam today Continue to monitor blood pressures at home   Arrie Senate MD

## 2015-09-27 ENCOUNTER — Other Ambulatory Visit: Payer: Self-pay | Admitting: *Deleted

## 2015-09-27 DIAGNOSIS — E8881 Metabolic syndrome: Secondary | ICD-10-CM

## 2015-09-27 DIAGNOSIS — I10 Essential (primary) hypertension: Secondary | ICD-10-CM

## 2015-10-03 ENCOUNTER — Other Ambulatory Visit: Payer: Self-pay | Admitting: *Deleted

## 2015-10-03 MED ORDER — CRESTOR 5 MG PO TABS: 5.0000 mg | ORAL_TABLET | Freq: Every day | ORAL | Status: DC

## 2015-10-04 ENCOUNTER — Ambulatory Visit (INDEPENDENT_AMBULATORY_CARE_PROVIDER_SITE_OTHER): Payer: 59 | Admitting: Family Medicine

## 2015-10-04 DIAGNOSIS — I10 Essential (primary) hypertension: Secondary | ICD-10-CM

## 2015-10-04 DIAGNOSIS — E8881 Metabolic syndrome: Secondary | ICD-10-CM

## 2015-10-04 DIAGNOSIS — E785 Hyperlipidemia, unspecified: Secondary | ICD-10-CM | POA: Diagnosis not present

## 2015-10-04 DIAGNOSIS — E669 Obesity, unspecified: Secondary | ICD-10-CM | POA: Diagnosis not present

## 2015-10-04 DIAGNOSIS — G4733 Obstructive sleep apnea (adult) (pediatric): Secondary | ICD-10-CM | POA: Diagnosis not present

## 2015-10-04 LAB — EXERCISE TOLERANCE TEST
CHL CUP MPHR: 172 {beats}/min
CHL CUP RESTING HR STRESS: 59 {beats}/min
Estimated workload: 7.6 METS
Exercise duration (min): 6 min
Exercise duration (sec): 40 s
Peak HR: 144 {beats}/min
Percent HR: 85 %
RPE: 4

## 2015-10-04 NOTE — Progress Notes (Signed)
Exercise treadmill test performed and completed.

## 2015-10-05 NOTE — Addendum Note (Signed)
Addended by: Ilean China on: 10/05/2015 02:42 PM   Modules accepted: Level of Service

## 2015-10-18 ENCOUNTER — Encounter: Payer: Self-pay | Admitting: Family Medicine

## 2015-11-04 ENCOUNTER — Other Ambulatory Visit: Payer: Self-pay | Admitting: Family Medicine

## 2015-11-19 ENCOUNTER — Ambulatory Visit: Payer: 59 | Admitting: Internal Medicine

## 2015-11-19 ENCOUNTER — Encounter: Payer: Self-pay | Admitting: Internal Medicine

## 2015-11-19 ENCOUNTER — Ambulatory Visit (INDEPENDENT_AMBULATORY_CARE_PROVIDER_SITE_OTHER): Payer: 59 | Admitting: Internal Medicine

## 2015-11-19 VITALS — BP 128/78 | HR 71 | Ht 67.0 in | Wt 248.2 lb

## 2015-11-19 DIAGNOSIS — G4733 Obstructive sleep apnea (adult) (pediatric): Secondary | ICD-10-CM

## 2015-11-19 DIAGNOSIS — E669 Obesity, unspecified: Secondary | ICD-10-CM | POA: Diagnosis not present

## 2015-11-19 NOTE — Progress Notes (Signed)
11/06/14- 34 yoM nonsmoker referred for sleep evaluation courtesy of Dr Wonda Horner sleep study. Snoring, stops breathing through the night (per wife) and alot of moving around. Wife here. Snoring has been worse for the last year or so as he has gained weight. Usual bedtime around 10:30 PM, sleep latency 10 minutes, occasionally wakes during the night before up around 6:30. No ENT surgery, heart or lung disease. Treated for high blood pressure. Little caffeine, no sleep med. [facility] 11/24  05/17/15- 37 yoM nonsmoker referred for sleep evaluation courtesy of Dr Wonda Horner sleep study. Snoring, stops breathing through the night (per wife) and alot of moving around. Wife here. FOLLOWS FOR:DME-[facility], getting use to wearing cpap auto 5-20/[facility] it's better Unattended home sleep study 01/05/15 [facility] 77/ hr, desat to 73%, weight 253 lbs Using a nasal pillows mask. Sometimes auto titration pressure gets too high  11/19/2015-50 year old male nonsmoker followed for OSA complicated by HBP, GERD, obesity CPAP auto 8-15  FOLLOWS FOR: Pt states he is wearing CPAP every night for about 6-7 hours. DME: [facility]. No new supplies needed at this time.  Download shows adequate compliance excellent control. He missed a few nights when traveling. Sleeps better and feels better with CPAP.  ROS-see HPI Constitutional:   No-   weight loss, night sweats, fevers, chills, +fatigue, lassitude. HEENT:   No-  headaches, difficulty swallowing, tooth/dental problems, sore throat,       No-  sneezing, itching, ear ache, nasal congestion, post nasal drip,  CV:  No-   chest pain, orthopnea, PND, swelling in lower extremities, anasarca,                                                    dizziness, palpitations Resp: No-   shortness of breath with exertion or at rest.              No-   productive cough,  No non-productive cough,  No- coughing up of blood.              No-   change in color of mucus.  No- wheezing.   Skin: No-   rash or  lesions. GI:  +heartburn, indigestion, No-abdominal pain, nausea, vomiting,  GU:  MS:  No-   joint pain or swelling.   Neuro-     nothing unusual Psych:  No- change in mood or affect. No depression or anxiety.  No memory loss.  OBJ- Physical Exam     +obese General- Alert, Oriented, Affect-appropriate, Distress- none acute, + overweight Skin- rash-none, lesions- none, excoriation- none Lymphadenopathy- none Head- atraumatic            Eyes- Gross vision intact, PERRLA, conjunctivae and secretions clear            Ears- Hearing, canals-normal            Nose- Clear, no-Septal dev, mucus, polyps, erosion, perforation             Throat- Mallampati IV , mucosa clear , drainage- none, tonsils- atrophic Neck- flexible , trachea midline, no stridor , thyroid nl, carotid no bruit Chest - symmetrical excursion , unlabored           Heart/CV- RRR , no murmur , no gallop  , no rub, nl s1 s2                           -  JVD- none , edema- none, stasis changes- none, varices- none           Lung- clear to P&A, wheeze- none, cough- none , dullness-none, rub- none           Chest wall-  Abd-  Br/ Gen/ Rectal- Not done, not indicated Extrem- cyanosis- none, clubbing, none, atrophy- none, strength- nl Neuro- grossly intact to observation

## 2015-11-19 NOTE — Patient Instructions (Signed)
We can continue CPAP auto 8-15/ [facility]   Please call as needed

## 2015-11-25 NOTE — Assessment & Plan Note (Signed)
Encouraged him to make a weight loss effort, explaining benefits for sleep apnea and hypertension

## 2015-11-25 NOTE — Assessment & Plan Note (Signed)
Download confirms good compliance and control and he says he feels better with CPAP

## 2015-11-30 ENCOUNTER — Encounter: Payer: Self-pay | Admitting: Internal Medicine

## 2015-12-17 ENCOUNTER — Other Ambulatory Visit: Payer: Self-pay | Admitting: Family Medicine

## 2015-12-17 ENCOUNTER — Other Ambulatory Visit: Payer: Self-pay | Admitting: Nurse Practitioner

## 2015-12-17 DIAGNOSIS — M5441 Lumbago with sciatica, right side: Secondary | ICD-10-CM

## 2015-12-17 MED ORDER — CYCLOBENZAPRINE HCL 10 MG PO TABS: 10.0000 mg | ORAL_TABLET | Freq: Three times a day (TID) | ORAL | Status: DC | PRN

## 2015-12-17 MED ORDER — NAPROXEN 500 MG PO TABS: 500.0000 mg | ORAL_TABLET | Freq: Two times a day (BID) | ORAL | Status: DC

## 2015-12-27 ENCOUNTER — Encounter: Payer: Self-pay | Admitting: Internal Medicine

## 2016-01-18 ENCOUNTER — Ambulatory Visit (INDEPENDENT_AMBULATORY_CARE_PROVIDER_SITE_OTHER): Payer: 59 | Admitting: Family Medicine

## 2016-01-18 ENCOUNTER — Ambulatory Visit (INDEPENDENT_AMBULATORY_CARE_PROVIDER_SITE_OTHER): Payer: 59

## 2016-01-18 ENCOUNTER — Encounter: Payer: Self-pay | Admitting: Family Medicine

## 2016-01-18 VITALS — BP 147/67 | HR 79 | Temp 97.9°F | Ht 67.0 in | Wt 247.0 lb

## 2016-01-18 DIAGNOSIS — M19019 Primary osteoarthritis, unspecified shoulder: Secondary | ICD-10-CM

## 2016-01-18 DIAGNOSIS — E785 Hyperlipidemia, unspecified: Secondary | ICD-10-CM

## 2016-01-18 DIAGNOSIS — N400 Enlarged prostate without lower urinary tract symptoms: Secondary | ICD-10-CM

## 2016-01-18 DIAGNOSIS — M25512 Pain in left shoulder: Secondary | ICD-10-CM

## 2016-01-18 DIAGNOSIS — J069 Acute upper respiratory infection, unspecified: Secondary | ICD-10-CM | POA: Diagnosis not present

## 2016-01-18 DIAGNOSIS — M129 Arthropathy, unspecified: Secondary | ICD-10-CM | POA: Diagnosis not present

## 2016-01-18 DIAGNOSIS — Z1211 Encounter for screening for malignant neoplasm of colon: Secondary | ICD-10-CM

## 2016-01-18 DIAGNOSIS — I10 Essential (primary) hypertension: Secondary | ICD-10-CM

## 2016-01-18 DIAGNOSIS — K219 Gastro-esophageal reflux disease without esophagitis: Secondary | ICD-10-CM

## 2016-01-18 DIAGNOSIS — E559 Vitamin D deficiency, unspecified: Secondary | ICD-10-CM

## 2016-01-18 MED ORDER — RANITIDINE HCL 150 MG PO TABS: 150.0000 mg | ORAL_TABLET | Freq: Two times a day (BID) | ORAL | Status: DC | PRN

## 2016-01-18 MED ORDER — FLUTICASONE PROPIONATE 50 MCG/ACT NA SUSP: 2.0000 | Freq: Every day | NASAL | Status: DC

## 2016-01-18 MED ORDER — ESOMEPRAZOLE MAGNESIUM 40 MG PO CPDR: 40.0000 mg | DELAYED_RELEASE_CAPSULE | Freq: Every day | ORAL | Status: DC

## 2016-01-18 MED ORDER — AMOXICILLIN 500 MG PO CAPS: 500.0000 mg | ORAL_CAPSULE | Freq: Three times a day (TID) | ORAL | Status: DC

## 2016-01-18 NOTE — Progress Notes (Signed)
Subjective:    Patient ID: Jimmy Loretha Stapler., male    DOB: [birth year], 50 y.o.   MRN: [id number]  HPI Pt here for follow up and management of chronic medical problems which includes hyperlipidemia and hypertension. He is taking medications regularly.This patient has a BMI of over 38 and 2 comorbidities. He's been having some jaw pain and soreness for 3-5 days and some pain in the left shoulder. He is going to return to the clinic fasting for his lab work. He'll be given an FOBT to return today. He is requesting refills on 2 of his medicines. The patient has been working on building a chicken coop at home and since starting that project he is noticed the problems with his left shoulder. It is in front of the shoulder where the pain is. Over year ago he also had problems with this. He otherwise denies any chest pain or shortness of breath. His GI tract is fine with no nausea vomiting diarrhea blood in the stool black tarry bowel movements or trouble swallowing. His bowels are moving regularly and have a normal consistency and color. His passing his water without problems. As far as his weight is concerned he is not drinking any soda drinks. He does eat a lot of bread. This winter he says that he has not been as active with walking and outdoor activity. His weight is up 17 pounds as he said it was 2:30 and outs 247. We discussed visiting with the nutritionist here in the office to see if she can help plan a better diet for him and an exercise regimen for him to help lose more weight. With his 2 comorbidities he would be considered morbidly obese. He did have a cold or upper respiratory infection a couple weeks ago and he complains with his neck being sore bilaterally below the mandibles in the anterior cervical area.      Patient Active Problem List   Diagnosis Date Noted  . Low back pain 12/06/2014  . Obstructive sleep apnea 11/06/2014  . Metabolic syndrome 40/98/1191  . HTN (hypertension)  04/18/2013  . Hyperlipidemia 04/18/2013  . GERD (gastroesophageal reflux disease) 04/18/2013  . Obesity 04/18/2013   Outpatient Encounter Prescriptions as of 01/18/2016  Medication Sig  . amLODipine (NORVASC) 5 MG tablet Take 1 tablet by mouth  daily  . cholecalciferol (VITAMIN D) 1000 UNITS tablet Take 1,000 Units by mouth daily.  . cyclobenzaprine (FLEXERIL) 10 MG tablet Take 1 tablet (10 mg total) by mouth 3 (three) times daily as needed for muscle spasms.  Marland Kitchen esomeprazole (NEXIUM) 40 MG capsule Take 1 capsule (40 mg total) by mouth daily.  Marland Kitchen losartan (COZAAR) 50 MG tablet Take 1 tablet (50 mg total) by mouth daily.  . naproxen (NAPROSYN) 500 MG tablet Take 1 tablet (500 mg total) by mouth 2 (two) times daily with a meal.  . Omega-3 Fatty Acids (FISH OIL) 1000 MG CAPS Take 2 capsules by mouth.  Marland Kitchen omeprazole (PRILOSEC) 40 MG capsule Take 1 capsule by mouth  daily  . ranitidine (ZANTAC) 150 MG tablet Take 1 tablet (150 mg total) by mouth 2 (two) times daily as needed for heartburn. As needed.  . [DISCONTINUED] diazepam (VALIUM) 10 MG tablet Take 10 mg by mouth at bedtime as needed for anxiety.  . [DISCONTINUED] esomeprazole (NEXIUM) 40 MG capsule Take 1 capsule (40 mg total) by mouth daily.  . [DISCONTINUED] ranitidine (ZANTAC) 150 MG tablet Take 1 tablet (150 mg total) by mouth every  evening. As needed. (Patient taking differently: Take 150 mg by mouth daily as needed for heartburn. As needed.)  . acetaminophen (TYLENOL) 500 MG tablet Take 500 mg by mouth every 6 (six) hours as needed for pain. Reported on 01/18/2016  . [DISCONTINUED] CRESTOR 5 MG tablet Take 1 tablet (5 mg total) by mouth daily.   No facility-administered encounter medications on file as of 01/18/2016.      Review of Systems  Constitutional: Negative.   HENT: Negative.        Jaw pain - x 3 -5 days  Eyes: Negative.   Respiratory: Negative.   Cardiovascular: Negative.   Gastrointestinal: Negative.   Endocrine:  Negative.   Genitourinary: Negative.   Musculoskeletal: Positive for arthralgias (left shoulder).  Skin: Negative.   Allergic/Immunologic: Negative.   Neurological: Negative.   Hematological: Negative.   Psychiatric/Behavioral: Negative.        Objective:   Physical Exam  Constitutional: He is oriented to person, place, and time. He appears well-developed and well-nourished. No distress.  HENT:  Head: Normocephalic and atraumatic.  Right Ear: External ear normal.  Left Ear: External ear normal.  Mouth/Throat: Oropharynx is clear and moist. No oropharyngeal exudate.  Nasal congestion and turbinate swelling bilaterally  Eyes: Conjunctivae and EOM are normal. Pupils are equal, round, and reactive to light. Right eye exhibits no discharge. Left eye exhibits no discharge. No scleral icterus.  Neck: Normal range of motion. Neck supple. No thyromegaly present.  No anterior cervical adenopathy  Cardiovascular: Normal rate, regular rhythm, normal heart sounds and intact distal pulses.   No murmur heard. Pulmonary/Chest: Effort normal and breath sounds normal. No respiratory distress. He has no wheezes. He has no rales. He exhibits no tenderness.  Clear anteriorly and posteriorly  Abdominal: Soft. Bowel sounds are normal. He exhibits no mass. There is no tenderness. There is no rebound and no guarding.  Abdominal obesity without masses or tenderness  Musculoskeletal: Normal range of motion. He exhibits no edema or tenderness.  Lymphadenopathy:    He has no cervical adenopathy.  Neurological: He is alert and oriented to person, place, and time. He has normal reflexes. No cranial nerve deficit.  Skin: Skin is warm and dry. No rash noted.  Psychiatric: He has a normal mood and affect. His behavior is normal. Judgment and thought content normal.  Nursing note and vitals reviewed.  BP 147/67 mmHg  Pulse 79  Temp(Src) 97.9 F (36.6 C) (Oral)  Ht '5\' 7"'  (1.702 m)  Wt 247 lb (112.038 kg)  BMI  38.68 kg/m2        Assessment & Plan:  1. Gastroesophageal reflux disease, esophagitis presence not specified -The patient is doing well with his stomach with no complaints of heartburn or indigestion - esomeprazole (NEXIUM) 40 MG capsule; Take 1 capsule (40 mg total) by mouth daily.  Dispense: 90 capsule; Refill: 3 - CBC with Differential/Platelet; Future - Hepatic function panel; Future  2. Hyperlipidemia -Continue with omega-3 fatty acids and aggressive therapeutic lifestyle changes pending results of lab work - CBC with Differential/Platelet; Future - Lipid panel; Future  3. Essential hypertension -The blood pressure slightly elevated today but the patient says his home readings are in the 130s over the 80s. - BMP8+EGFR; Future - CBC with Differential/Platelet; Future - Hepatic function panel; Future  4. BPH (benign prostatic hyperplasia) -No symptoms with passing his water. - CBC with Differential/Platelet; Future  5. Vitamin D deficiency -Continue current vitamin D replacement pending results of lab work -  CBC with Differential/Platelet; Future - VITAMIN D 25 Hydroxy (Vit-D Deficiency, Fractures); Future  6. Special screening for malignant neoplasms, colon -He has not seen any gross blood in the stool but he will return the FOBT card. - Fecal occult blood, imunochemical; Future - CBC with Differential/Platelet; Future  7. Left shoulder pain -This is most likely a left acromioclavicular arthritis. We will wait for the x-rays are returned and the patient is not better in a couple weeks we will have him come back and inject the left acromioclavicular joint. - DG Shoulder Left; Future  8. URI (upper respiratory infection) -Take amoxicillin 503 times daily until completed -Use nasal saline and Flonase as directed  9. Acromioclavicular joint arthritis -Use warm compresses to the left shoulder  10. Morbid obesity due to excess calories [facility]) -The patient has got to  work aggressively on losing the weight. His comorbidities and his weight put him in the morbid obesity category. -We will schedule him to meet with the clinical pharmacist to discuss diet and exercise as a way to help him lose more weight.  Meds ordered this encounter  Medications  . Omega-3 Fatty Acids (FISH OIL) 1000 MG CAPS    Sig: Take 2 capsules by mouth.  . esomeprazole (NEXIUM) 40 MG capsule    Sig: Take 1 capsule (40 mg total) by mouth daily.    Dispense:  90 capsule    Refill:  3  . ranitidine (ZANTAC) 150 MG tablet    Sig: Take 1 tablet (150 mg total) by mouth 2 (two) times daily as needed for heartburn. As needed.    Dispense:  180 tablet    Refill:  3  . amoxicillin (AMOXIL) 500 MG capsule    Sig: Take 1 capsule (500 mg total) by mouth 3 (three) times daily.    Dispense:  30 capsule    Refill:  0  . fluticasone (FLONASE) 50 MCG/ACT nasal spray    Sig: Place 2 sprays into both nostrils daily.    Dispense:  16 g    Refill:  6   Patient Instructions                       Medicare Annual Wellness Visit  [facility] and the medical providers at [facility] strive to bring you the best medical care.  In doing so we not only want to address your current medical conditions and concerns but also to detect new conditions early and prevent illness, disease and health-related problems.    Medicare offers a yearly Wellness Visit which allows our clinical staff to assess your need for preventative services including immunizations, lifestyle education, counseling to decrease risk of preventable diseases and screening for fall risk and other medical concerns.    This visit is provided free of charge (no copay) for all Medicare recipients. The clinical pharmacists at [facility] have begun to conduct these Wellness Visits which will also include a thorough review of all your medications.    As you primary medical provider recommend that you  make an appointment for your Annual Wellness Visit if you have not done so already this year.  You may set up this appointment before you leave today or you may call back [phone]) and schedule an appointment.  Please make sure when you call that you mention that you are scheduling your Annual Wellness Visit with the clinical pharmacist so that the appointment may be made for the proper  length of time.     Continue current medications. Continue good therapeutic lifestyle changes which include good diet and exercise. Fall precautions discussed with patient. If an FOBT was given today- please return it to our front desk. If you are over 36 years old - you may need Prevnar 26 or the adult Pneumonia vaccine.  **Flu shots are available--- please call and schedule a FLU-CLINIC appointment**  After your visit with [place] today you will receive a survey in the mail or online from [employer] regarding your care with [place]. Please take a moment to fill this out. Your feedback is very important to [place] as you can help [place] better understand your patient needs as well as improve your experience and satisfaction. WE CARE ABOUT YOU!!!   Use warm wet compresses to left anterior shoulder, if possible 20 minutes 3 or 4 times daily Return to clinic in a couple weeks and if pain persists we will give him a shot of cortisone into the joint area of the acromioclavicular joint Increase physical activity with walking and exercise Continue to drink plenty of water Reduce the intake of carbohydrates and especially bread Take antibiotic as directed and until completed Use Flonase nightly 1 spray each nostril Use nasal saline during the day We will arrange a visit with the clinical pharmacist to discuss diet and physical activity to help with weight loss   Arrie Senate MD

## 2016-01-18 NOTE — Patient Instructions (Addendum)
Medicare Annual Wellness Visit  [facility] and the medical providers at [facility] strive to bring you the best medical care.  In doing so we not only want to address your current medical conditions and concerns but also to detect new conditions early and prevent illness, disease and health-related problems.    Medicare offers a yearly Wellness Visit which allows our clinical staff to assess your need for preventative services including immunizations, lifestyle education, counseling to decrease risk of preventable diseases and screening for fall risk and other medical concerns.    This visit is provided free of charge (no copay) for all Medicare recipients. The clinical pharmacists at [facility] have begun to conduct these Wellness Visits which will also include a thorough review of all your medications.    As you primary medical provider recommend that you make an appointment for your Annual Wellness Visit if you have not done so already this year.  You may set up this appointment before you leave today or you may call back [phone] and schedule an appointment.  Please make sure when you call that you mention that you are scheduling your Annual Wellness Visit with the clinical pharmacist so that the appointment may be made for the proper length of time.     Continue current medications. Continue good therapeutic lifestyle changes which include good diet and exercise. Fall precautions discussed with patient. If an FOBT was given today- please return it to our front desk. If you are over 11 years old - you may need Prevnar 36 or the adult Pneumonia vaccine.  **Flu shots are available--- please call and schedule a FLU-CLINIC appointment**  After your visit with [place] today you will receive a survey in the mail or online from [employer] regarding your care with [place]. Please take a moment to fill this out. Your feedback is very  important to [place] as you can help [place] better understand your patient needs as well as improve your experience and satisfaction. WE CARE ABOUT YOU!!!   Use warm wet compresses to left anterior shoulder, if possible 20 minutes 3 or 4 times daily Return to clinic in a couple weeks and if pain persists we will give him a shot of cortisone into the joint area of the acromioclavicular joint Increase physical activity with walking and exercise Continue to drink plenty of water Reduce the intake of carbohydrates and especially bread Take antibiotic as directed and until completed Use Flonase nightly 1 spray each nostril Use nasal saline during the day We will arrange a visit with the clinical pharmacist to discuss diet and physical activity to help with weight loss

## 2016-01-19 ENCOUNTER — Other Ambulatory Visit: Payer: 59

## 2016-01-19 DIAGNOSIS — E785 Hyperlipidemia, unspecified: Secondary | ICD-10-CM

## 2016-01-19 DIAGNOSIS — K219 Gastro-esophageal reflux disease without esophagitis: Secondary | ICD-10-CM

## 2016-01-19 DIAGNOSIS — I10 Essential (primary) hypertension: Secondary | ICD-10-CM

## 2016-01-19 DIAGNOSIS — N400 Enlarged prostate without lower urinary tract symptoms: Secondary | ICD-10-CM

## 2016-01-19 DIAGNOSIS — E559 Vitamin D deficiency, unspecified: Secondary | ICD-10-CM

## 2016-01-19 DIAGNOSIS — Z1211 Encounter for screening for malignant neoplasm of colon: Secondary | ICD-10-CM

## 2016-01-21 ENCOUNTER — Encounter: Payer: Self-pay | Admitting: Pediatrics

## 2016-01-21 ENCOUNTER — Ambulatory Visit (INDEPENDENT_AMBULATORY_CARE_PROVIDER_SITE_OTHER): Payer: 59 | Admitting: Pediatrics

## 2016-01-21 VITALS — BP 126/72 | HR 58 | Temp 96.5°F | Ht 67.0 in | Wt 249.0 lb

## 2016-01-21 DIAGNOSIS — L049 Acute lymphadenitis, unspecified: Secondary | ICD-10-CM | POA: Diagnosis not present

## 2016-01-21 LAB — VITAMIN D 25 HYDROXY (VIT D DEFICIENCY, FRACTURES): VIT D 25 HYDROXY: 24.8 ng/mL — AB (ref 30.0–100.0)

## 2016-01-21 LAB — LIPID PANEL
Chol/HDL Ratio: 5.5 ratio units — ABNORMAL HIGH (ref 0.0–5.0)
Cholesterol, Total: 191 mg/dL (ref 100–199)
HDL: 35 mg/dL — AB (ref >39)
LDL Calculated: 90 mg/dL (ref 0–99)
TRIGLYCERIDES: 328 mg/dL — AB (ref 0–149)
VLDL CHOLESTEROL CAL: 66 mg/dL — AB (ref 5–40)

## 2016-01-21 LAB — BMP8+EGFR
BUN / CREAT RATIO: 14 (ref 9–20)
BUN: 14 mg/dL (ref 6–24)
CALCIUM: 9.9 mg/dL (ref 8.7–10.2)
CHLORIDE: 100 mmol/L (ref 96–106)
CO2: 24 mmol/L (ref 18–29)
CREATININE: 0.99 mg/dL (ref 0.76–1.27)
GFR calc Af Amer: 103 mL/min/{1.73_m2} (ref >59)
GFR calc non Af Amer: 89 mL/min/{1.73_m2} (ref >59)
GLUCOSE: 126 mg/dL — AB (ref 65–99)
Potassium: 4.8 mmol/L (ref 3.5–5.2)
Sodium: 143 mmol/L (ref 134–144)

## 2016-01-21 LAB — HEPATIC FUNCTION PANEL
ALT: 29 IU/L (ref 0–44)
AST: 15 IU/L (ref 0–40)
Albumin: 4.8 g/dL (ref 3.5–5.5)
Alkaline Phosphatase: 70 IU/L (ref 39–117)
Bilirubin Total: 0.6 mg/dL (ref 0.0–1.2)
Bilirubin, Direct: 0.14 mg/dL (ref 0.00–0.40)
TOTAL PROTEIN: 6.9 g/dL (ref 6.0–8.5)

## 2016-01-21 LAB — CBC WITH DIFFERENTIAL/PLATELET
BASOS ABS: 0 10*3/uL (ref 0.0–0.2)
Basos: 1 %
EOS (ABSOLUTE): 0.2 10*3/uL (ref 0.0–0.4)
EOS: 3 %
HEMATOCRIT: 45.6 % (ref 37.5–51.0)
HEMOGLOBIN: 15.8 g/dL (ref 12.6–17.7)
IMMATURE GRANS (ABS): 0 10*3/uL (ref 0.0–0.1)
IMMATURE GRANULOCYTES: 0 %
LYMPHS: 32 %
Lymphocytes Absolute: 2.1 10*3/uL (ref 0.7–3.1)
MCH: 32.4 pg (ref 26.6–33.0)
MCHC: 34.6 g/dL (ref 31.5–35.7)
MCV: 93 fL (ref 79–97)
MONOCYTES: 12 %
Monocytes Absolute: 0.8 10*3/uL (ref 0.1–0.9)
NEUTROS PCT: 52 %
Neutrophils Absolute: 3.4 10*3/uL (ref 1.4–7.0)
Platelets: 292 10*3/uL (ref 150–379)
RBC: 4.88 x10E6/uL (ref 4.14–5.80)
RDW: 13.3 % (ref 12.3–15.4)
WBC: 6.5 10*3/uL (ref 3.4–10.8)

## 2016-01-21 MED ORDER — AMOXICILLIN-POT CLAVULANATE 875-125 MG PO TABS: 1.0000 | ORAL_TABLET | Freq: Two times a day (BID) | ORAL | Status: DC

## 2016-01-21 NOTE — Progress Notes (Signed)
    Subjective:    Patient ID: Jimmy Perez., male    DOB: [birth year], 50 y.o.   MRN: [id number]  CC: Ear Pain and Sore Throat   HPI: Messi Temarion Hanlin. is a 50 y.o. male presenting for Ear Pain and Sore Throat  R ear pain starting yesterday Was seen four days ago for URI symptoms then, also with some fullness R side of neck and under jaw. Started on aomxicillin No fevers but now with pain in neck/under jaw as he swallows, seems to be getting worse.   Relevant past medical, surgical, family and social history reviewed and updated as indicated. Interim medical history since our last visit reviewed. Allergies and medications reviewed and updated.    ROS: Per HPI unless specifically indicated above  History  Smoking status  . Never Smoker   Smokeless tobacco  . Current User  . Types: Snuff    Comment: off an on (pouches) of snuff       Objective:    BP 126/72 mmHg  Pulse 58  Temp(Src) 96.5 F (35.8 C) (Oral)  Ht 5\' 7"  (1.702 m)  Wt 249 lb (112.946 kg)  BMI 38.99 kg/m2  Wt Readings from Last 3 Encounters:  01/21/16 249 lb (112.946 kg)  01/18/16 247 lb (112.038 kg)  11/19/15 248 lb 3.2 oz (112.583 kg)     Gen: NAD, alert, cooperative with exam, NCAT EYES: EOMI, no scleral injection or icterus ENT:  TMs pearly gray b/l, R ear canal with small Red excoriation with surrounding erythema, rest of canal is normal. OP without erythema. Fullness R side at jaw angle compared to L, no discrete lymph nodes felt but limited by body habitus. No redness. Mildly tender with palpation. CV: NRRR, normal S1/S2, no murmur, distal pulses 2+ b/l Resp: CTABL, no wheezes, normal WOB Abd: +BS, soft, NTND. Ext: No edema, warm Neuro: Alert and oriented     Assessment & Plan:    Trasean was seen today for ear pain. Does have excoriation R ear canal, worsening pain in R side of neck. Discussed could be viral, does have some fullness that could be LAD. Will broaden antibiotics.    Diagnoses and all orders for this visit:  Lymphadenitis, acute -     amoxicillin-clavulanate (AUGMENTIN) 875-125 MG tablet; Take 1 tablet by mouth 2 (two) times daily.   Follow up plan: Return if symptoms worsen or fail to improve.  Assunta Found, MD [facility] 01/21/2016, 8:30 AM

## 2016-01-22 ENCOUNTER — Other Ambulatory Visit: Payer: Self-pay | Admitting: *Deleted

## 2016-01-22 MED ORDER — VITAMIN D (ERGOCALCIFEROL) 1.25 MG (50000 UNIT) PO CAPS: 50000.0000 [IU] | ORAL_CAPSULE | ORAL | Status: DC

## 2016-01-23 LAB — HGB A1C W/O EAG: HEMOGLOBIN A1C: 7.1 % — AB (ref 4.8–5.6)

## 2016-01-23 LAB — SPECIMEN STATUS REPORT

## 2016-02-06 ENCOUNTER — Encounter: Payer: Self-pay | Admitting: Internal Medicine

## 2016-02-10 ENCOUNTER — Other Ambulatory Visit: Payer: Self-pay | Admitting: Family Medicine

## 2016-02-15 ENCOUNTER — Ambulatory Visit: Payer: 59 | Admitting: Pharmacist

## 2016-02-27 ENCOUNTER — Ambulatory Visit: Payer: 59 | Admitting: Internal Medicine

## 2016-03-18 ENCOUNTER — Other Ambulatory Visit: Payer: Self-pay | Admitting: *Deleted

## 2016-03-18 MED ORDER — ATORVASTATIN CALCIUM 10 MG PO TABS: 10.0000 mg | ORAL_TABLET | Freq: Every day | ORAL | Status: DC

## 2016-03-18 NOTE — Telephone Encounter (Signed)
[facility] changes to generic lipitor 10 mg for cost - this approved by [facility]

## 2016-03-19 ENCOUNTER — Encounter: Payer: Self-pay | Admitting: Physician Assistant

## 2016-03-19 ENCOUNTER — Ambulatory Visit (INDEPENDENT_AMBULATORY_CARE_PROVIDER_SITE_OTHER): Payer: 59 | Admitting: Physician Assistant

## 2016-03-19 VITALS — BP 143/92 | HR 60 | Temp 97.2°F | Ht 67.0 in | Wt 240.8 lb

## 2016-03-19 DIAGNOSIS — M7711 Lateral epicondylitis, right elbow: Secondary | ICD-10-CM | POA: Diagnosis not present

## 2016-03-19 MED ORDER — METHYLPREDNISOLONE ACETATE 80 MG/ML IJ SUSP
80.0000 mg | Freq: Once | INTRAMUSCULAR | Status: AC
  Administered 2016-03-19: 80 mg via INTRA_ARTICULAR

## 2016-03-19 NOTE — Progress Notes (Signed)
Subjective:     Patient ID: Jimmy Perez., male   DOB: 1966/01/22, 51 y.o.   MRN: [id number]  HPI Pt with R elbow pain Hx of lateral epicondylitis requiring injection This helped sx for ~ 1 yr Using strap at work and during softball season  Review of Systems + pain to the lateral elbow No numbness No swelling Sx worse with certain movements    Objective:   Physical Exam NAD No ecchy/edema to the R elbow FROM of the elbow Good grip strength but reproduces sx ++ TTP of the lateral epicondyle Offered injection Consent obtained Area cleansed with Betadine 1/2cc Marcaine 1/2cc DepoMedrol injected under sterile technique Dressing placed    Assessment:     1. Tennis elbow, right        Plan:     Heat/Ice OTC NSAIDS Continue with strap during activities F/U prn

## 2016-06-19 ENCOUNTER — Telehealth: Payer: Self-pay

## 2016-06-19 NOTE — Telephone Encounter (Signed)
Recall EUS for October  abnormal stomach note sent to myself to set up

## 2016-07-23 ENCOUNTER — Telehealth: Payer: Self-pay

## 2016-07-23 NOTE — Telephone Encounter (Signed)
-----   Message from Barron Alvine, RN sent at 06/19/2016  2:59 PM EDT ----- Pt needs recall EUS October for abnormal stomach

## 2016-07-24 NOTE — Telephone Encounter (Signed)
The pt would like to discuss with his wife and call back next week if he decides to schedule.

## 2016-08-01 ENCOUNTER — Ambulatory Visit (INDEPENDENT_AMBULATORY_CARE_PROVIDER_SITE_OTHER): Payer: 59 | Admitting: Family Medicine

## 2016-08-01 ENCOUNTER — Encounter: Payer: Self-pay | Admitting: Family Medicine

## 2016-08-01 ENCOUNTER — Ambulatory Visit (INDEPENDENT_AMBULATORY_CARE_PROVIDER_SITE_OTHER): Payer: 59

## 2016-08-01 VITALS — BP 128/72 | HR 72 | Temp 97.7°F | Ht 67.0 in | Wt 238.0 lb

## 2016-08-01 DIAGNOSIS — E559 Vitamin D deficiency, unspecified: Secondary | ICD-10-CM

## 2016-08-01 DIAGNOSIS — I10 Essential (primary) hypertension: Secondary | ICD-10-CM

## 2016-08-01 DIAGNOSIS — E784 Other hyperlipidemia: Secondary | ICD-10-CM

## 2016-08-01 DIAGNOSIS — K219 Gastro-esophageal reflux disease without esophagitis: Secondary | ICD-10-CM | POA: Diagnosis not present

## 2016-08-01 DIAGNOSIS — E7849 Other hyperlipidemia: Secondary | ICD-10-CM

## 2016-08-01 DIAGNOSIS — M7711 Lateral epicondylitis, right elbow: Secondary | ICD-10-CM

## 2016-08-01 NOTE — Patient Instructions (Addendum)
Continue current medications. Continue good therapeutic lifestyle changes which include good diet and exercise. Fall precautions discussed with patient. If an FOBT was given today- please return it to our front desk. If you are over 49 years old - you may need Prevnar 8 or the adult Pneumonia vaccine.  **Flu shots are available--- please call and schedule a FLU-CLINIC appointment**  After your visit with [place] today you will receive a survey in the mail or online from [employer] regarding your care with [place]. Please take a moment to fill this out. Your feedback is very important to [place] as you can help [place] better understand your patient needs as well as improve your experience and satisfaction. WE CARE ABOUT YOU!!!   Continue to get your flu shot Dr. Elmo Putt will do your next colonoscopy in 2021 in the spring that will be 5 years after the one you had in the spring of 2016. Follow-up with pulmonologist for sleep apnea as needed Follow-up with ear nose and throat and make sure that we get a note from them at your next visit

## 2016-08-01 NOTE — Progress Notes (Signed)
Subjective:    Patient ID: Jimmy Perez., male    DOB: [birth year], 50 y.o.   MRN: [id number]  HPI Pt here for follow up and management of chronic medical problems which includes hypertension and hyperlipemia. He is taking medications regularly.The patient comes in today with no complaints. He is taking atorvastatin amlodipine and low Sartin for controlling his cholesterol and blood pressure. He also takes a PPI for his reflux. He is up-to-date on his colonoscopies. He has no specific complaints. He had a stress test done in December 2016. He is going to wait on the flu shot. He will get a chest x-ray as he leaves office today. The patient is doing well overall today. His wife is a Marine scientist and she keeps track of his blood sugars and blood pressures. He has had a colonoscopy in the spring of 2016. He does not remember who did this but also does not remember when he needs to get the next 1. He denies any chest pain or shortness of breath. As long as he takes his proton pump inhibitor he does well with controlling his reflux. He has not seen any blood in the stool or had any black tarry bowel movements. He is passing his water without problems. His tennis elbow on the right side has not flared up as much as it has in the past and he is doing better with this also. He also reminded me about his sleep apnea and with his CPAP machine he is felt better while using this.    Patient Active Problem List   Diagnosis Date Noted  . Low back pain 12/06/2014  . Obstructive sleep apnea 11/06/2014  . Metabolic syndrome 24/26/8341  . HTN (hypertension) 04/18/2013  . Hyperlipidemia 04/18/2013  . GERD (gastroesophageal reflux disease) 04/18/2013  . Obesity 04/18/2013   Outpatient Encounter Prescriptions as of 08/01/2016  Medication Sig  . acetaminophen (TYLENOL) 500 MG tablet Take 500 mg by mouth every 6 (six) hours as needed for pain. Reported on 01/18/2016  . amLODipine (NORVASC) 5 MG tablet Take 1 tablet by  mouth  daily  . atorvastatin (LIPITOR) 10 MG tablet Take 1 tablet (10 mg total) by mouth daily.  . cholecalciferol (VITAMIN D) 1000 UNITS tablet Take 1,000 Units by mouth daily.  . cyclobenzaprine (FLEXERIL) 10 MG tablet Take 1 tablet (10 mg total) by mouth 3 (three) times daily as needed for muscle spasms.  Marland Kitchen esomeprazole (NEXIUM) 40 MG capsule Take 1 capsule (40 mg total) by mouth daily.  . fluticasone (FLONASE) 50 MCG/ACT nasal spray Place 2 sprays into both nostrils daily.  Marland Kitchen losartan (COZAAR) 50 MG tablet Take 1 tablet (50 mg total) by mouth daily.  . naproxen (NAPROSYN) 500 MG tablet Take 1 tablet (500 mg total) by mouth 2 (two) times daily with a meal.  . omeprazole (PRILOSEC) 40 MG capsule Take 1 capsule by mouth  daily  . Vitamin D, Ergocalciferol, (DRISDOL) 50000 units CAPS capsule Take 1 capsule (50,000 Units total) by mouth every 7 (seven) days.  . [DISCONTINUED] Omega-3 Fatty Acids (FISH OIL) 1000 MG CAPS Take 2 capsules by mouth.   No facility-administered encounter medications on file as of 08/01/2016.       Review of Systems  Constitutional: Negative.   HENT: Negative.   Eyes: Negative.   Respiratory: Negative.   Cardiovascular: Negative.   Gastrointestinal: Negative.   Endocrine: Negative.   Genitourinary: Negative.   Musculoskeletal: Negative.   Skin: Negative.   Allergic/Immunologic: Negative.  Neurological: Negative.   Hematological: Negative.   Psychiatric/Behavioral: Negative.        Objective:   Physical Exam  Constitutional: He is oriented to person, place, and time. He appears well-developed and well-nourished. No distress.  Pleasant and alert  HENT:  Head: Normocephalic and atraumatic.  Right Ear: External ear normal.  Left Ear: External ear normal.  Mouth/Throat: Oropharynx is clear and moist. No oropharyngeal exudate.  Nasal congestion and turbinate swelling bilaterally  Eyes: Conjunctivae and EOM are normal. Pupils are equal, round, and  reactive to light. Right eye exhibits no discharge. Left eye exhibits no discharge. No scleral icterus.  Neck: Normal range of motion. Neck supple. No thyromegaly present.  No bruits thyromegaly or anterior cervical adenopathy  Cardiovascular: Normal rate, regular rhythm, normal heart sounds and intact distal pulses.   No murmur heard. Heart has a regular rate and rhythm at 72/m  Pulmonary/Chest: Effort normal and breath sounds normal. No respiratory distress. He has no wheezes. He has no rales. He exhibits no tenderness.  No axillary adenopathy  Abdominal: Soft. Bowel sounds are normal. He exhibits no mass. There is no tenderness. There is no rebound and no guarding.  Abdominal obesity without liver or spleen enlargement masses or bruits  Musculoskeletal: Normal range of motion. He exhibits no edema.  The right elbow that he has some trouble with remains good and he has not had problems with his tennis elbow because of the new brace that his wife purchased for him that he is using at work.  Lymphadenopathy:    He has no cervical adenopathy.  Neurological: He is alert and oriented to person, place, and time. He has normal reflexes. No cranial nerve deficit.  Skin: Skin is warm and dry. No rash noted.  Psychiatric: He has a normal mood and affect. His behavior is normal. Judgment and thought content normal.  Nursing note and vitals reviewed.  BP 128/72 (BP Location: Left Arm)   Pulse 72   Temp 97.7 F (36.5 C) (Oral)   Ht _0  (1.702 m)   Wt 238 lb (108 kg)   BMI 37.28 kg/m   Chest x-ray with results pending                                      Assessment & Plan:  1. Other hyperlipidemia -Continue with aggressive therapeutic lifestyle changes and current treatment pending results of lab work - CBC with Differential/Platelet; Future - Lipid panel; Future - DG Chest 2 View; Future  2. Gastroesophageal reflux disease, esophagitis presence not specified -Continue to watch diet  closely lose weight and use omeprazole as doing - CBC with Differential/Platelet; Future  3. Essential hypertension -Continue with current treatment and make all efforts at losing even more weight. - BMP8+EGFR; Future - CBC with Differential/Platelet; Future - Hepatic function panel; Future - DG Chest 2 View; Future  4. Vitamin D deficiency -Continue with current treatment pending results of lab work - CBC with Differential/Platelet; Future - VITAMIN D 25 Hydroxy (Vit-D Deficiency, Fractures); Future  5. Lateral epicondylitis of right elbow -Continue wearing brace as this must have helped considerably with recurring epicondylitis.  Patient Instructions  Continue current medications. Continue good therapeutic lifestyle changes which include good diet and exercise. Fall precautions discussed with patient. If an FOBT was given today- please return it to our front desk. If you are over 37 years old - you  may need Prevnar 13 or the adult Pneumonia vaccine.  **Flu shots are available--- please call and schedule a FLU-CLINIC appointment**  After your visit with [place] today you will receive a survey in the mail or online from [employer] regarding your care with [place]. Please take a moment to fill this out. Your feedback is very important to [place] as you can help [place] better understand your patient needs as well as improve your experience and satisfaction. WE CARE ABOUT YOU!!!   Continue to get your flu shot Dr. Elmo Putt will do your next colonoscopy in 2021 in the spring that will be 5 years after the one you had in the spring of 2016. Follow-up with pulmonologist for sleep apnea as needed Follow-up with ear nose and throat and make sure that we get a note from them at your next visit    Arrie Senate MD

## 2016-08-04 ENCOUNTER — Telehealth: Payer: Self-pay | Admitting: Internal Medicine

## 2016-08-04 NOTE — Telephone Encounter (Signed)
Patty it looks like you have been speaking with this pt.

## 2016-08-05 ENCOUNTER — Other Ambulatory Visit: Payer: Self-pay

## 2016-08-05 DIAGNOSIS — R933 Abnormal findings on diagnostic imaging of other parts of digestive tract: Secondary | ICD-10-CM

## 2016-08-05 NOTE — Telephone Encounter (Signed)
Pt's wife will discuss days available and will call back to schedule

## 2016-08-05 NOTE — Telephone Encounter (Signed)
Left message on machine to call back  

## 2016-08-05 NOTE — Telephone Encounter (Signed)
Barnetta Chapel from [facility] returned phone call. Best #  [phone]

## 2016-08-05 NOTE — Telephone Encounter (Signed)
EUS scheduled, pt instructed and medications reviewed.  Patient instructions mailed to home.  Patient to call with any questions or concerns.  

## 2016-08-05 NOTE — Telephone Encounter (Signed)
Returned pt call and she was at lunch, message to have her call when available.

## 2016-08-11 ENCOUNTER — Other Ambulatory Visit: Payer: Self-pay | Admitting: Nurse Practitioner

## 2016-09-16 ENCOUNTER — Other Ambulatory Visit: Payer: Self-pay | Admitting: *Deleted

## 2016-09-16 ENCOUNTER — Encounter (HOSPITAL_COMMUNITY): Payer: Self-pay | Admitting: *Deleted

## 2016-09-16 MED ORDER — LOSARTAN POTASSIUM 50 MG PO TABS: 50.0000 mg | ORAL_TABLET | Freq: Every day | ORAL | 3 refills | Status: DC

## 2016-09-16 MED ORDER — OMEPRAZOLE 40 MG PO CPDR: 40.0000 mg | DELAYED_RELEASE_CAPSULE | Freq: Every day | ORAL | 3 refills | Status: DC

## 2016-09-16 MED ORDER — AMLODIPINE BESYLATE 5 MG PO TABS: ORAL_TABLET | ORAL | 3 refills | Status: DC

## 2016-09-25 ENCOUNTER — Ambulatory Visit (HOSPITAL_COMMUNITY): Payer: 59 | Admitting: Anesthesiology

## 2016-09-25 ENCOUNTER — Encounter (HOSPITAL_COMMUNITY)
Admission: RE | Disposition: A | Discharge status: Home / Self Care | Payer: Self-pay | Source: Ambulatory Visit | Attending: Gastroenterology

## 2016-09-25 ENCOUNTER — Encounter (HOSPITAL_COMMUNITY): Payer: Self-pay

## 2016-09-25 ENCOUNTER — Ambulatory Visit (HOSPITAL_COMMUNITY)
Admission: RE | Admit: 2016-09-25 | Discharge: 2016-09-25 | Disposition: A | Discharge status: Home / Self Care | Payer: 59 | Source: Ambulatory Visit | Attending: Gastroenterology | Admitting: Gastroenterology

## 2016-09-25 DIAGNOSIS — G4733 Obstructive sleep apnea (adult) (pediatric): Secondary | ICD-10-CM | POA: Insufficient documentation

## 2016-09-25 DIAGNOSIS — K219 Gastro-esophageal reflux disease without esophagitis: Secondary | ICD-10-CM | POA: Diagnosis not present

## 2016-09-25 DIAGNOSIS — R7303 Prediabetes: Secondary | ICD-10-CM | POA: Insufficient documentation

## 2016-09-25 DIAGNOSIS — K3189 Other diseases of stomach and duodenum: Secondary | ICD-10-CM

## 2016-09-25 DIAGNOSIS — E785 Hyperlipidemia, unspecified: Secondary | ICD-10-CM | POA: Diagnosis not present

## 2016-09-25 DIAGNOSIS — I10 Essential (primary) hypertension: Secondary | ICD-10-CM | POA: Diagnosis not present

## 2016-09-25 DIAGNOSIS — D490 Neoplasm of unspecified behavior of digestive system: Secondary | ICD-10-CM | POA: Diagnosis not present

## 2016-09-25 DIAGNOSIS — Z8371 Family history of colonic polyps: Secondary | ICD-10-CM | POA: Diagnosis not present

## 2016-09-25 DIAGNOSIS — R933 Abnormal findings on diagnostic imaging of other parts of digestive tract: Secondary | ICD-10-CM

## 2016-09-25 HISTORY — PX: EUS: SHX5427

## 2016-09-25 HISTORY — DX: Prediabetes: R73.03

## 2016-09-25 LAB — GLUCOSE, CAPILLARY: Glucose-Capillary: 132 mg/dL — ABNORMAL HIGH (ref 65–99)

## 2016-09-25 SURGERY — UPPER ENDOSCOPIC ULTRASOUND (EUS) RADIAL
Anesthesia: Monitor Anesthesia Care

## 2016-09-25 MED ORDER — PROPOFOL 500 MG/50ML IV EMUL
INTRAVENOUS | Status: DC | PRN
  Administered 2016-09-25: 75 ug/kg/min via INTRAVENOUS

## 2016-09-25 MED ORDER — PROPOFOL 10 MG/ML IV BOLUS
INTRAVENOUS | Status: AC
  Filled 2016-09-25: qty 40

## 2016-09-25 MED ORDER — SODIUM CHLORIDE 0.9 % IV SOLN
INTRAVENOUS | Status: DC
Start: 2016-09-25 — End: 2016-09-25

## 2016-09-25 MED ORDER — PROPOFOL 10 MG/ML IV BOLUS
INTRAVENOUS | Status: DC | PRN
  Administered 2016-09-25: 40 mg via INTRAVENOUS
  Administered 2016-09-25 (×2): 20 mg via INTRAVENOUS
  Administered 2016-09-25: 40 mg via INTRAVENOUS
  Administered 2016-09-25: 20 mg via INTRAVENOUS

## 2016-09-25 MED ORDER — KETAMINE HCL 10 MG/ML IJ SOLN
INTRAMUSCULAR | Status: DC | PRN
  Administered 2016-09-25 (×2): 10 mg via INTRAVENOUS

## 2016-09-25 MED ORDER — LACTATED RINGERS IV SOLN
INTRAVENOUS | Status: DC
  Administered 2016-09-25: 07:00:00 via INTRAVENOUS

## 2016-09-25 MED ORDER — KETAMINE HCL 10 MG/ML IJ SOLN
INTRAMUSCULAR | Status: AC
  Filled 2016-09-25: qty 1

## 2016-09-25 NOTE — Anesthesia Preprocedure Evaluation (Signed)
Anesthesia Evaluation  Patient identified by MRN, date of birth, ID band Patient awake    Reviewed: Allergy & Precautions, NPO status , Patient's Chart, lab work & pertinent test results  Airway Mallampati: II  TM Distance: >3 FB     Dental   Pulmonary    breath sounds clear to auscultation       Cardiovascular hypertension,  Rhythm:Regular Rate:Normal     Neuro/Psych    GI/Hepatic Neg liver ROS, GERD  ,  Endo/Other  negative endocrine ROS  Renal/GU negative Renal ROS     Musculoskeletal   Abdominal   Peds  Hematology   Anesthesia Other Findings   Reproductive/Obstetrics                             Anesthesia Physical Anesthesia Plan  ASA: III  Anesthesia Plan: MAC   Post-op Pain Management:    Induction: Intravenous  Airway Management Planned: Simple Face Mask  Additional Equipment:   Intra-op Plan:   Post-operative Plan:   Informed Consent: I have reviewed the patients History and Physical, chart, labs and discussed the procedure including the risks, benefits and alternatives for the proposed anesthesia with the patient or authorized representative who has indicated his/her understanding and acceptance.   Dental advisory given  Plan Discussed with:   Anesthesia Plan Comments:         Anesthesia Quick Evaluation

## 2016-09-25 NOTE — Op Note (Signed)
[facility] Patient Name: Jimmy Perez Procedure Date: 09/25/2016 MRN: [id number] Attending MD: [name] , MD Date of Birth: 06/11/1966 CSN: [id number] Age: 50 Admit Type: Outpatient Procedure:                Upper EUS Indications:              Gastric mucosal mass/polyp found on endoscopy;                            Gastric subepithelial lesion noted Dr. Hilarie Fredrickson                            12/2014; EUS Dr. 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 Estimated blood loss:                            None. Estimated Blood Loss:     Estimated blood loss: none. Procedure:                Pre-Anesthesia Assessment:                           - Prior to the procedure, a History and Physical                            was performed, and patient medications and                            allergies were reviewed. The patient's tolerance of                            previous anesthesia was also reviewed. The risks                            and benefits of the procedure and the sedation                            options and risks were discussed with the patient.                            All questions were answered, and informed consent  was obtained. Prior Anticoagulants: The patient has                            taken no previous anticoagulant or antiplatelet                            agents. ASA Grade Assessment: III - A patient with                            severe systemic disease. After reviewing the risks                            and benefits, the patient was deemed in                            satisfactory condition to undergo  the procedure.                           After obtaining informed consent, the endoscope was                            passed under direct vision. Throughout the                            procedure, the patient's blood pressure, pulse, and                            oxygen saturations were monitored continuously. The                            [id number]) scope was introduced through                            the mouth, and advanced to the second part of                            duodenum. The upper EUS was accomplished without                            difficulty. The patient tolerated the procedure                            well. Scope In: Scope Out: Findings:      Endoscopic Finding :      1. There was a small, submucosal mass with no bleeding was found in the       gastric antrum. This was approximately 1cm across, located just proximal       to the pylorus.      2. UGI tract was otherwise normal.      Endosonographic Finding :      1. The submucosal lesion noted above correlated with a round intramural       (subepithelial) lesion was found in the antrum of the stomach. The       lesion was hypoechoic. Sonographically, the lesion appeared to originate  from the muscularis propria (Layer 4). The lesion measured 8 mm (in       maximum thickness). The outer endosonographic borders were well defined.      2. No perigastric adenopathy.      3. Limited views of pancreas, spleen, portal and splenic vessels, liver       were all normal. Impression:               - The pre-pyloric submucosal gastric lesion noted                            originally in 2016 is slightly smaller by EUS                            today. 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                           - No specimens collected. Moderate Sedation:      N/A- Per Anesthesia Care Recommendation:           -  Discharge patient to home (ambulatory).                           - Repeat EUS in 2 years. Procedure Code(s):        --- Professional ---                           [phone], Esophagogastroduodenoscopy, flexible,                            transoral; with endoscopic ultrasound examination                            limited to the esophagus, stomach or duodenum, and                            adjacent structures Diagnosis Code(s):        --- Professional ---                           D49.0, Neoplasm of unspecified behavior of                            digestive system                           K31.89, Other diseases of stomach and duodenum CPT copyright 2016 American Medical Association. All rights reserved. The codes documented in this report are preliminary and upon coder review may  be revised to meet current compliance requirements. [name], MD 09/25/2016 8:49:25 AM This report has been signed electronically. Number of Addenda: 0

## 2016-09-25 NOTE — Anesthesia Postprocedure Evaluation (Signed)
Anesthesia Post Note  Patient: Jimmy Perez.  Procedure(s) Performed: Procedure(s) (LRB): UPPER ENDOSCOPIC ULTRASOUND (EUS) RADIAL (N/A)  Patient location during evaluation: Endoscopy Anesthesia Type: Spinal Level of consciousness: awake Pain management: pain level controlled Vital Signs Assessment: post-procedure vital signs reviewed and stable Respiratory status: spontaneous breathing Cardiovascular status: stable Anesthetic complications: no    Last Vitals:  Vitals:   09/25/16 0850 09/25/16 0900  BP: (!) 152/98 (!) 175/89  Pulse: 72 62  Resp: (!) 25 16  Temp:      Last Pain:  Vitals:   09/25/16 0844  TempSrc: Oral                 EDWARDS,Lagina Reader

## 2016-09-25 NOTE — Transfer of Care (Signed)
Immediate Anesthesia Transfer of Care Note  Patient: Jimmy Perez.  Procedure(s) Performed: Procedure(s): UPPER ENDOSCOPIC ULTRASOUND (EUS) RADIAL (N/A)  Patient Location: PACU  Anesthesia Type:MAC  Level of Consciousness:  sedated, patient cooperative and responds to stimulation  Airway & Oxygen Therapy:Patient Spontanous Breathing and Patient connected to face mask oxgen  Post-op Assessment:  Report given to PACU RN and Post -op Vital signs reviewed and stable  Post vital signs:  Reviewed and stable  Last Vitals:  Vitals:   09/25/16 0712 09/25/16 0844  BP: (!) 185/105   Pulse: 62 62  Resp: (!) 8 15  Temp: 123XX123 C     Complications: No apparent anesthesia complications

## 2016-09-25 NOTE — Discharge Instructions (Signed)

## 2016-09-25 NOTE — H&P (Signed)
  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  Here for interval surveillance  ROS: complete GI ROS as described in HPI.  Constitutional:  No unintentional weight loss   Past Medical History:  Diagnosis Date  . Borderline diabetes    no meds, watching diet.  Marland Kitchen GERD (gastroesophageal reflux disease)   . Hyperlipidemia   . Hypertension   . OSA (obstructive sleep apnea)    Cpap use--study 12-26-14 Epic suggestive of this.  . Pre-diabetes    "per patient"  . Vitamin D deficiency     Past Surgical History:  Procedure Laterality Date  . EUS N/A 02/08/2015   Procedure: UPPER ENDOSCOPIC ULTRASOUND (EUS) LINEAR;  Surgeon: [name], MD;  Location: [facility];  Service: Endoscopy;  Laterality: N/A;  . KNEE ARTHROSCOPY Right 11/28/95   [name])    Current Facility-Administered Medications  Medication Dose Route Frequency Provider Last Rate Last Dose  . 0.9 %  sodium chloride infusion   Intravenous Continuous [name], MD      . lactated ringers infusion   Intravenous Continuous [name], MD        Allergies as of 08/05/2016  . (No Known Allergies)    Family History  Problem Relation Age of Onset  . Diabetes Mother   . Hyperlipidemia Father   . Heart disease Father   . Hypertension Father   . Diabetes Father   . Diabetes Sister   . Hypertension Sister   . Hyperlipidemia Brother   . Hypertension Brother   . Colon polyps Brother   . Colon cancer Neg Hx   . Stomach cancer Neg Hx     Social History   Social History  . Marital status: Married    Spouse name: Tye [place]   . Number of children: 2  . Years of education: N/A   Occupational History  . Financial trader    Social History Main Topics  . Smoking status: Never Smoker  . Smokeless tobacco: Current User    Types: Snuff     Comment: off an on (pouches) of snuff  . Alcohol use 3.6 oz/week    6 Cans of beer per week     Comment: occasional- depending on the occasion  . Drug use: No  . Sexual activity: Not on file   Other Topics Concern  . Not on file   Social History Narrative  . No narrative on file     Physical Exam: BP (!) 185/105   Pulse 62   Temp 97.8 F (36.6 C) (Oral)   Resp (!) 8   Ht 5\' 7"  (1.702 m)   Wt 238 lb (108 kg)   SpO2 97%   BMI 37.28 kg/m  Constitutional: generally well-appearing Psychiatric: alert and oriented x3 Abdomen: soft, nontender, nondistended, no obvious ascites, no peritoneal signs, normal bowel sounds No peripheral edema noted in lower extremities  Assessment and plan: 50 y.o. male with gastric mass  For surveillance EUS today   Owens Loffler, MD [facility] 09/25/2016, 8:06 AM

## 2016-09-26 ENCOUNTER — Encounter (HOSPITAL_COMMUNITY): Payer: Self-pay | Admitting: Gastroenterology

## 2016-09-27 ENCOUNTER — Other Ambulatory Visit: Payer: 59

## 2016-09-27 ENCOUNTER — Other Ambulatory Visit: Payer: Self-pay | Admitting: Family Medicine

## 2016-09-27 DIAGNOSIS — I10 Essential (primary) hypertension: Secondary | ICD-10-CM

## 2016-09-27 DIAGNOSIS — E7849 Other hyperlipidemia: Secondary | ICD-10-CM

## 2016-09-27 DIAGNOSIS — E8881 Metabolic syndrome: Secondary | ICD-10-CM

## 2016-09-27 DIAGNOSIS — E669 Obesity, unspecified: Secondary | ICD-10-CM

## 2016-09-29 LAB — BMP8+EGFR
BUN / CREAT RATIO: 15 (ref 9–20)
BUN: 15 mg/dL (ref 6–24)
CHLORIDE: 99 mmol/L (ref 96–106)
CO2: 24 mmol/L (ref 18–29)
Calcium: 10.1 mg/dL (ref 8.7–10.2)
Creatinine, Ser: 1.03 mg/dL (ref 0.76–1.27)
GFR calc Af Amer: 98 mL/min/{1.73_m2} (ref >59)
GFR calc non Af Amer: 85 mL/min/{1.73_m2} (ref >59)
GLUCOSE: 113 mg/dL — AB (ref 65–99)
Potassium: 5 mmol/L (ref 3.5–5.2)
Sodium: 142 mmol/L (ref 134–144)

## 2016-09-29 LAB — LIPID PANEL
CHOLESTEROL TOTAL: 190 mg/dL (ref 100–199)
Chol/HDL Ratio: 5.3 ratio units — ABNORMAL HIGH (ref 0.0–5.0)
HDL: 36 mg/dL — AB (ref >39)
LDL Calculated: 97 mg/dL (ref 0–99)
TRIGLYCERIDES: 285 mg/dL — AB (ref 0–149)
VLDL Cholesterol Cal: 57 mg/dL — ABNORMAL HIGH (ref 5–40)

## 2016-09-29 LAB — HEPATIC FUNCTION PANEL
ALT: 34 IU/L (ref 0–44)
AST: 14 IU/L (ref 0–40)
Albumin: 4.7 g/dL (ref 3.5–5.5)
Alkaline Phosphatase: 85 IU/L (ref 39–117)
BILIRUBIN, DIRECT: 0.17 mg/dL (ref 0.00–0.40)
Bilirubin Total: 0.7 mg/dL (ref 0.0–1.2)
Total Protein: 7.2 g/dL (ref 6.0–8.5)

## 2016-09-29 LAB — CBC WITH DIFFERENTIAL/PLATELET
BASOS ABS: 0 10*3/uL (ref 0.0–0.2)
Basos: 0 %
EOS (ABSOLUTE): 0.2 10*3/uL (ref 0.0–0.4)
Eos: 3 %
HEMOGLOBIN: 15 g/dL (ref 12.6–17.7)
Hematocrit: 43.8 % (ref 37.5–51.0)
Immature Grans (Abs): 0 10*3/uL (ref 0.0–0.1)
Immature Granulocytes: 1 %
LYMPHS ABS: 2.3 10*3/uL (ref 0.7–3.1)
Lymphs: 31 %
MCH: 32.1 pg (ref 26.6–33.0)
MCHC: 34.2 g/dL (ref 31.5–35.7)
MCV: 94 fL (ref 79–97)
MONOCYTES: 9 %
MONOS ABS: 0.7 10*3/uL (ref 0.1–0.9)
Neutrophils Absolute: 4.2 10*3/uL (ref 1.4–7.0)
Neutrophils: 56 %
Platelets: 318 10*3/uL (ref 150–379)
RBC: 4.68 x10E6/uL (ref 4.14–5.80)
RDW: 13 % (ref 12.3–15.4)
WBC: 7.4 10*3/uL (ref 3.4–10.8)

## 2016-09-29 LAB — VITAMIN D 25 HYDROXY (VIT D DEFICIENCY, FRACTURES): VIT D 25 HYDROXY: 22.7 ng/mL — AB (ref 30.0–100.0)

## 2016-09-29 LAB — BAYER DCA HB A1C WAIVED: HB A1C: 6 % (ref <7.0)

## 2016-10-10 ENCOUNTER — Other Ambulatory Visit: Payer: Self-pay | Admitting: *Deleted

## 2016-10-10 MED ORDER — VITAMIN D (ERGOCALCIFEROL) 1.25 MG (50000 UNIT) PO CAPS: 50000.0000 [IU] | ORAL_CAPSULE | ORAL | 1 refills | Status: DC

## 2016-11-18 ENCOUNTER — Ambulatory Visit: Payer: 59 | Admitting: Internal Medicine

## 2016-12-01 ENCOUNTER — Ambulatory Visit: Payer: 59 | Admitting: Internal Medicine

## 2017-01-10 ENCOUNTER — Encounter: Payer: Self-pay | Admitting: Nurse Practitioner

## 2017-01-10 ENCOUNTER — Other Ambulatory Visit: Payer: Self-pay | Admitting: Nurse Practitioner

## 2017-01-10 ENCOUNTER — Ambulatory Visit (INDEPENDENT_AMBULATORY_CARE_PROVIDER_SITE_OTHER): Payer: 59 | Admitting: Nurse Practitioner

## 2017-01-10 VITALS — BP 139/87 | HR 57 | Temp 96.8°F | Ht 68.0 in | Wt 240.0 lb

## 2017-01-10 DIAGNOSIS — M79674 Pain in right toe(s): Secondary | ICD-10-CM

## 2017-01-10 MED ORDER — PREDNISONE 20 MG PO TABS: ORAL_TABLET | ORAL | 0 refills | Status: DC

## 2017-01-10 MED ORDER — COLCHICINE 0.6 MG PO TABS: 0.6000 mg | ORAL_TABLET | Freq: Every day | ORAL | 1 refills | Status: DC

## 2017-01-10 NOTE — Progress Notes (Signed)
   Subjective:    Patient ID: Jimmy Loretha Stapler., male    DOB: 02/09/1966, 51 y.o.   MRN: [id number]  HPI Patient comes in today escorted by his wife. He is c/o left foot pain- started all of the sudden yesterday afternoon. Denies any type of injury.    Review of Systems  Constitutional: Negative.   HENT: Negative.   Respiratory: Negative.   Cardiovascular: Negative.   Genitourinary: Negative.   Neurological: Negative.   Psychiatric/Behavioral: Negative.   All other systems reviewed and are negative.      Objective:   Physical Exam  Constitutional: He appears well-developed and well-nourished. No distress.  Cardiovascular: Normal rate, regular rhythm and normal heart sounds.   Pulmonary/Chest: Effort normal.  Musculoskeletal:  Pain on medial side of left great toe- mild erythema and edema. Pain with movement  Neurological: He is alert.  Skin: Skin is warm.  Psychiatric: He has a normal mood and affect. His behavior is normal. Judgment and thought content normal.   BP 139/87   Pulse (!) 57   Temp (!) 96.8 F (36 C) (Oral)   Ht 5\' 8"  (1.727 m)   Wt 240 lb (108.9 kg)   BMI 36.49 kg/m         Assessment & Plan:  1. Great toe pain, right probable gout Ice BID Rest  elevate Labs pending  Meds ordered this encounter  Medications  . colchicine 0.6 MG tablet    Sig: Take 1 tablet (0.6 mg total) by mouth daily. May repeat in 2 hours x1 in 24hours    Dispense:  20 tablet    Refill:  1    Order Specific Question:   Supervising Provider    Answer:   Eustaquio Maize Portis, FNP

## 2017-01-10 NOTE — Addendum Note (Signed)
Addended by: Chevis Pretty on: 01/10/2017 09:56 AM   Modules accepted: Orders

## 2017-01-10 NOTE — Patient Instructions (Signed)
Gout Gout is painful swelling that can happen in some of your joints. Gout is a type of arthritis. This condition is caused by having too much uric acid in your body. Uric acid is a chemical that is made when your body breaks down substances called purines. If your body has too much uric acid, sharp crystals can form and build up in your joints. This causes pain and swelling. Gout attacks can happen quickly and be very painful (acute gout). Over time, the attacks can affect more joints and happen more often (chronic gout). Follow these instructions at home: During a Gout Attack   If directed, put ice on the painful area:  Put ice in a plastic bag.  Place a towel between your skin and the bag.  Leave the ice on for 20 minutes, 2-3 times a day.  Rest the joint as much as possible. If the joint is in your leg, you may be given crutches to use.  Raise (elevate) the painful joint above the level of your heart as often as you can.  Drink enough fluids to keep your pee (urine) clear or pale yellow.  Take over-the-counter and prescription medicines only as told by your doctor.  Do not drive or use heavy machinery while taking prescription pain medicine.  Follow instructions from your doctor about what you can or cannot eat and drink.  Return to your normal activities as told by your doctor. Ask your doctor what activities are safe for you. Avoiding Future Gout Attacks   Follow a low-purine diet as told by a specialist (dietitian) or your doctor. Avoid foods and drinks that have a lot of purines, such as:  Liver.  Kidney.  Anchovies.  Asparagus.  Herring.  Mushrooms  Mussels.  Beer.  Limit alcohol intake to no more than 1 drink a day for nonpregnant women and 2 drinks a day for men. One drink equals 12 oz of beer, 5 oz of wine, or 1 oz of hard liquor.  Stay at a healthy weight or lose weight if you are overweight. If you want to lose weight, talk with your doctor. It is  important that you do not lose weight too fast.  Start or continue an exercise plan as told by your doctor.  Drink enough fluids to keep your pee clear or pale yellow.  Take over-the-counter and prescription medicines only as told by your doctor.  Keep all follow-up visits as told by your doctor. This is important. Contact a doctor if:  You have another gout attack.  You still have symptoms of a gout attack after10 days of treatment.  You have problems (side effects) because of your medicines.  You have chills or a fever.  You have burning pain when you pee (urinate).  You have pain in your lower back or belly. Get help right away if:  You have very bad pain.  Your pain cannot be controlled.  You cannot pee. This information is not intended to replace advice given to you by your health care provider. Make sure you discuss any questions you have with your health care provider. Document Released: 07/22/2008 Document Revised: 03/20/2016 Document Reviewed: 07/26/2015 Elsevier Interactive Patient Education  2017 [employer]vier Inc.  

## 2017-01-11 LAB — ARTHRITIS PANEL
BASOS ABS: 0 10*3/uL (ref 0.0–0.2)
Basos: 0 %
EOS (ABSOLUTE): 0.3 10*3/uL (ref 0.0–0.4)
EOS: 3 %
HEMATOCRIT: 44.3 % (ref 37.5–51.0)
Hemoglobin: 15.4 g/dL (ref 13.0–17.7)
IMMATURE GRANS (ABS): 0 10*3/uL (ref 0.0–0.1)
Immature Granulocytes: 0 %
LYMPHS ABS: 2.1 10*3/uL (ref 0.7–3.1)
LYMPHS: 24 %
MCH: 32.6 pg (ref 26.6–33.0)
MCHC: 34.8 g/dL (ref 31.5–35.7)
MCV: 94 fL (ref 79–97)
MONOCYTES: 9 %
Monocytes Absolute: 0.8 10*3/uL (ref 0.1–0.9)
NEUTROS ABS: 5.7 10*3/uL (ref 1.4–7.0)
Neutrophils: 64 %
PLATELETS: 290 10*3/uL (ref 150–379)
RBC: 4.73 x10E6/uL (ref 4.14–5.80)
RDW: 13.5 % (ref 12.3–15.4)
Rhuematoid fact SerPl-aCnc: <10 IU/mL (ref 0.0–13.9)
SED RATE: 2 mm/h (ref 0–30)
Uric Acid: 8.6 mg/dL (ref 3.7–8.6)
WBC: 9 10*3/uL (ref 3.4–10.8)

## 2017-01-19 ENCOUNTER — Encounter: Payer: Self-pay | Admitting: Internal Medicine

## 2017-01-19 ENCOUNTER — Ambulatory Visit (INDEPENDENT_AMBULATORY_CARE_PROVIDER_SITE_OTHER): Payer: 59 | Admitting: Internal Medicine

## 2017-01-19 VITALS — BP 122/76 | HR 82 | Ht 67.0 in | Wt 246.6 lb

## 2017-01-19 DIAGNOSIS — E6609 Other obesity due to excess calories: Secondary | ICD-10-CM

## 2017-01-19 DIAGNOSIS — G4733 Obstructive sleep apnea (adult) (pediatric): Secondary | ICD-10-CM | POA: Diagnosis not present

## 2017-01-19 NOTE — Progress Notes (Signed)
11/06/14- 30 yoM nonsmoker referred for sleep evaluation courtesy of Dr Wonda Horner sleep study. Snoring, stops breathing through the night (per wife) and alot of moving around. Wife here. Snoring has been worse for the last year or so as he has gained weight. Usual bedtime around 10:30 PM, sleep latency 10 minutes, occasionally wakes during the night before up around 6:30. No ENT surgery, heart or lung disease. Treated for high blood pressure. Little caffeine, no sleep med. [place] 11/24  05/17/15- 41 yoM nonsmoker referred for sleep evaluation courtesy of Dr Wonda Horner sleep study. Snoring, stops breathing through the night (per wife) and alot of moving around. Wife here. FOLLOWS FOR:DME-[facility], getting use to wearing cpap auto 5-20/[facility] it's better Unattended home sleep study 01/05/15 AHI 77/ hr, desat to 73%, weight 253 lbs Using a nasal pillows mask. Sometimes auto titration pressure gets too high  11/19/2015-51 year old male nonsmoker followed for OSA complicated by HBP, GERD, obesity CPAP auto 8-15  FOLLOWS FOR: Pt states he is wearing CPAP every night for about 6-7 hours. DME: [facility]. No new supplies needed at this time.  Download shows adequate compliance excellent control. He missed a few nights when traveling. Sleeps better and feels better with CPAP.  01/19/2017-51 year old male nonsmoker followed for OSA, complicated by HBP, GERD, obesity CPAP auto 8-15/[facility] FOLLOWS FOR:DME:[facility] Pt continues to wear CPAP nightly and DL attached. Rx new supplies needed at this time.  Download 73%/4 hours, AHI 1.2/hour Feels very comfortable with CPAP and with this pressure range. Has not bothered to take it with him a couple of times going out of town for short trips.  ROS-see HPI Constitutional:   No-   weight loss, night sweats, fevers, chills, +fatigue, lassitude. HEENT:   No-  headaches, difficulty swallowing, tooth/dental problems, sore throat,       No-  sneezing, itching, ear ache, nasal  congestion, post nasal drip,  CV:  No-   chest pain, orthopnea, PND, swelling in lower extremities, anasarca,                                                    dizziness, palpitations Resp: No-   shortness of breath with exertion or at rest.              No-   productive cough,  No non-productive cough,  No- coughing up of blood.              No-   change in color of mucus.  No- wheezing.   Skin: No-   rash or lesions. GI:  +heartburn, indigestion, No-abdominal pain, nausea, vomiting,  GU:  MS:  No-   joint pain or swelling.   Neuro-     nothing unusual Psych:  No- change in mood or affect. No depression or anxiety.  No memory loss.  OBJ- Physical Exam     +obese General- Alert, Oriented, Affect-appropriate, Distress- none acute, + overweight Skin- rash-none, lesions- none, excoriation- none Lymphadenopathy- none Head- atraumatic            Eyes- Gross vision intact, PERRLA, conjunctivae and secretions clear            Ears- Hearing, canals-normal            Nose- Clear, no-Septal dev, mucus, polyps, erosion, perforation  Throat- Mallampati IV , mucosa clear , drainage- none, tonsils- atrophic, + hoarse Neck- flexible , trachea midline, no stridor , thyroid nl, carotid no bruit Chest - symmetrical excursion , unlabored           Heart/CV- RRR , no murmur , no gallop  , no rub, nl s1 s2                           - JVD- none , edema- none, stasis changes- none, varices- none           Lung- clear to P&A, wheeze- none, cough- none , dullness-none, rub- none           Chest wall-  Abd-  Br/ Gen/ Rectal- Not done, not indicated Extrem- cyanosis- none, clubbing, none, atrophy- none, strength- nl Neuro- grossly intact to observation

## 2017-01-19 NOTE — Patient Instructions (Addendum)
Order- DME [facility]- continue CPAP auto 8-15, mask of choice, humidifier, supplies, AirView  Dx OSA                            Needs new supplies now please  Please call as needed

## 2017-01-27 ENCOUNTER — Other Ambulatory Visit: Payer: 59

## 2017-01-27 DIAGNOSIS — I10 Essential (primary) hypertension: Secondary | ICD-10-CM

## 2017-01-27 DIAGNOSIS — N400 Enlarged prostate without lower urinary tract symptoms: Secondary | ICD-10-CM

## 2017-01-27 DIAGNOSIS — E559 Vitamin D deficiency, unspecified: Secondary | ICD-10-CM

## 2017-01-27 DIAGNOSIS — K219 Gastro-esophageal reflux disease without esophagitis: Secondary | ICD-10-CM

## 2017-01-27 DIAGNOSIS — E7849 Other hyperlipidemia: Secondary | ICD-10-CM

## 2017-01-27 DIAGNOSIS — Z0000 Encounter for general adult medical examination without abnormal findings: Secondary | ICD-10-CM

## 2017-01-28 ENCOUNTER — Other Ambulatory Visit: Payer: Self-pay | Admitting: Family Medicine

## 2017-01-28 LAB — HEPATIC FUNCTION PANEL
ALBUMIN: 4.5 g/dL (ref 3.5–5.5)
ALK PHOS: 68 IU/L (ref 39–117)
ALT: 23 IU/L (ref 0–44)
AST: 13 IU/L (ref 0–40)
BILIRUBIN TOTAL: 0.4 mg/dL (ref 0.0–1.2)
BILIRUBIN, DIRECT: 0.11 mg/dL (ref 0.00–0.40)
Total Protein: 6.6 g/dL (ref 6.0–8.5)

## 2017-01-28 LAB — CBC WITH DIFFERENTIAL/PLATELET
BASOS: 0 %
Basophils Absolute: 0 10*3/uL (ref 0.0–0.2)
EOS (ABSOLUTE): 0.2 10*3/uL (ref 0.0–0.4)
EOS: 3 %
HEMATOCRIT: 39.9 % (ref 37.5–51.0)
Hemoglobin: 14 g/dL (ref 13.0–17.7)
IMMATURE GRANULOCYTES: 0 %
Immature Grans (Abs): 0 10*3/uL (ref 0.0–0.1)
Lymphocytes Absolute: 2.5 10*3/uL (ref 0.7–3.1)
Lymphs: 35 %
MCH: 33.3 pg — ABNORMAL HIGH (ref 26.6–33.0)
MCHC: 35.1 g/dL (ref 31.5–35.7)
MCV: 95 fL (ref 79–97)
MONOCYTES: 9 %
Monocytes Absolute: 0.7 10*3/uL (ref 0.1–0.9)
NEUTROS PCT: 53 %
Neutrophils Absolute: 3.8 10*3/uL (ref 1.4–7.0)
Platelets: 249 10*3/uL (ref 150–379)
RBC: 4.2 x10E6/uL (ref 4.14–5.80)
RDW: 13.4 % (ref 12.3–15.4)
WBC: 7.1 10*3/uL (ref 3.4–10.8)

## 2017-01-28 LAB — BMP8+EGFR
BUN / CREAT RATIO: 18 (ref 9–20)
BUN: 16 mg/dL (ref 6–24)
CO2: 23 mmol/L (ref 18–29)
CREATININE: 0.88 mg/dL (ref 0.76–1.27)
Calcium: 9.1 mg/dL (ref 8.7–10.2)
Chloride: 98 mmol/L (ref 96–106)
GFR calc Af Amer: 116 mL/min/{1.73_m2} (ref >59)
GFR, EST NON AFRICAN AMERICAN: 100 mL/min/{1.73_m2} (ref >59)
GLUCOSE: 149 mg/dL — AB (ref 65–99)
Potassium: 4.4 mmol/L (ref 3.5–5.2)
Sodium: 140 mmol/L (ref 134–144)

## 2017-01-28 LAB — PSA, TOTAL AND FREE
PSA FREE PCT: 35 %
PSA, Free: 0.14 ng/mL
Prostate Specific Ag, Serum: 0.4 ng/mL (ref 0.0–4.0)

## 2017-01-28 LAB — LIPID PANEL
CHOLESTEROL TOTAL: 178 mg/dL (ref 100–199)
Chol/HDL Ratio: 5.6 ratio — ABNORMAL HIGH (ref 0.0–5.0)
HDL: 32 mg/dL — AB (ref >39)
LDL Calculated: 67 mg/dL (ref 0–99)
Triglycerides: 394 mg/dL — ABNORMAL HIGH (ref 0–149)
VLDL Cholesterol Cal: 79 mg/dL — ABNORMAL HIGH (ref 5–40)

## 2017-01-28 LAB — VITAMIN D 25 HYDROXY (VIT D DEFICIENCY, FRACTURES): Vit D, 25-Hydroxy: 22.9 ng/mL — ABNORMAL LOW (ref 30.0–100.0)

## 2017-01-28 MED ORDER — VITAMIN D (ERGOCALCIFEROL) 1.25 MG (50000 UNIT) PO CAPS: 50000.0000 [IU] | ORAL_CAPSULE | ORAL | 1 refills | Status: DC

## 2017-01-30 ENCOUNTER — Ambulatory Visit (INDEPENDENT_AMBULATORY_CARE_PROVIDER_SITE_OTHER): Payer: 59 | Admitting: Family Medicine

## 2017-01-30 ENCOUNTER — Encounter: Payer: Self-pay | Admitting: Family Medicine

## 2017-01-30 VITALS — BP 132/83 | HR 72 | Temp 97.5°F | Ht 67.0 in | Wt 242.0 lb

## 2017-01-30 DIAGNOSIS — I10 Essential (primary) hypertension: Secondary | ICD-10-CM

## 2017-01-30 DIAGNOSIS — K219 Gastro-esophageal reflux disease without esophagitis: Secondary | ICD-10-CM

## 2017-01-30 DIAGNOSIS — E7849 Other hyperlipidemia: Secondary | ICD-10-CM

## 2017-01-30 DIAGNOSIS — Z0000 Encounter for general adult medical examination without abnormal findings: Secondary | ICD-10-CM | POA: Diagnosis not present

## 2017-01-30 DIAGNOSIS — N400 Enlarged prostate without lower urinary tract symptoms: Secondary | ICD-10-CM

## 2017-01-30 DIAGNOSIS — E559 Vitamin D deficiency, unspecified: Secondary | ICD-10-CM

## 2017-01-30 DIAGNOSIS — E1122 Type 2 diabetes mellitus with diabetic chronic kidney disease: Secondary | ICD-10-CM

## 2017-01-30 DIAGNOSIS — I129 Hypertensive chronic kidney disease with stage 1 through stage 4 chronic kidney disease, or unspecified chronic kidney disease: Secondary | ICD-10-CM

## 2017-01-30 DIAGNOSIS — N181 Chronic kidney disease, stage 1: Secondary | ICD-10-CM

## 2017-01-30 DIAGNOSIS — E785 Hyperlipidemia, unspecified: Secondary | ICD-10-CM

## 2017-01-30 DIAGNOSIS — E1169 Type 2 diabetes mellitus with other specified complication: Secondary | ICD-10-CM

## 2017-01-30 LAB — HGB A1C W/O EAG: HEMOGLOBIN A1C: 7.4 % — AB (ref 4.8–5.6)

## 2017-01-30 LAB — SPECIMEN STATUS REPORT

## 2017-01-30 MED ORDER — METFORMIN HCL 500 MG PO TABS: 500.0000 mg | ORAL_TABLET | Freq: Every day | ORAL | 3 refills | Status: DC

## 2017-01-30 MED ORDER — ROSUVASTATIN CALCIUM 5 MG PO TABS: 5.0000 mg | ORAL_TABLET | Freq: Every day | ORAL | 3 refills | Status: DC

## 2017-01-30 NOTE — Progress Notes (Signed)
Subjective:    Patient ID: Jimmy Perez., male    DOB: February 08, 1966, 51 y.o.   MRN: [id number]  HPI Patient is here today for annual wellness exam and follow up of chronic medical problems which includes hyperlipidemia and hypertension. He is taking medication regularly.The patient has had recent lab work and this will be reviewed with him during the visit today. His PSA was 0.4 and this is stable and consistent with readings from 1 year ago. Cholesterol numbers have an elevated triglyceride at 394. The patient's insurance would not pay for fenofibrate. Unfortunately he is only able to tolerate no more than 10 mg of atorvastatin 3 days a week. The HDL cholesterol remains low and can only be improved with better diet habits and more exercise. LDL C cholesterol was good and at goal at 67. The patient has been taking fish oil in the past and not sure if he is taking it regularly now. The blood sugar was elevated at 149 and the patient's hemoglobin A1c this time was 7.4%. The creatinine, the most important kidney function test was normal. The GFR was 100 and this is good. The electrolytes including potassium are good. The CBC has a normal white blood cell count with a good and stable hemoglobin at 14.0 and an adequate platelet count. All liver function tests were normal. The vitamin D level remains low at 22.9 and he will need to stay on vitamin D replacement 50,000 units once weekly. The patient says he had a recent gout attack but is better now this was involving his right foot. His eye exam was done a year ago this spring. His EKG was done today and this was normal. Patient denies any chest pain or shortness of breath. He denies any problems with his stomach including nausea vomiting diarrhea blood in the stool or black tarry bowel movements. His bowel habits have not changed. He did have a colonoscopy in March 2016 and polyps were found requiring him to have another colonoscopy next spring. He's passing  his water without problems. The patient's history is positive in both parents having diabetes and cancer on his father's side below her not sure what kind of cancer. There is also hypertension in one of his brothers.   Patient Active Problem List   Diagnosis Date Noted  . Abnormal CT scan, stomach   . Low back pain 12/06/2014  . Obstructive sleep apnea 11/06/2014  . Metabolic syndrome 31/49/7026  . HTN (hypertension) 04/18/2013  . Hyperlipidemia 04/18/2013  . GERD (gastroesophageal reflux disease) 04/18/2013  . Obesity 04/18/2013   Outpatient Encounter Prescriptions as of 01/30/2017  Medication Sig  . acetaminophen (TYLENOL) 500 MG tablet Take 500 mg by mouth every 6 (six) hours as needed for pain. Reported on 01/18/2016  . amLODipine (NORVASC) 5 MG tablet Take 1 tablet by mouth  daily  . atorvastatin (LIPITOR) 10 MG tablet Take 1 tablet (10 mg total) by mouth daily. (Patient taking differently: Take 10 mg by mouth 3 (three) times a week. Mon, Wed, Fri)  . colchicine 0.6 MG tablet Take 1 tablet (0.6 mg total) by mouth daily. May repeat in 2 hours x1 in 24hours  . cyclobenzaprine (FLEXERIL) 10 MG tablet Take 1 tablet (10 mg total) by mouth 3 (three) times daily as needed for muscle spasms.  . fluticasone (FLONASE) 50 MCG/ACT nasal spray Place 2 sprays into both nostrils daily.  Marland Kitchen losartan (COZAAR) 50 MG tablet Take 1 tablet (50 mg total) by mouth daily.  Marland Kitchen  naproxen (NAPROSYN) 500 MG tablet Take 1 tablet (500 mg total) by mouth 2 (two) times daily with a meal.  . Omega-3 Fatty Acids (FISH OIL) 1000 MG CAPS Take 1 capsule by mouth daily.  Marland Kitchen omeprazole (PRILOSEC) 40 MG capsule Take 1 capsule (40 mg total) by mouth daily.  . Vitamin D, Ergocalciferol, (DRISDOL) 50000 units CAPS capsule Take 1 capsule (50,000 Units total) by mouth every 7 (seven) days.   No facility-administered encounter medications on file as of 01/30/2017.      Review of Systems  Constitutional: Negative.   HENT: Negative.     Eyes: Negative.   Respiratory: Negative.   Cardiovascular: Negative.   Gastrointestinal: Negative.   Endocrine: Negative.   Genitourinary: Negative.   Musculoskeletal: Negative.   Skin: Negative.   Allergic/Immunologic: Negative.   Neurological: Negative.   Hematological: Negative.   Psychiatric/Behavioral: Negative.        Objective:   Physical Exam  Constitutional: He is oriented to person, place, and time. He appears well-developed and well-nourished.  Pleasant and alert and calm.  HENT:  Head: Normocephalic and atraumatic.  Right Ear: External ear normal.  Left Ear: External ear normal.  Nose: Nose normal.  Mouth/Throat: Oropharynx is clear and moist. No oropharyngeal exudate.  Eyes: Conjunctivae and EOM are normal. Pupils are equal, round, and reactive to light. Right eye exhibits no discharge. Left eye exhibits no discharge. No scleral icterus.  Neck: Normal range of motion. Neck supple. No thyromegaly present.  Cardiovascular: Normal rate, regular rhythm, normal heart sounds and intact distal pulses.   No murmur heard. Heart is regular at 72/m  Pulmonary/Chest: Effort normal and breath sounds normal. No respiratory distress. He has no wheezes. He has no rales. He exhibits no tenderness.  No axillary adenopathy Clear anteriorly and posteriorly  Abdominal: Soft. Bowel sounds are normal. He exhibits no mass. There is no tenderness. There is no rebound and no guarding.  Abdominal obesity without liver or spleen enlargement masses or bruits  Genitourinary: Rectum normal and penis normal.  Genitourinary Comments: Prostate is enlarged but soft and smooth without lumps or masses. There is no rectal masses. There were no hernias palpable in the external genitalia were within normal limits.  Musculoskeletal: Normal range of motion. He exhibits no edema.  Lymphadenopathy:    He has no cervical adenopathy.  Neurological: He is alert and oriented to person, place, and time. He has  normal reflexes. No cranial nerve deficit.  Skin: Skin is warm and dry. No rash noted.  Psychiatric: He has a normal mood and affect. His behavior is normal. Judgment and thought content normal.  Nursing note and vitals reviewed.  BP 132/83 (BP Location: Left Arm)   Pulse 72   Temp 97.5 F (36.4 C) (Oral)   Ht 5\' 7"  (1.702 m)   Wt 242 lb (109.8 kg)   BMI 37.90 kg/m         Assessment & Plan:  1. Annual physical exam -Patient is up-to-date on his colonoscopies but we'll need another one in 1 year because of previous colon polyps - Urinalysis, Complete - EKG 12-Lead  2. Essential hypertension -Blood pressure is good today and he will continue with current treatment - EKG 12-Lead  3. Other hyperlipidemia -The LDL C is good but he has very elevated triglycerides and cannot tolerate any more atorvastatin 10 mg 3 times a week. His insurance will not pay for fenofibrate. We're going to retry Crestor plus continue with Fish oil and get  him started on metformin hoping this will help bring his triglycerides down. He will also be dedicated himself to avoiding fast foods and getting more exercise. We will try Crestor 5 mg daily but he will start with one 3 times a week to make sure he tolerates this okay and then will increase this to 5 mg daily.  4. Vitamin D deficiency -The vitamin D level was low and he supposedly has been taking 50,000 units of vitamin D weekly and we'll ask him to continue with this.  5. Gastroesophageal reflux disease, esophagitis presence not specified -He is currently not having any problems with reflux. He will continue with the omeprazole since this is helping this.  6. Benign prostatic hyperplasia, unspecified whether lower urinary tract symptoms present -The prostate remains enlarged but the patient is asymptomatic with this. There were no lumps or masses. - Urinalysis, Complete  7. 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 He will be given a monitor today to start checking blood sugars at home and he will start metformin 500 mg once daily. He will do better with his diet and eating habits. He will come back in the blood sugars to visit with the clinical pharmacist in 4 weeks and bring blood sugars to that visit and at that time we'll make a decision about it more treatment is necessary.  8. Hyperlipidemia associated with type 2 diabetes mellitus ([place]) -Discontinued atorvastatin and try Crestor 5 mg daily starting with 1 tablet 3 times weekly and then increasing to 1 daily. -He will add back his fish oil and continue with more aggressive therapeutic lifestyle changes  9. Morbid obesity ([place]) -The patient has a BMI of 38.7. He would be considered morbidly obese because he has 2 or more comorbidities risk factors. This is along with the BMI greater than 35.  Meds ordered this encounter  Medications  . rosuvastatin (CRESTOR) 5 MG tablet    Sig: Take 1 tablet (5 mg total) by mouth daily.    Dispense:  90 tablet    Refill:  3  . metFORMIN (GLUCOPHAGE) 500 MG tablet    Sig: Take 1 tablet (500 mg total) by mouth daily with breakfast.    Dispense:  90 tablet    Refill:  3   Patient Instructions  Continue current medications. Continue good therapeutic lifestyle changes which include good diet and exercise. Fall precautions discussed with patient. If an FOBT was given today- please return it to our front desk. If you are over 54 years old - you may need Prevnar 78 or the adult Pneumonia vaccine.  **Flu shots are available--- please call and schedule a FLU-CLINIC appointment**  After your visit with [place] today you will receive a survey in the mail or online from [employer] regarding your care with [place]. Please take a moment to fill this out. Your feedback is very important to [place] as you can help [place] better understand your patient needs as well  as improve your experience and satisfaction. WE CARE ABOUT YOU!!!   We will ask the patient to come back in 4-6 weeks and see the clinical pharmacists. He should bring blood sugar readings with him when he comes to that visit. We would ask her to check another lipid liver panel and review blood sugars and increase metformin if necessary We will add a uric acid to the current blood  work if that is possible If he is not taking his Fish oil, he should restart this. He should return the FOBT His vitamin D level is low and he should start vitamin D 50,000 units weekly.  Arrie Senate MD

## 2017-01-30 NOTE — Patient Instructions (Addendum)
Continue current medications. Continue good therapeutic lifestyle changes which include good diet and exercise. Fall precautions discussed with patient. If an FOBT was given today- please return it to our front desk. If you are over 50 years old - you may need Prevnar 73 or the adult Pneumonia vaccine.  **Flu shots are available--- please call and schedule a FLU-CLINIC appointment**  After your visit with [place] today you will receive a survey in the mail or online from [employer] regarding your care with [place]. Please take a moment to fill this out. Your feedback is very important to [place] as you can help [place] better understand your patient needs as well as improve your experience and satisfaction. WE CARE ABOUT YOU!!!   We will ask the patient to come back in 4-6 weeks and see the clinical pharmacists. He should bring blood sugar readings with him when he comes to that visit. We would ask her to check another lipid liver panel and review blood sugars and increase metformin if necessary We will add a uric acid to the current blood work if that is possible If he is not taking his Fish oil, he should restart this. He should return the FOBT His vitamin D level is low and he should start vitamin D 50,000 units weekly.

## 2017-02-03 LAB — URIC ACID: URIC ACID: 8.2 mg/dL (ref 3.7–8.6)

## 2017-02-03 LAB — SPECIMEN STATUS REPORT

## 2017-02-03 NOTE — Assessment & Plan Note (Signed)
Reminded that weight loss would help management of his medical problems, particularly CPAP

## 2017-02-03 NOTE — Assessment & Plan Note (Signed)
We reviewed compliance goals again, looking at his trips out of town. Generally he is doing very well and clearly better off with CPAP

## 2017-03-02 ENCOUNTER — Ambulatory Visit: Payer: 59 | Admitting: Pharmacist

## 2017-03-04 ENCOUNTER — Ambulatory Visit: Payer: 59 | Admitting: Pharmacist

## 2017-03-10 ENCOUNTER — Other Ambulatory Visit: Payer: Self-pay

## 2017-03-10 MED ORDER — DOXYCYCLINE HYCLATE 100 MG PO TABS: 100.0000 mg | ORAL_TABLET | Freq: Two times a day (BID) | ORAL | 0 refills | Status: DC

## 2017-05-01 IMAGING — CR DG SHOULDER 2+V*L*
3 series · 3 of 3 positions shown · non-contrast
Comparison: 04/14/2014.

CLINICAL DATA: Left shoulder pain.  Initial evaluation .

EXAM:
LEFT SHOULDER - 2+ VIEW

[view not recorded (1 of 3)]
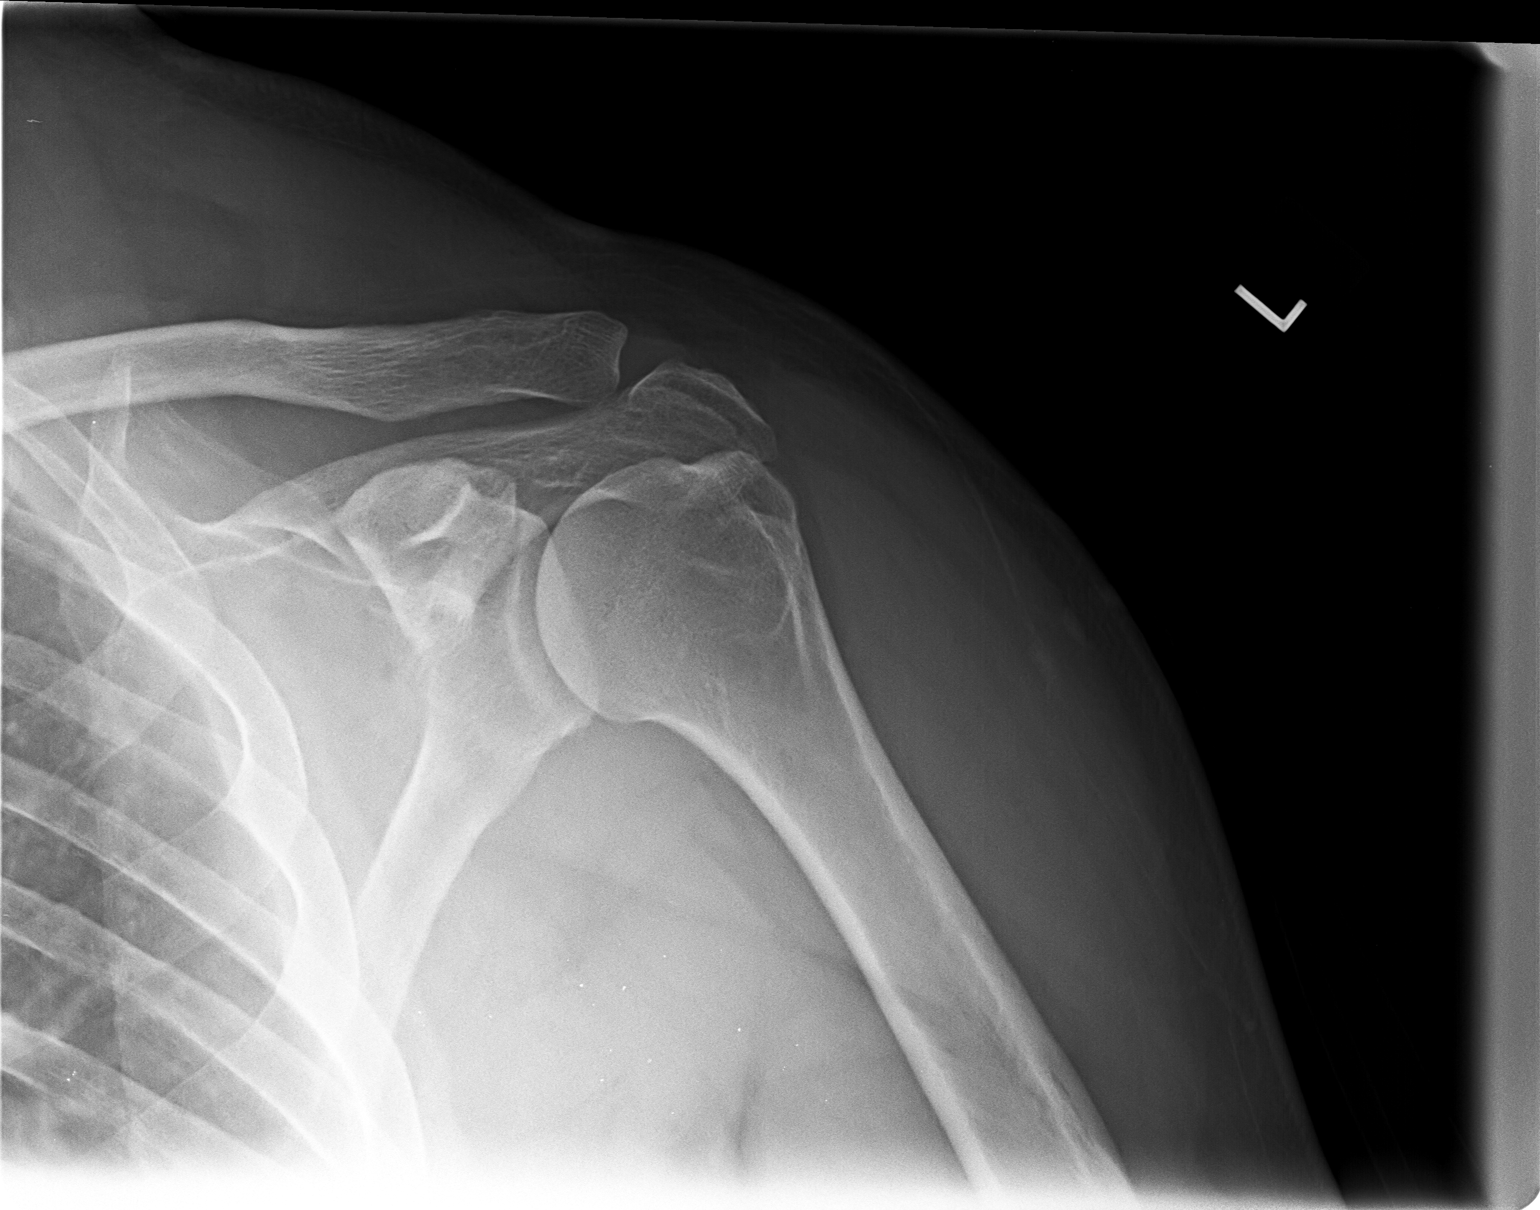

[view not recorded (2 of 3)]
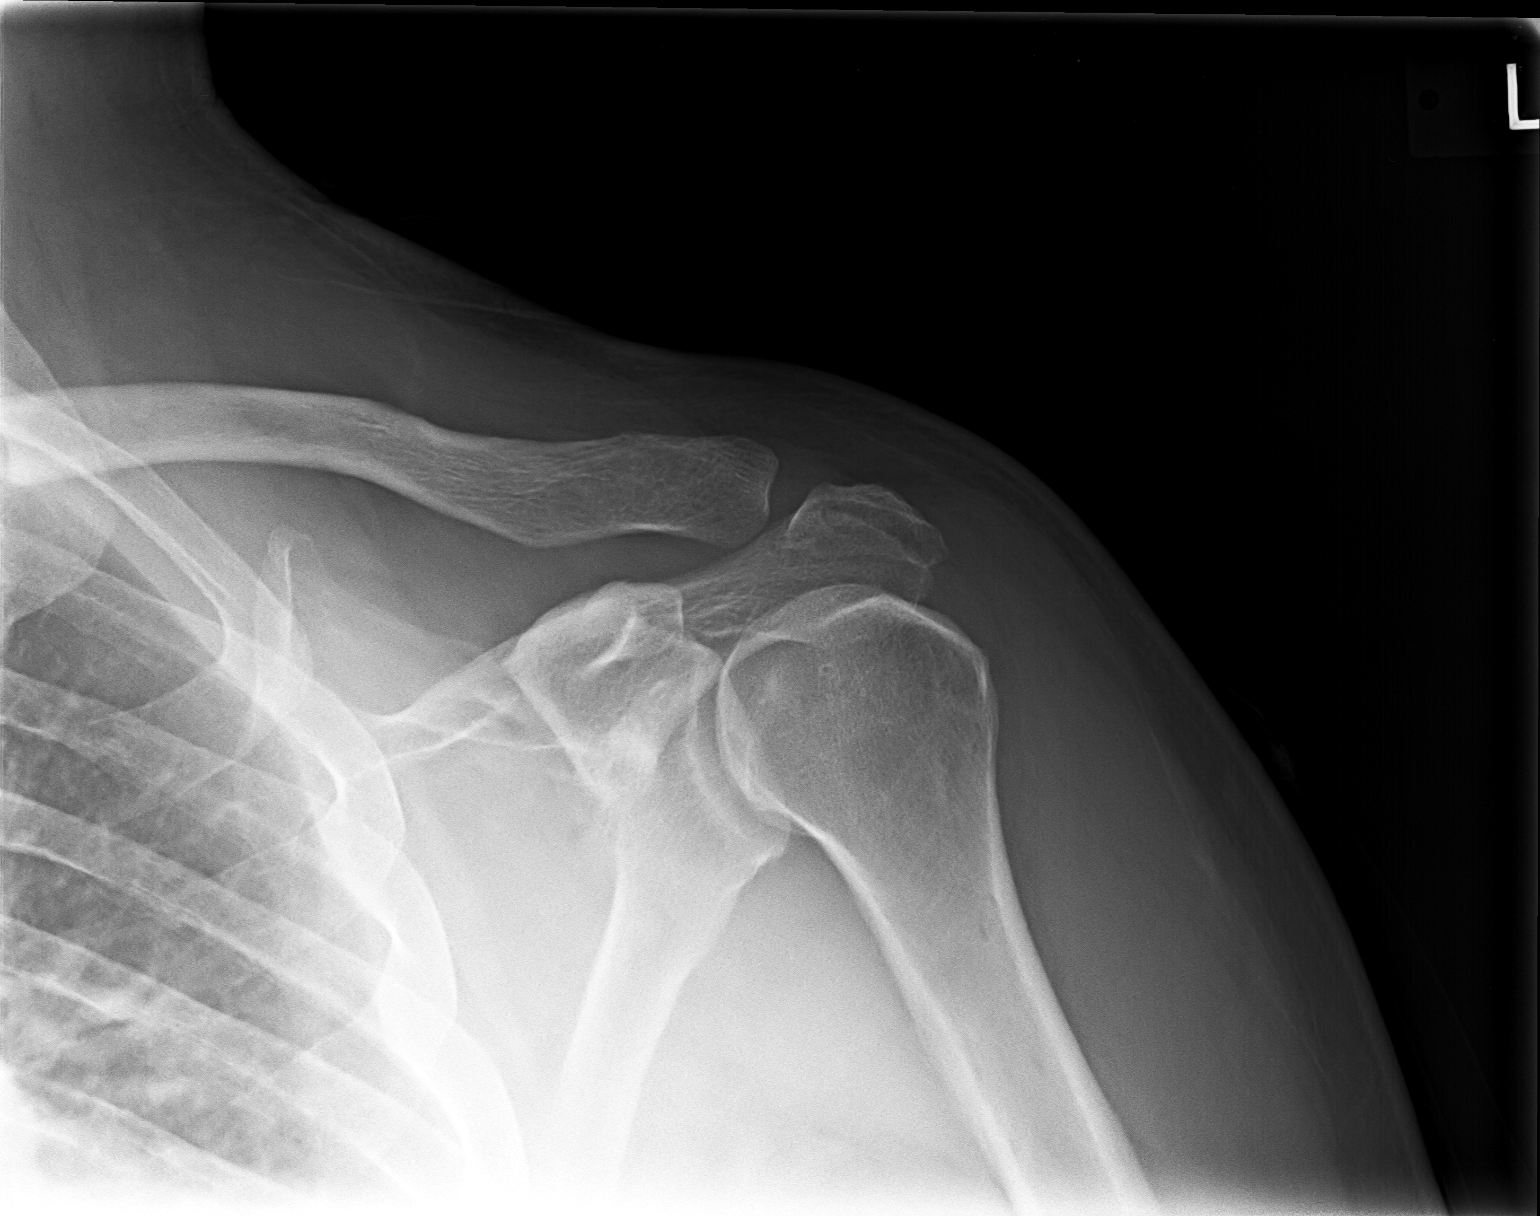

[view not recorded (3 of 3)]
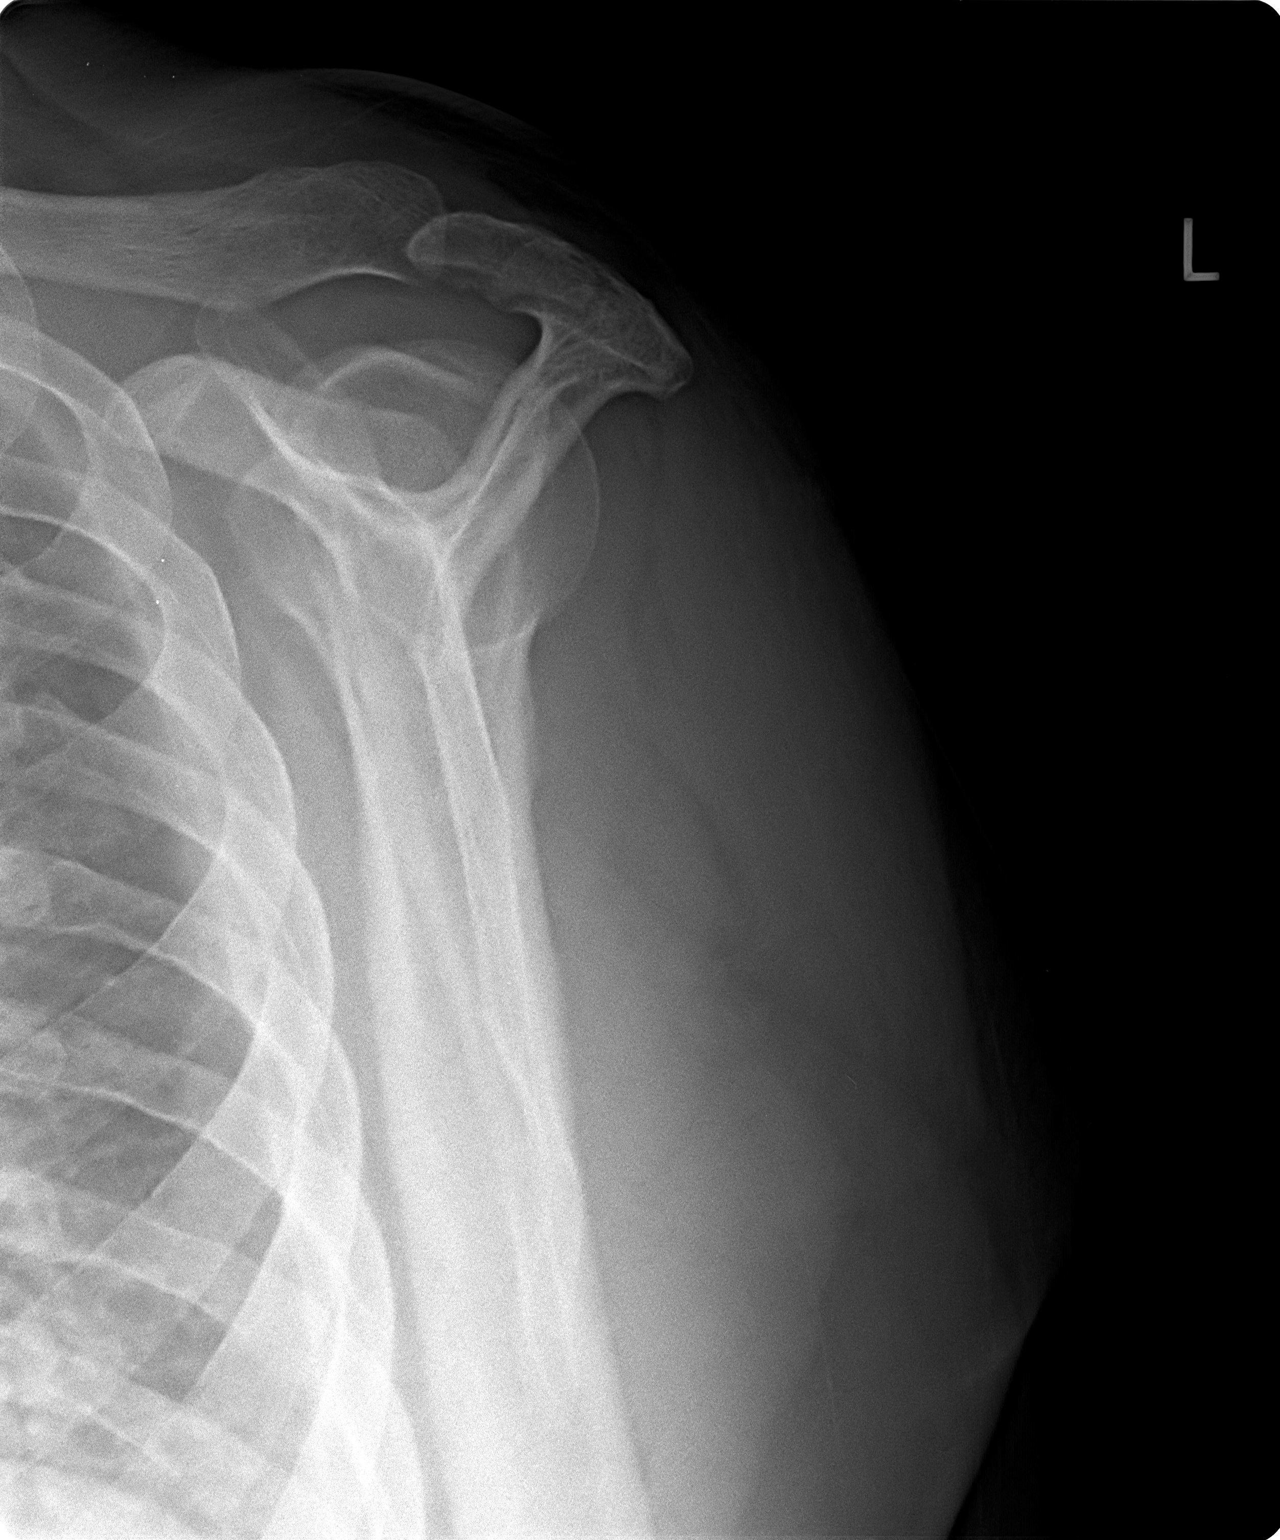

[3 of 3 positions shown; findings below may reference images not displayed]

FINDINGS: No acute bony or joint abnormality identified. Mild
acromioclavicular glenohumeral degenerative change.
IMPRESSION: No acute abnormality.  Mild degenerative changes left shoulder.

## 2017-05-23 ENCOUNTER — Ambulatory Visit (INDEPENDENT_AMBULATORY_CARE_PROVIDER_SITE_OTHER): Payer: 59 | Admitting: Family Medicine

## 2017-05-23 VITALS — BP 129/76 | HR 89 | Temp 101.7°F | Ht 67.0 in | Wt 239.0 lb

## 2017-05-23 DIAGNOSIS — A09 Infectious gastroenteritis and colitis, unspecified: Secondary | ICD-10-CM | POA: Diagnosis not present

## 2017-05-23 DIAGNOSIS — R51 Headache: Secondary | ICD-10-CM | POA: Diagnosis not present

## 2017-05-23 DIAGNOSIS — R509 Fever, unspecified: Secondary | ICD-10-CM

## 2017-05-23 DIAGNOSIS — R519 Headache, unspecified: Secondary | ICD-10-CM

## 2017-05-23 MED ORDER — DOXYCYCLINE HYCLATE 100 MG PO CAPS: 100.0000 mg | ORAL_CAPSULE | Freq: Two times a day (BID) | ORAL | 0 refills | Status: DC

## 2017-05-23 NOTE — Progress Notes (Signed)
Chief Complaint  Patient presents with  . Fever    pt here today c/o headache, fever and body aches.    HPI  Patient presents today for Onset yesterday evening of diarrhea 4. He felt achy all over and developed a fever of 102. The headache was a little bit later starting at bedtime. It became more severe through the night kept him awake much of the night. He has a lot of soreness and aching around the eyes and cheeks. His temperature today was 101.7 this is 2 hours after having taken full dose of Tylenol. There has been no diarrhea since last night. Patient denies cough. No earaches or sore throat.  PMH: Smoking status noted ROS: Per HPI  Objective: BP 129/76   Pulse 89   Temp (!) 101.7 F (38.7 C) (Oral)   Ht _0  (1.702 m)   Wt 239 lb (108.4 kg)   BMI 37.43 kg/m  Gen: NAD, alert, cooperative with exam HEENT: NCAT, EOMI, PERRL CV: RRR, good S1/S2, no murmur Resp: CTABL, no wheezes, non-labored Abd: SNTND, BS present, no guarding or organomegaly Ext: No edema, warm Neuro: Alert and oriented, No gross deficits  Assessment and plan:  1. Fever, unspecified fever cause   2. Acute nonintractable headache, unspecified headache type   3. Diarrhea of infectious origin     Meds ordered this encounter  Medications  . doxycycline (VIBRAMYCIN) 100 MG capsule    Sig: Take 1 capsule (100 mg total) by mouth 2 (two) times daily.    Dispense:  20 capsule    Refill:  0    Orders Placed This Encounter  Procedures  . CBC with Differential/Platelet  . CMP14+EGFR    Order Specific Question:   Has the patient fasted?    Answer:   Yes  . Urinalysis  . [place] m[place] spotted fvr abs pnl(IgG+IgM)  . Lyme Ab/Western Blot Reflex    Follow up as needed.  Claretta Fraise, MD

## 2017-05-25 ENCOUNTER — Telehealth: Payer: Self-pay | Admitting: Family Medicine

## 2017-05-25 ENCOUNTER — Other Ambulatory Visit: Payer: Self-pay

## 2017-05-25 ENCOUNTER — Ambulatory Visit (INDEPENDENT_AMBULATORY_CARE_PROVIDER_SITE_OTHER): Payer: 59 | Admitting: Family Medicine

## 2017-05-25 ENCOUNTER — Encounter: Payer: Self-pay | Admitting: Family Medicine

## 2017-05-25 ENCOUNTER — Ambulatory Visit (HOSPITAL_COMMUNITY)
Admission: RE | Admit: 2017-05-25 | Discharge: 2017-05-25 | Disposition: A | Discharge status: Home / Self Care | Payer: 59 | Source: Ambulatory Visit | Attending: Family Medicine | Admitting: Family Medicine

## 2017-05-25 VITALS — BP 114/78 | HR 69 | Temp 96.8°F | Ht 67.0 in | Wt 229.0 lb

## 2017-05-25 DIAGNOSIS — K6389 Other specified diseases of intestine: Secondary | ICD-10-CM | POA: Insufficient documentation

## 2017-05-25 DIAGNOSIS — R1031 Right lower quadrant pain: Secondary | ICD-10-CM

## 2017-05-25 DIAGNOSIS — K439 Ventral hernia without obstruction or gangrene: Secondary | ICD-10-CM | POA: Insufficient documentation

## 2017-05-25 DIAGNOSIS — N200 Calculus of kidney: Secondary | ICD-10-CM

## 2017-05-25 DIAGNOSIS — N202 Calculus of kidney with calculus of ureter: Secondary | ICD-10-CM | POA: Insufficient documentation

## 2017-05-25 LAB — URINALYSIS
BILIRUBIN UA: NEGATIVE
GLUCOSE, UA: NEGATIVE
Ketones, UA: NEGATIVE
Leukocytes, UA: NEGATIVE
Nitrite, UA: NEGATIVE
PH UA: 5.5 (ref 5.0–7.5)
PROTEIN UA: NEGATIVE
Specific Gravity, UA: 1.02 (ref 1.005–1.030)
Urobilinogen, Ur: 0.2 mg/dL (ref 0.2–1.0)

## 2017-05-25 MED ORDER — IOPAMIDOL (ISOVUE-300) INJECTION 61%
100.0000 mL | Freq: Once | INTRAVENOUS | Status: AC | PRN
  Administered 2017-05-25: 100 mL via INTRAVENOUS

## 2017-05-25 MED ORDER — TAMSULOSIN HCL 0.4 MG PO CAPS: 0.4000 mg | ORAL_CAPSULE | Freq: Every day | ORAL | 3 refills | Status: DC

## 2017-05-25 MED ORDER — ONDANSETRON 4 MG PO TBDP: 4.0000 mg | ORAL_TABLET | Freq: Three times a day (TID) | ORAL | 0 refills | Status: DC | PRN

## 2017-05-25 NOTE — Progress Notes (Signed)
BP 114/78   Pulse 69   Temp (!) 96.8 F (36 C) (Oral)   Ht 5\' 7"  (1.702 m)   Wt 229 lb (103.9 kg)   BMI 35.87 kg/m    Subjective:    Patient ID: Jimmy Perez., male    DOB: 1965/12/09, 51 y.o.   MRN: [id number]  HPI: Jimmy Perez. is a 51 y.o. male presenting on 05/25/2017 for Diarrhea (was seen on Saturday with fever (100-102), severe headache, diarrhea and some vomiting; not feeling a lot better); Vomiting x 2; and Abdominal Pain (lower abdomen, tender)   HPI Right lower quadrant abdominal pain Patient comes in today with a three-day history of abdominal pain and nausea and vomiting and headache. He says it all began Friday night with a headache and diarrhea and lower abdominal pain. He also developed a fever over Friday night into Saturday which was in the 102-103 range. He was using Tylenol and it was coming down but then would come right back up again. He did have vomiting 2 yesterday but none since. He has been having continued diarrhea. He denies any blood in his stool. He has been feeling nauseous still as well. His headache has improved today and he has been keeping down a little bit more fluids. He has not had a temperature above 99 today yet. He says that the fever has improved some but otherwise he still feels very ill and not feeling much better.  Relevant past medical, surgical, family and social history reviewed and updated as indicated. Interim medical history since our last visit reviewed. Allergies and medications reviewed and updated.  Review of Systems  Constitutional: Negative for chills and fever.  Eyes: Negative for discharge.  Respiratory: Negative for shortness of breath and wheezing.   Cardiovascular: Negative for chest pain and leg swelling.  Gastrointestinal: Positive for abdominal distention, abdominal pain, diarrhea, nausea and vomiting. Negative for anal bleeding, blood in stool and constipation.  Musculoskeletal: Negative for back pain  and gait problem.  Skin: Negative for rash.  Neurological: Positive for headaches. Negative for dizziness, weakness and numbness.  All other systems reviewed and are negative.   Per HPI unless specifically indicated above     Objective:    BP 114/78   Pulse 69   Temp (!) 96.8 F (36 C) (Oral)   Ht 5\' 7"  (1.702 m)   Wt 229 lb (103.9 kg)   BMI 35.87 kg/m   Wt Readings from Last 3 Encounters:  05/25/17 229 lb (103.9 kg)  05/23/17 239 lb (108.4 kg)  01/30/17 242 lb (109.8 kg)    Physical Exam  Constitutional: He is oriented to person, place, and time. He appears well-developed and well-nourished. No distress.  Eyes: Conjunctivae are normal. No scleral icterus.  Cardiovascular: Normal rate, regular rhythm, normal heart sounds and intact distal pulses.   No murmur heard. Pulmonary/Chest: Effort normal and breath sounds normal. No respiratory distress. He has no wheezes. He has no rales.  Abdominal: Soft. Bowel sounds are normal. He exhibits no distension. There is no hepatosplenomegaly. There is tenderness in the right lower quadrant. There is rebound. There is no rigidity, no guarding and no CVA tenderness.  Musculoskeletal: Normal range of motion. He exhibits no edema.  Neurological: He is alert and oriented to person, place, and time. Coordination normal.  Skin: Skin is warm and dry. No rash noted. He is not diaphoretic.  Psychiatric: He has a normal mood and affect. His behavior is normal.  Nursing note and vitals reviewed.      Assessment & Plan:   Problem List Items Addressed This Visit    None    Visit Diagnoses    Right lower quadrant abdominal pain    -  Primary   Relevant Orders   CT ABDOMEN PELVIS W WO CONTRAST     Will send for CT abdomen and pelvis. Ruling out appendicitis. If it comes back not appendicitis, likely will add Flagyl and Zofran to treat for diverticulitis.   Follow up plan: Return if symptoms worsen or fail to improve.  Counseling provided  for all of the vaccine components Orders Placed This Encounter  Procedures  . CT ABDOMEN PELVIS W WO CONTRAST    Caryl Pina, MD [facility] 05/25/2017, 8:37 AM

## 2017-05-25 NOTE — Telephone Encounter (Signed)
Patient's CT scan showed that he had a large renal stone that was 7 x 6 mm, will do a urology referral, no hydronephrosis on this CT but we'll need to monitor for that. Contacted patient personally discussed this with him to put in urology referral for him. Sent in Zofran and Flomax along with the referral. Recommended strainer for patient as well to catch stone. Has appt to see [facility] at 3 PM today with their on-call doctor. Caryl Pina, MD [facility] 05/25/2017, 1:12 PM

## 2017-05-26 ENCOUNTER — Other Ambulatory Visit: Payer: Self-pay | Admitting: Urology

## 2017-05-26 LAB — CMP14+EGFR
A/G RATIO: 2.1 (ref 1.2–2.2)
ALBUMIN: 4.8 g/dL (ref 3.5–5.5)
ALT: 24 IU/L (ref 0–44)
AST: 14 IU/L (ref 0–40)
Alkaline Phosphatase: 73 IU/L (ref 39–117)
BILIRUBIN TOTAL: 0.6 mg/dL (ref 0.0–1.2)
BUN / CREAT RATIO: 14 (ref 9–20)
BUN: 16 mg/dL (ref 6–24)
CALCIUM: 9.7 mg/dL (ref 8.7–10.2)
CHLORIDE: 100 mmol/L (ref 96–106)
CO2: 20 mmol/L (ref 20–29)
Creatinine, Ser: 1.11 mg/dL (ref 0.76–1.27)
GFR, EST AFRICAN AMERICAN: 89 mL/min/{1.73_m2} (ref >59)
GFR, EST NON AFRICAN AMERICAN: 77 mL/min/{1.73_m2} (ref >59)
GLOBULIN, TOTAL: 2.3 g/dL (ref 1.5–4.5)
Glucose: 94 mg/dL (ref 65–99)
POTASSIUM: 4.9 mmol/L (ref 3.5–5.2)
SODIUM: 139 mmol/L (ref 134–144)
TOTAL PROTEIN: 7.1 g/dL (ref 6.0–8.5)

## 2017-05-26 LAB — CBC WITH DIFFERENTIAL/PLATELET
BASOS ABS: 0 10*3/uL (ref 0.0–0.2)
Basos: 0 %
EOS (ABSOLUTE): 0.1 10*3/uL (ref 0.0–0.4)
Eos: 1 %
HEMOGLOBIN: 15.2 g/dL (ref 13.0–17.7)
Hematocrit: 44.9 % (ref 37.5–51.0)
IMMATURE GRANS (ABS): 0 10*3/uL (ref 0.0–0.1)
Immature Granulocytes: 0 %
LYMPHS ABS: 0.6 10*3/uL — AB (ref 0.7–3.1)
LYMPHS: 5 %
MCH: 32.3 pg (ref 26.6–33.0)
MCHC: 33.9 g/dL (ref 31.5–35.7)
MCV: 95 fL (ref 79–97)
Monocytes Absolute: 0.7 10*3/uL (ref 0.1–0.9)
Monocytes: 6 %
NEUTROS ABS: 9.9 10*3/uL — AB (ref 1.4–7.0)
Neutrophils: 88 %
PLATELETS: 262 10*3/uL (ref 150–379)
RBC: 4.71 x10E6/uL (ref 4.14–5.80)
RDW: 13 % (ref 12.3–15.4)
WBC: 11.3 10*3/uL — ABNORMAL HIGH (ref 3.4–10.8)

## 2017-05-26 LAB — ROCKY MTN SPOTTED FVR ABS PNL(IGG+IGM)
RMSF IgG: NEGATIVE
RMSF IgM: 0.14 index (ref 0.00–0.89)

## 2017-05-26 LAB — LYME AB/WESTERN BLOT REFLEX

## 2017-05-27 ENCOUNTER — Telehealth: Payer: Self-pay

## 2017-05-27 ENCOUNTER — Encounter (HOSPITAL_COMMUNITY): Payer: Self-pay | Admitting: *Deleted

## 2017-05-27 NOTE — Progress Notes (Signed)
Called Selita ,and LM on VM to Fax orders for a CBC and HGA1C to [facility] for them to draw lab work for patient's surgery on 05/29/2017.

## 2017-05-27 NOTE — Progress Notes (Signed)
Called patient via phone and obtained phone consent from patient to obtain medical history from wife .Conversation witnessed by [employer]. [place] Blas, RN and Peyton Najjar, Therapist, sports.

## 2017-05-27 NOTE — Telephone Encounter (Signed)
Patient needs work note from 05/25/17 through 06/01/17.  He will be having surgery on Friday 05/29/17 for stent placement.  Note complete.

## 2017-05-28 ENCOUNTER — Other Ambulatory Visit (HOSPITAL_COMMUNITY): Payer: 59

## 2017-05-28 ENCOUNTER — Other Ambulatory Visit: Payer: 59

## 2017-05-28 ENCOUNTER — Other Ambulatory Visit: Payer: Self-pay

## 2017-05-28 DIAGNOSIS — R933 Abnormal findings on diagnostic imaging of other parts of digestive tract: Secondary | ICD-10-CM

## 2017-05-28 DIAGNOSIS — E8881 Metabolic syndrome: Secondary | ICD-10-CM

## 2017-05-28 LAB — CBC WITH DIFFERENTIAL/PLATELET
BASOS: 0 %
Basophils Absolute: 0 10*3/uL (ref 0.0–0.2)
EOS (ABSOLUTE): 0.1 10*3/uL (ref 0.0–0.4)
Eos: 2 %
Hematocrit: 38.9 % (ref 37.5–51.0)
Hemoglobin: 13.5 g/dL (ref 13.0–17.7)
LYMPHS ABS: 2 10*3/uL (ref 0.7–3.1)
Lymphs: 30 %
MCH: 32.2 pg (ref 26.6–33.0)
MCHC: 34.7 g/dL (ref 31.5–35.7)
MCV: 93 fL (ref 79–97)
MONOS ABS: 0.5 10*3/uL (ref 0.1–0.9)
Monocytes: 7 %
Neutrophils Absolute: 3.5 10*3/uL (ref 1.4–7.0)
Neutrophils: 52 %
PLATELETS: 268 10*3/uL (ref 150–379)
RBC: 4.19 x10E6/uL (ref 4.14–5.80)
RDW: 13.5 % (ref 12.3–15.4)
WBC: 6.5 10*3/uL (ref 3.4–10.8)

## 2017-05-28 LAB — HEMOGLOBIN A1C
Est. average glucose Bld gHb Est-mCnc: 140 mg/dL
HEMOGLOBIN A1C: 6.5 % — AB (ref 4.8–5.6)

## 2017-05-28 LAB — IMMATURE CELLS
Bands(Auto) Relative: 2 %
METAMYELOCYTES: 4 % — AB (ref 0–0)
MYELOCYTES: 3 % — AB (ref 0–0)

## 2017-05-28 MED ORDER — GENTAMICIN SULFATE 40 MG/ML IJ SOLN
5.0000 mg/kg | INTRAVENOUS | Status: AC
  Administered 2017-05-29: 410 mg via INTRAVENOUS
  Filled 2017-05-28: qty 10.25

## 2017-05-29 ENCOUNTER — Ambulatory Visit (HOSPITAL_COMMUNITY): Payer: 59 | Admitting: Certified Registered Nurse Anesthetist

## 2017-05-29 ENCOUNTER — Ambulatory Visit (HOSPITAL_COMMUNITY): Payer: 59

## 2017-05-29 ENCOUNTER — Encounter (HOSPITAL_COMMUNITY): Payer: Self-pay | Admitting: *Deleted

## 2017-05-29 ENCOUNTER — Encounter (HOSPITAL_COMMUNITY)
Admission: RE | Disposition: A | Discharge status: Home / Self Care | Payer: Self-pay | Source: Ambulatory Visit | Attending: Urology

## 2017-05-29 ENCOUNTER — Ambulatory Visit (HOSPITAL_COMMUNITY)
Admission: RE | Admit: 2017-05-29 | Discharge: 2017-05-29 | Disposition: A | Discharge status: Home / Self Care | Payer: 59 | Source: Ambulatory Visit | Attending: Urology | Admitting: Urology

## 2017-05-29 DIAGNOSIS — Z8249 Family history of ischemic heart disease and other diseases of the circulatory system: Secondary | ICD-10-CM | POA: Insufficient documentation

## 2017-05-29 DIAGNOSIS — Z8349 Family history of other endocrine, nutritional and metabolic diseases: Secondary | ICD-10-CM | POA: Diagnosis not present

## 2017-05-29 DIAGNOSIS — N132 Hydronephrosis with renal and ureteral calculous obstruction: Secondary | ICD-10-CM | POA: Insufficient documentation

## 2017-05-29 DIAGNOSIS — N201 Calculus of ureter: Secondary | ICD-10-CM | POA: Diagnosis present

## 2017-05-29 DIAGNOSIS — E559 Vitamin D deficiency, unspecified: Secondary | ICD-10-CM | POA: Diagnosis not present

## 2017-05-29 DIAGNOSIS — Z833 Family history of diabetes mellitus: Secondary | ICD-10-CM | POA: Insufficient documentation

## 2017-05-29 DIAGNOSIS — Z8371 Family history of colonic polyps: Secondary | ICD-10-CM | POA: Insufficient documentation

## 2017-05-29 DIAGNOSIS — Z6835 Body mass index (BMI) 35.0-35.9, adult: Secondary | ICD-10-CM | POA: Insufficient documentation

## 2017-05-29 DIAGNOSIS — K219 Gastro-esophageal reflux disease without esophagitis: Secondary | ICD-10-CM | POA: Insufficient documentation

## 2017-05-29 DIAGNOSIS — E785 Hyperlipidemia, unspecified: Secondary | ICD-10-CM | POA: Insufficient documentation

## 2017-05-29 DIAGNOSIS — G4733 Obstructive sleep apnea (adult) (pediatric): Secondary | ICD-10-CM | POA: Diagnosis not present

## 2017-05-29 DIAGNOSIS — E669 Obesity, unspecified: Secondary | ICD-10-CM | POA: Insufficient documentation

## 2017-05-29 DIAGNOSIS — I10 Essential (primary) hypertension: Secondary | ICD-10-CM | POA: Diagnosis not present

## 2017-05-29 DIAGNOSIS — Z720 Tobacco use: Secondary | ICD-10-CM | POA: Insufficient documentation

## 2017-05-29 DIAGNOSIS — Z9889 Other specified postprocedural states: Secondary | ICD-10-CM | POA: Diagnosis not present

## 2017-05-29 DIAGNOSIS — E119 Type 2 diabetes mellitus without complications: Secondary | ICD-10-CM | POA: Insufficient documentation

## 2017-05-29 HISTORY — PX: HOLMIUM LASER APPLICATION: SHX5852

## 2017-05-29 HISTORY — PX: CYSTOSCOPY WITH RETROGRADE PYELOGRAM, URETEROSCOPY AND STENT PLACEMENT: SHX5789

## 2017-05-29 LAB — GLUCOSE, CAPILLARY
GLUCOSE-CAPILLARY: 110 mg/dL — AB (ref 65–99)
GLUCOSE-CAPILLARY: 110 mg/dL — AB (ref 65–99)

## 2017-05-29 SURGERY — CYSTOURETEROSCOPY, WITH RETROGRADE PYELOGRAM AND STENT INSERTION
Anesthesia: General | Site: Ureter | Laterality: Right

## 2017-05-29 MED ORDER — OXYCODONE-ACETAMINOPHEN 5-325 MG PO TABS: 1.0000 | ORAL_TABLET | Freq: Four times a day (QID) | ORAL | 0 refills | Status: DC | PRN

## 2017-05-29 MED ORDER — SENNOSIDES-DOCUSATE SODIUM 8.6-50 MG PO TABS: 1.0000 | ORAL_TABLET | Freq: Two times a day (BID) | ORAL | 0 refills | Status: DC

## 2017-05-29 MED ORDER — KETOROLAC TROMETHAMINE 30 MG/ML IJ SOLN
30.0000 mg | Freq: Once | INTRAMUSCULAR | Status: DC | PRN
Start: 2017-05-29 — End: 2017-05-29

## 2017-05-29 MED ORDER — FENTANYL CITRATE (PF) 100 MCG/2ML IJ SOLN: 25.0000 ug | INTRAMUSCULAR | Status: DC | PRN

## 2017-05-29 MED ORDER — PROMETHAZINE HCL 25 MG/ML IJ SOLN: 6.2500 mg | INTRAMUSCULAR | Status: DC | PRN

## 2017-05-29 MED ORDER — KETOROLAC TROMETHAMINE 10 MG PO TABS: 10.0000 mg | ORAL_TABLET | Freq: Four times a day (QID) | ORAL | 1 refills | Status: DC | PRN

## 2017-05-29 MED ORDER — DEXAMETHASONE SODIUM PHOSPHATE 10 MG/ML IJ SOLN
INTRAMUSCULAR | Status: DC | PRN
  Administered 2017-05-29: 4 mg via INTRAVENOUS

## 2017-05-29 MED ORDER — MIDAZOLAM HCL 5 MG/5ML IJ SOLN
INTRAMUSCULAR | Status: DC | PRN
  Administered 2017-05-29: 2 mg via INTRAVENOUS

## 2017-05-29 MED ORDER — LIDOCAINE 2% (20 MG/ML) 5 ML SYRINGE
INTRAMUSCULAR | Status: AC
  Filled 2017-05-29: qty 5

## 2017-05-29 MED ORDER — LIDOCAINE 2% (20 MG/ML) 5 ML SYRINGE
INTRAMUSCULAR | Status: DC | PRN
  Administered 2017-05-29: 80 mg via INTRAVENOUS

## 2017-05-29 MED ORDER — EPHEDRINE SULFATE-NACL 50-0.9 MG/10ML-% IV SOSY
PREFILLED_SYRINGE | INTRAVENOUS | Status: DC | PRN
  Administered 2017-05-29 (×3): 10 mg via INTRAVENOUS

## 2017-05-29 MED ORDER — IOHEXOL 300 MG/ML  SOLN
INTRAMUSCULAR | Status: DC | PRN
  Administered 2017-05-29: 10 mL via URETHRAL

## 2017-05-29 MED ORDER — FENTANYL CITRATE (PF) 100 MCG/2ML IJ SOLN
INTRAMUSCULAR | Status: AC
  Filled 2017-05-29: qty 2

## 2017-05-29 MED ORDER — ONDANSETRON HCL 4 MG/2ML IJ SOLN
INTRAMUSCULAR | Status: DC | PRN
Start: 2017-05-29 — End: 2017-05-29
  Administered 2017-05-29: 4 mg via INTRAVENOUS

## 2017-05-29 MED ORDER — PROPOFOL 10 MG/ML IV BOLUS
INTRAVENOUS | Status: DC | PRN
  Administered 2017-05-29: 200 mg via INTRAVENOUS

## 2017-05-29 MED ORDER — MIDAZOLAM HCL 2 MG/2ML IJ SOLN
INTRAMUSCULAR | Status: AC
  Filled 2017-05-29: qty 2

## 2017-05-29 MED ORDER — LACTATED RINGERS IV SOLN
INTRAVENOUS | Status: DC
  Administered 2017-05-29 (×2): via INTRAVENOUS

## 2017-05-29 MED ORDER — FENTANYL CITRATE (PF) 100 MCG/2ML IJ SOLN
INTRAMUSCULAR | Status: DC | PRN
  Administered 2017-05-29 (×2): 50 ug via INTRAVENOUS

## 2017-05-29 MED ORDER — CEPHALEXIN 500 MG PO CAPS: 500.0000 mg | ORAL_CAPSULE | Freq: Two times a day (BID) | ORAL | 0 refills | Status: DC

## 2017-05-29 SURGICAL SUPPLY — 26 items
BAG URO CATCHER STRL LF (MISCELLANEOUS) ×2 IMPLANT
BASKET LASER NITINOL 1.9FR (BASKET) IMPLANT
BASKET STONE NITINOL 3FR/115 (UROLOGICAL SUPPLIES) ×2 IMPLANT
BSKT STON RTRVL 120 1.9FR (BASKET)
CATH INTERMIT  6FR 70CM (CATHETERS) ×2 IMPLANT
CLOTH BEACON ORANGE TIMEOUT ST (SAFETY) ×2 IMPLANT
COVER SURGICAL LIGHT HANDLE (MISCELLANEOUS) ×2 IMPLANT
FIBER LASER FLEXIVA 1000 (UROLOGICAL SUPPLIES) IMPLANT
FIBER LASER FLEXIVA 365 (UROLOGICAL SUPPLIES) IMPLANT
FIBER LASER FLEXIVA 550 (UROLOGICAL SUPPLIES) IMPLANT
FIBER LASER TRAC TIP (UROLOGICAL SUPPLIES) ×2 IMPLANT
GLOVE BIOGEL M STRL SZ7.5 (GLOVE) ×2 IMPLANT
GOWN STRL REUS W/TWL LRG LVL3 (GOWN DISPOSABLE) ×4 IMPLANT
GUIDEWIRE ANG ZIPWIRE 038X150 (WIRE) ×2 IMPLANT
GUIDEWIRE STR DUAL SENSOR (WIRE) ×2 IMPLANT
IV NS 1000ML (IV SOLUTION) ×2
IV NS 1000ML BAXH (IV SOLUTION) ×1 IMPLANT
MANIFOLD NEPTUNE II (INSTRUMENTS) ×2 IMPLANT
PACK CYSTO (CUSTOM PROCEDURE TRAY) ×2 IMPLANT
SHEATH ACCESS URETERAL 24CM (SHEATH) IMPLANT
SHEATH ACCESS URETERAL 38CM (SHEATH) IMPLANT
SHEATH ACCESS URETERAL 54CM (SHEATH) IMPLANT
STENT POLARIS 5FRX26 (STENTS) ×2 IMPLANT
SYR CONTROL 10ML LL (SYRINGE) ×2 IMPLANT
TUBE FEEDING 8FR 16IN STR KANG (MISCELLANEOUS) ×2 IMPLANT
TUBING CONNECTING 10 (TUBING) ×2 IMPLANT

## 2017-05-29 NOTE — Anesthesia Preprocedure Evaluation (Signed)
Anesthesia Evaluation  Patient identified by MRN, date of birth, ID band Patient awake    Reviewed: Allergy & Precautions, NPO status , Patient's Chart, lab work & pertinent test results  Airway Mallampati: II  TM Distance: <3 FB Neck ROM: Full    Dental no notable dental hx.    Pulmonary sleep apnea and Continuous Positive Airway Pressure Ventilation ,    Pulmonary exam normal breath sounds clear to auscultation       Cardiovascular hypertension, Normal cardiovascular exam Rhythm:Regular Rate:Normal     Neuro/Psych negative neurological ROS  negative psych ROS   GI/Hepatic Neg liver ROS, GERD  Medicated,  Endo/Other    Renal/GU negative Renal ROS  negative genitourinary   Musculoskeletal negative musculoskeletal ROS (+)   Abdominal   Peds negative pediatric ROS (+)  Hematology negative hematology ROS (+)   Anesthesia Other Findings   Reproductive/Obstetrics negative OB ROS                             Anesthesia Physical Anesthesia Plan  ASA: II  Anesthesia Plan: General   Post-op Pain Management:    Induction: Intravenous  PONV Risk Score and Plan: 2 and Ondansetron and Dexamethasone  Airway Management Planned: LMA  Additional Equipment:   Intra-op Plan:   Post-operative Plan:   Informed Consent: I have reviewed the patients History and Physical, chart, labs and discussed the procedure including the risks, benefits and alternatives for the proposed anesthesia with the patient or authorized representative who has indicated his/her understanding and acceptance.   Dental advisory given  Plan Discussed with: CRNA and Surgeon  Anesthesia Plan Comments:         Anesthesia Quick Evaluation

## 2017-05-29 NOTE — Discharge Instructions (Signed)
1 - You may have urinary urgency (bladder spasms) and bloody urine on / off with stent in place. This is normal.  2 - Remove tethered stent on Monday morning at home by pulling on string, then blue-white plastic tubing, and discarding. Office is open Monday if any issues arise.   3 - Call MD or go to ER for fever >102, severe pain / nausea / vomiting not relieved by medications, or acute change in medical status    General Anesthesia, Adult, Care After These instructions provide you with information about caring for yourself after your procedure. Your health care provider may also give you more specific instructions. Your treatment has been planned according to current medical practices, but problems sometimes occur. Call your health care provider if you have any problems or questions after your procedure. What can I expect after the procedure? After the procedure, it is common to have:  Vomiting.  A sore throat.  Mental slowness.  It is common to feel:  Nauseous.  Cold or shivery.  Sleepy.  Tired.  Sore or achy, even in parts of your body where you did not have surgery.  Follow these instructions at home: For at least 24 hours after the procedure:  Do not: ? Participate in activities where you could fall or become injured. ? Drive. ? Use heavy machinery. ? Drink alcohol. ? Take sleeping pills or medicines that cause drowsiness. ? Make important decisions or sign legal documents. ? Take care of children on your own.  Rest. Eating and drinking  If you vomit, drink water, juice, or soup when you can drink without vomiting.  Drink enough fluid to keep your urine clear or pale yellow.  Make sure you have little or no nausea before eating solid foods.  Follow the diet recommended by your health care provider. General instructions  Have a responsible adult stay with you until you are awake and alert.  Return to your normal activities as told by your health care  provider. Ask your health care provider what activities are safe for you.  Take over-the-counter and prescription medicines only as told by your health care provider.  If you smoke, do not smoke without supervision.  Keep all follow-up visits as told by your health care provider. This is important. Contact a health care provider if:  You continue to have nausea or vomiting at home, and medicines are not helpful.  You cannot drink fluids or start eating again.  You cannot urinate after 8-12 hours.  You develop a skin rash.  You have fever.  You have increasing redness at the site of your procedure. Get help right away if:  You have difficulty breathing.  You have chest pain.  You have unexpected bleeding.  You feel that you are having a life-threatening or urgent problem. This information is not intended to replace advice given to you by your health care provider. Make sure you discuss any questions you have with your health care provider. Document Released: 01/19/2001 Document Revised: 03/17/2016 Document Reviewed: 09/27/2015 Elsevier Interactive Patient Education  [employer].

## 2017-05-29 NOTE — Transfer of Care (Signed)
Immediate Anesthesia Transfer of Care Note  Patient: Jimmy Perez.  Procedure(s) Performed: Procedure(s): CYSTOSCOPY WITH RETROGRADE PYELOGRAM, URETEROSCOPY AND STENT PLACEMENT (Right) HOLMIUM LASER APPLICATION (Right)  Patient Location: PACU  Anesthesia Type:General  Level of Consciousness:  Alert, patient cooperative and responds to stimulation  Airway & Oxygen Therapy:Patient Spontanous Breathing and Patient connected to face mask oxgen  Post-op Assessment:  Report given to PACU RN and Post -op Vital signs reviewed and stable  Post vital signs:  Reviewed and stable  Last Vitals:  Vitals:   05/29/17 1330  BP: 119/67  Pulse: 70  Resp: 16  Temp: 14.8 C    Complications: No apparent anesthesia complications

## 2017-05-29 NOTE — Interval H&P Note (Signed)
History and Physical Interval Note:  05/29/2017 4:08 PM  Jimmy Perez.  has presented today for surgery, with the diagnosis of RIGHT URETERAL STONE  The various methods of treatment have been discussed with the patient and family. After consideration of risks, benefits and other options for treatment, the patient has consented to  Procedure(s): CYSTOSCOPY WITH RETROGRADE PYELOGRAM, URETEROSCOPY AND STENT PLACEMENT (Right) HOLMIUM LASER APPLICATION (Right) as a surgical intervention .  The patient's history has been reviewed, patient examined, no change in status, stable for surgery.  I have reviewed the patient's chart and labs.  Questions were answered to the patient's satisfaction.     Tyronne Blann

## 2017-05-29 NOTE — H&P (Signed)
Jimmy Perez. is an 51 y.o. male.    Chief Complaint: Pre-op RIGHT Ureteroscopic Stone Manipulation  HPI:   1 - Urolithiasis - Rt 36mm UVJ stone with mild hydro as well as 6mm left intra-renal stone by ER CT 04/2017 on eval RLQ pain. Significant colitis noted too. UA normal. Not much perinephric stranding.   PMH sig for ENT surgery, HTN, Dm2 (A1c 7s), Obesity. His PCP is Redge Gainer MD.   Today "Jimmy Perez" is seen to proceed with RIGHT ureteroscopic stone manipulation. NO interval fevers. Most recent UA and UCX (from this week) negative for infectious parameters.    Past Medical History:  Diagnosis Date  . Borderline diabetes    no meds, watching diet.  . Diabetes mellitus without complication ([place])   . GERD (gastroesophageal reflux disease)   . Hyperlipidemia   . Hypertension   . OSA (obstructive sleep apnea)    Cpap use--study 12-26-14 Epic suggestive of this.  . Pre-diabetes    "per patient"  . Vitamin D deficiency     Past Surgical History:  Procedure Laterality Date  . EUS N/A 02/08/2015   Procedure: UPPER ENDOSCOPIC ULTRASOUND (EUS) LINEAR;  Surgeon: [name], MD;  Location: [facility];  Service: Endoscopy;  Laterality: N/A;  . EUS N/A 09/25/2016   Procedure: UPPER ENDOSCOPIC ULTRASOUND (EUS) RADIAL;  Surgeon: [name], MD;  Location: [facility];  Service: Endoscopy;  Laterality: N/A;  . KNEE ARTHROSCOPY Right 11/28/95   [name])    Family History  Problem Relation Age of Onset  . Diabetes Mother   . Hyperlipidemia Father   . Heart disease Father   . Hypertension Father   . Diabetes Father   . Diabetes Sister   . Hypertension Sister   . Hyperlipidemia Brother   . Hypertension Brother   . Colon polyps Brother   . Colon cancer Neg Hx   . Stomach cancer Neg Hx    Social History:  reports that he has never smoked. His smokeless tobacco use includes Snuff. He reports that he drinks about 3.6 oz of alcohol per week . He reports that he does  not use drugs.  Allergies: No Known Allergies  No prescriptions prior to admission.    Results for orders placed or performed in visit on 05/28/17 (from the past 48 hour(s))  CBC with Differential/Platelet     Status: None   Collection Time: 05/28/17  9:11 AM  Result Value Ref Range   WBC 6.5 3.4 - 10.8 x10E3/uL   RBC 4.19 4.14 - 5.80 x10E6/uL   Hemoglobin 13.5 13.0 - 17.7 g/dL   Hematocrit 38.9 37.5 - 51.0 %   MCV 93 79 - 97 fL   MCH 32.2 26.6 - 33.0 pg   MCHC 34.7 31.5 - 35.7 g/dL   RDW 13.5 12.3 - 15.4 %   Platelets 268 150 - 379 x10E3/uL   Neutrophils 52 Not Estab. %   Lymphs 30 Not Estab. %   Monocytes 7 Not Estab. %   Eos 2 Not Estab. %   Basos 0 Not Estab. %   Immature Cells Note    Neutrophils Absolute 3.5 1.4 - 7.0 x10E3/uL   Lymphocytes Absolute 2.0 0.7 - 3.1 x10E3/uL   Monocytes Absolute 0.5 0.1 - 0.9 x10E3/uL   EOS (ABSOLUTE) 0.1 0.0 - 0.4 x10E3/uL   Basophils Absolute 0.0 0.0 - 0.2 x10E3/uL   Hematology Comments: Note:     Comment: Observations confirmed by smear review. (Performed by [facility] representative  Tech) Manual differential was performed.   Hemoglobin A1C     Status: Abnormal   Collection Time: 05/28/17  9:11 AM  Result Value Ref Range   Hgb A1c MFr Bld 6.5 (H) 4.8 - 5.6 %    Comment:          Pre-diabetes: 5.7 - 6.4          Diabetes: >6.4          Glycemic control for adults with diabetes: <7.0    Est. average glucose Bld gHb Est-mCnc 140 mg/dL  Immature Cells     Status: Abnormal   Collection Time: 05/28/17  9:11 AM  Result Value Ref Range   Bands(Auto) Relative 2 Not Estab. %   Metamyelocytes 4 (H) 0 - 0 %   MYELOCYTES 3 (H) 0 - 0 %   No results found.  Review of Systems  Constitutional: Negative.  Negative for chills and fever.  HENT: Negative.   Eyes: Negative.   Cardiovascular: Negative.   Gastrointestinal: Negative.   Genitourinary: Positive for flank pain, frequency and urgency.  Skin: Negative.   Neurological: Negative.    Endo/Heme/Allergies: Negative.     There were no vitals taken for this visit. Physical Exam  Constitutional: He appears well-developed.  HENT:  Head: Normocephalic.  Eyes: Pupils are equal, round, and reactive to light.  Neck: Normal range of motion.  Cardiovascular: Normal rate.   Respiratory: Effort normal.  GI: Soft.  Genitourinary:  Genitourinary Comments: Mild Rt CVAT at present.   Musculoskeletal: Normal range of motion.  Neurological: He is alert.  Skin: Skin is warm.  Psychiatric: He has a normal mood and affect.     Assessment/Plan  Proceed as planned with RIGHT ureteroscopic stone manipulation with goal of right side stone free. Risks, benefits, alternatives, expected peri-op course discussed previously and reiterated today.   [name], MD 05/29/2017, 7:04 AM

## 2017-05-29 NOTE — Anesthesia Procedure Notes (Signed)
Procedure Name: LMA Insertion Date/Time: 05/29/2017 4:20 PM Performed by: Montel Clock Pre-anesthesia Checklist: Patient identified, Emergency Drugs available, Suction available, Patient being monitored and Timeout performed Patient Re-evaluated:Patient Re-evaluated prior to induction Oxygen Delivery Method: Circle system utilized Preoxygenation: Pre-oxygenation with 100% oxygen Induction Type: IV induction Ventilation: Mask ventilation without difficulty LMA: LMA with gastric port inserted LMA Size: 5.0 Number of attempts: 1 Dental Injury: Teeth and Oropharynx as per pre-operative assessment

## 2017-05-29 NOTE — Brief Op Note (Signed)
05/29/2017  4:45 PM  PATIENT:  Jimmy Perez.  51 y.o. male  PRE-OPERATIVE DIAGNOSIS:  RIGHT URETERAL STONE  POST-OPERATIVE DIAGNOSIS:  RIGHT URETERAL STONE  PROCEDURE:  Procedure(s): CYSTOSCOPY WITH RETROGRADE PYELOGRAM, URETEROSCOPY AND STENT PLACEMENT (Right) HOLMIUM LASER APPLICATION (Right)  SURGEON:  Surgeon(s) and Role:    * [name], MD - Primary  PHYSICIAN ASSISTANT:   ASSISTANTS: none   ANESTHESIA:   general  EBL:  Total I/O In: 1000 [I.V.:1000] Out: -   BLOOD ADMINISTERED:none  DRAINS: none   LOCAL MEDICATIONS USED:  NONE  SPECIMEN:  Source of Specimen:  Rt ureteral stone fragments  DISPOSITION OF SPECIMEN:  [facility] for compositional analysis  COUNTS:  YES  TOURNIQUET:  * No tourniquets in log *  DICTATION: .Other Dictation: Dictation Number [id number]  PLAN OF CARE: Discharge to home after PACU  PATIENT DISPOSITION:  PACU - hemodynamically stable.   Delay start of Pharmacological VTE agent (>24hrs) due to surgical blood loss or risk of bleeding: yes

## 2017-05-29 NOTE — Anesthesia Postprocedure Evaluation (Signed)
Anesthesia Post Note  Patient: Jimmy Perez.  Procedure(s) Performed: Procedure(s) (LRB): CYSTOSCOPY WITH RETROGRADE PYELOGRAM, URETEROSCOPY AND STENT PLACEMENT (Right) HOLMIUM LASER APPLICATION (Right)     Patient location during evaluation: PACU Anesthesia Type: General Level of consciousness: awake and alert Pain management: pain level controlled Vital Signs Assessment: post-procedure vital signs reviewed and stable Respiratory status: spontaneous breathing, nonlabored ventilation, respiratory function stable and patient connected to nasal cannula oxygen Cardiovascular status: blood pressure returned to baseline and stable Postop Assessment: no signs of nausea or vomiting Anesthetic complications: no    Last Vitals:  Vitals:   05/29/17 1700 05/29/17 1715  BP: 128/65 (!) 144/79  Pulse: 70 72  Resp: (!) 27 (!) 22  Temp:      Last Pain:  Vitals:   05/29/17 1330  TempSrc: Oral                 Pang Robers S

## 2017-05-30 ENCOUNTER — Encounter (HOSPITAL_COMMUNITY): Payer: Self-pay | Admitting: Urology

## 2017-06-01 ENCOUNTER — Encounter (HOSPITAL_COMMUNITY): Payer: Self-pay | Admitting: Urology

## 2017-06-01 NOTE — Op Note (Signed)
NAMEARLESTER, KEEHAN NO.:  [id number]  MEDICAL RECORD NO.:  [id number]  LOCATION:                                 FACILITY:  PHYSICIAN:  [name], MD          DATE OF BIRTH:  DATE OF PROCEDURE:  05/29/2017                              OPERATIVE REPORT   PREOPERATIVE DIAGNOSIS:  Right distal ureteral stone with refractory colic.  POSTOPERATIVE DIAGNOSIS:  Right distal ureteral stone with refractory colic.  PROCEDURES: 1. Cystoscopy with right retrograde pyelogram and interpretation. 2. Right ureteroscopy with laser lithotripsy. 3. Insertion of right ureteral stent, 5 x 26 Polaris with tether.  ESTIMATED BLOOD LOSS:  Nil.  COMPLICATIONS:  None.  SPECIMENS:  Right ureteral stone fragments for compositional analysis.  FINDINGS: 1. Mild hydroureteronephrosis. 2. Filling defect in the right distal ureter consistent with known     stone by retrograde pyelogram. 3. Distal right ureteral stone as expected. 4. Complete resolution of all stone fragments larger than 1/3rd mm     following laser lithotripsy and basket extraction in the right     ureter. 5. Successful placement of right ureteral stent, proximal in upper     pole, distal in urinary bladder.  INDICATION:  Mr. Poorman is a very pleasant 51 year old gentleman, who was found on workup of colicky flank pain to have a right distal ureteral stone approximately 7 mm.  Options were discussed for management including medical therapy versus shockwave lithotripsy versus ureteroscopy.  He wished to proceed with ureteroscopy after a brief trial of medical therapy.  He unfortunately failed to pass the stone and presents for definitive management with ureteroscopy today.  Informed consent was obtained and placed in the medical record.  DESCRIPTION OF PROCEDURE:  The patient being, Chigozie Basaldua verified, procedure being right ureteroscopic stone manipulation was confirmed. Procedure was carried out.  Time-out  was performed.  Intravenous antibiotics administered.  General anesthesia introduced.  The patient placed into a low lithotomy position.  Sterile field was created by prepping and draping the patient's penis, perineum, proximal thighs using iodine.  Cystourethroscopy was performed using a 22-French rigid cystoscope with offset lens.  Inspection of the anterior and posterior urethra were unremarkable.  Inspection of bladder revealed no diverticula, calcifications, papillary lesions.  There was some mild edema noted in what appeared to be the right intramural ureter consistent likely distal stone.  The right ureteral orifice was cannulated with a 6-French end-hole catheter and right retrograde pyelogram was obtained.  Right retrograde pyelogram demonstrated a single right ureter with single-system right kidney.  There were a filling defect and calcification within the right distal ureter consistent with known stone.  There was mild hydroureteronephrosis above this.  A 0.038 ZIPwire was advanced at the level of the upper pole, set aside as a safety wire.  An 8-French feeding tube placed in urinary bladder for pressure release.  Next, semi-rigid ureteroscopy was performed in the distal right ureter alongside a separate Sensor working wire.  The stone in question was encountered in the distal ureter, appeared to be much too large for simple basketing.  As such, holmium laser energy applied to  the stone using settings of 0.3 joules and 30 Hz.  It was fragmented approximately 3 smaller pieces.  These were then grasped sequentially, and brought to the level of the urinary bladder on their long axis. Semi-rigid ureteroscopy the entire length of the ureter revealed no additional stone fragments.  Given the impacted nature of the stone, it was felt that brief interval stenting would be warranted.  The cystoscope was used to irrigate the fragments from the bladder, which was set aside for  compositional analysis.  Right ureteral stent was placed over the remaining safety wire using cystoscopic and fluoroscopic guidance.  Good proximal and distal deployment were noted.  Bladder was emptied per cystoscope and tether was fashioned to the dorsum of the thigh and the procedure was terminated.  The patient tolerated the procedure well.  There were no immediate periprocedural complications. The patient was taken to the Postanesthesia Care Unit in stable condition.    ______________________________ [name], MD   ______________________________ [name], MD    TM/MEDQ  D:  05/29/2017  T:  05/29/2017  Job:  [id number]

## 2017-06-03 ENCOUNTER — Ambulatory Visit: Payer: 59 | Admitting: Family Medicine

## 2017-06-24 ENCOUNTER — Encounter: Payer: Self-pay | Admitting: *Deleted

## 2017-06-24 ENCOUNTER — Encounter: Payer: Self-pay | Admitting: Family Medicine

## 2017-06-24 ENCOUNTER — Ambulatory Visit (INDEPENDENT_AMBULATORY_CARE_PROVIDER_SITE_OTHER): Payer: 59 | Admitting: Family Medicine

## 2017-06-24 VITALS — BP 122/74 | HR 79 | Temp 99.1°F | Ht 67.0 in | Wt 235.0 lb

## 2017-06-24 DIAGNOSIS — I129 Hypertensive chronic kidney disease with stage 1 through stage 4 chronic kidney disease, or unspecified chronic kidney disease: Secondary | ICD-10-CM

## 2017-06-24 DIAGNOSIS — M5441 Lumbago with sciatica, right side: Secondary | ICD-10-CM

## 2017-06-24 DIAGNOSIS — E1169 Type 2 diabetes mellitus with other specified complication: Secondary | ICD-10-CM | POA: Diagnosis not present

## 2017-06-24 DIAGNOSIS — Z789 Other specified health status: Secondary | ICD-10-CM

## 2017-06-24 DIAGNOSIS — N400 Enlarged prostate without lower urinary tract symptoms: Secondary | ICD-10-CM | POA: Diagnosis not present

## 2017-06-24 DIAGNOSIS — E785 Hyperlipidemia, unspecified: Secondary | ICD-10-CM

## 2017-06-24 DIAGNOSIS — K219 Gastro-esophageal reflux disease without esophagitis: Secondary | ICD-10-CM

## 2017-06-24 DIAGNOSIS — I10 Essential (primary) hypertension: Secondary | ICD-10-CM

## 2017-06-24 DIAGNOSIS — E1122 Type 2 diabetes mellitus with diabetic chronic kidney disease: Secondary | ICD-10-CM

## 2017-06-24 DIAGNOSIS — E559 Vitamin D deficiency, unspecified: Secondary | ICD-10-CM | POA: Diagnosis not present

## 2017-06-24 DIAGNOSIS — R1032 Left lower quadrant pain: Secondary | ICD-10-CM

## 2017-06-24 DIAGNOSIS — M2550 Pain in unspecified joint: Secondary | ICD-10-CM | POA: Diagnosis not present

## 2017-06-24 DIAGNOSIS — N200 Calculus of kidney: Secondary | ICD-10-CM | POA: Diagnosis not present

## 2017-06-24 DIAGNOSIS — N181 Chronic kidney disease, stage 1: Secondary | ICD-10-CM

## 2017-06-24 MED ORDER — CYCLOBENZAPRINE HCL 10 MG PO TABS: 10.0000 mg | ORAL_TABLET | Freq: Three times a day (TID) | ORAL | 1 refills | Status: DC | PRN

## 2017-06-24 MED ORDER — AMLODIPINE BESYLATE 5 MG PO TABS: ORAL_TABLET | ORAL | 3 refills | Status: DC

## 2017-06-24 MED ORDER — ROSUVASTATIN CALCIUM 5 MG PO TABS: 5.0000 mg | ORAL_TABLET | Freq: Every day | ORAL | 3 refills | Status: DC

## 2017-06-24 MED ORDER — VITAMIN D (ERGOCALCIFEROL) 1.25 MG (50000 UNIT) PO CAPS: 50000.0000 [IU] | ORAL_CAPSULE | ORAL | 3 refills | Status: DC

## 2017-06-24 MED ORDER — METFORMIN HCL 500 MG PO TABS: 500.0000 mg | ORAL_TABLET | Freq: Every day | ORAL | 3 refills | Status: DC

## 2017-06-24 MED ORDER — LOSARTAN POTASSIUM 50 MG PO TABS: 50.0000 mg | ORAL_TABLET | Freq: Every day | ORAL | 3 refills | Status: DC

## 2017-06-24 MED ORDER — OMEPRAZOLE 40 MG PO CPDR: 40.0000 mg | DELAYED_RELEASE_CAPSULE | Freq: Every day | ORAL | 3 refills | Status: DC

## 2017-06-24 NOTE — Patient Instructions (Addendum)
Continue current medications. Continue good therapeutic lifestyle changes which include good diet and exercise. Fall precautions discussed with patient. If an FOBT was given today- please return it to our front desk. If you are over 50 years old - you may need Prevnar 43 or the adult Pneumonia vaccine.  **Flu shots are available--- please call and schedule a FLU-CLINIC appointment**  After your visit with [place] today you will receive a survey in the mail or online from [employer] regarding your care with [place]. Please take a moment to fill this out. Your feedback is very important to [place] as you can help [place] better understand your patient needs as well as improve your experience and satisfaction. WE CARE ABOUT YOU!!!   Get CK today Hold Crestor completely and repeat lipid/liver panel in 4 weeks fasting When the patient comes back in for his lipid liver panel make sure that he gets a weight Follow-up with urologist as planned--he should give your diet sheet based on what kind of stone you had removed. Try to find out what kind of stone that you have had to see if dietary modification can keep you from having further stones Continue to drink plenty of fluids and stay well hydrated Use warm wet compresses to left groin area 20 minutes 3 or 4 times daily with good range of motion activity to see if this will help resolve the discomfort without taking any anti-inflammatory medicines Make every effort with diet and exercise to lose weight

## 2017-06-24 NOTE — Progress Notes (Signed)
Subjective:    Patient ID: Jimmy Loretha Stapler., male    DOB: February 21, 1966, 51 y.o.   MRN: [id number]  HPI Pt here for follow up and management of chronic medical problems which includes diabetes, hyperlipidemia and hypertension. He is taking medication regularly.The patient has recently had a bout with right lower quadrant abdominal pain which turned out to be a kidney stone which subsequently had to be removed with cystoscopy. The patient saw Dr. [name] for this. He is also had problems with some joint pain and he had a cut back his Crestor to Monday Wednesday and Friday. He's had some left groin pain at times also. He will be given an FOBT to return. His last hemoglobin A1c was good at 6.5%. He is requesting a refill on his Flexeril. His weight is up 9 pounds unfortunately. His other vital signs are stable. The patient does have hyperlipidemia and hypertension and the last hemoglobin A1c being 6.5% he is also now diabetic. The patient does describe this left groin pain at times and says it started about the time he had his kidney stone. He doesn't recall any injury and the pain is only minor and not severe and not there all the time. He has cut back on the Crestor but will probably didn't have him leave this off completely since he also had a reaction to atorvastatin in the past. He denies any chest pain or shortness of breath. He denies any trouble currently with his stomach including nausea vomiting diarrhea blood in the stool or black tarry bowel movements. The omeprazole he is taking helps his heartburn. He denies any problems currently with passing his water although he was told he has 1 more kidney stone on the left and it is very small. He is not sure what type of kidney stone he had. His weight is up 9 pounds as discussed and we discussed dust with him that that puts him in the morbidly obese category.    Patient Active Problem List   Diagnosis Date Noted  . Abnormal CT scan, stomach     . Low back pain 12/06/2014  . Obstructive sleep apnea 11/06/2014  . Metabolic syndrome 83/41/9622  . HTN (hypertension) 04/18/2013  . Hyperlipidemia 04/18/2013  . GERD (gastroesophageal reflux disease) 04/18/2013  . Obesity 04/18/2013   Outpatient Encounter Prescriptions as of 06/24/2017  Medication Sig  . amLODipine (NORVASC) 5 MG tablet Take 1 tablet by mouth  daily  . ketorolac (TORADOL) 10 MG tablet Take 1 tablet (10 mg total) by mouth every 6 (six) hours as needed. For mild pain / stent discomfort post-op. Pt has tolerated prior.  Marland Kitchen losartan (COZAAR) 50 MG tablet Take 1 tablet (50 mg total) by mouth daily.  . Menthol, Topical Analgesic, (ICY HOT EX) Apply 1 application topically daily.  . metFORMIN (GLUCOPHAGE) 500 MG tablet Take 1 tablet (500 mg total) by mouth daily with breakfast.  . Omega-3 Fatty Acids (FISH OIL) 1000 MG CAPS Take 1,000 mg by mouth daily.   Marland Kitchen omeprazole (PRILOSEC) 40 MG capsule Take 1 capsule (40 mg total) by mouth daily.  . rosuvastatin (CRESTOR) 5 MG tablet Take 1 tablet (5 mg total) by mouth daily.  Marland Kitchen senna-docusate (SENOKOT-S) 8.6-50 MG tablet Take 1 tablet by mouth 2 (two) times daily. While taking strongest pain meds to prevent constipation.  . Vitamin D, Ergocalciferol, (DRISDOL) 50000 units CAPS capsule Take 1 capsule (50,000 Units total) by mouth every 7 (seven) days. (Patient taking differently: Take  50,000 Units by mouth every Monday. )  . colchicine 0.6 MG tablet Take 1 tablet (0.6 mg total) by mouth daily. May repeat in 2 hours x1 in 24hours (Patient not taking: Reported on 06/24/2017)  . cyclobenzaprine (FLEXERIL) 10 MG tablet Take 1 tablet (10 mg total) by mouth 3 (three) times daily as needed for muscle spasms. (Patient not taking: Reported on 06/24/2017)  . ondansetron (ZOFRAN ODT) 4 MG disintegrating tablet Take 1 tablet (4 mg total) by mouth every 8 (eight) hours as needed for nausea or vomiting. (Patient not taking: Reported on 06/24/2017)  .  oxyCODONE-acetaminophen (ROXICET) 5-325 MG tablet Take 1-2 tablets by mouth every 6 (six) hours as needed for severe pain. Post-operatively. (Patient not taking: Reported on 06/24/2017)  . [DISCONTINUED] cephALEXin (KEFLEX) 500 MG capsule Take 1 capsule (500 mg total) by mouth 2 (two) times daily. X 3 days to prevent infection with stent in place.  . [DISCONTINUED] doxycycline (VIBRAMYCIN) 100 MG capsule Take 1 capsule (100 mg total) by mouth 2 (two) times daily.   No facility-administered encounter medications on file as of 06/24/2017.       Review of Systems  Constitutional: Negative.   HENT: Negative.   Eyes: Negative.   Respiratory: Negative.   Cardiovascular: Negative.   Gastrointestinal: Negative.   Endocrine: Negative.   Genitourinary: Negative.        Groin pain left side at times  Musculoskeletal: Positive for arthralgias (joint pain).  Skin: Negative.   Allergic/Immunologic: Negative.   Neurological: Negative.   Hematological: Negative.   Psychiatric/Behavioral: Negative.        Objective:   Physical Exam  Constitutional: He is oriented to person, place, and time. He appears well-developed and well-nourished. No distress.  The patient is pleasant and relaxed and comes to the visit today with his wife  HENT:  Head: Normocephalic and atraumatic.  Right Ear: External ear normal.  Left Ear: External ear normal.  Nose: Nose normal.  Mouth/Throat: Oropharynx is clear and moist. No oropharyngeal exudate.  Eyes: Pupils are equal, round, and reactive to light. Conjunctivae and EOM are normal. Right eye exhibits no discharge. Left eye exhibits no discharge. No scleral icterus.  Neck: Normal range of motion. Neck supple. No thyromegaly present.  No bruits thyromegaly or anterior cervical adenopathy  Cardiovascular: Normal rate, regular rhythm, normal heart sounds and intact distal pulses.   No murmur heard. The heart is regular at 72/m  Pulmonary/Chest: Effort normal and  breath sounds normal. No respiratory distress. He has no wheezes. He has no rales. He exhibits no tenderness.  Clear anteriorly and posteriorly no axillary adenopathy  Abdominal: Soft. Bowel sounds are normal. He exhibits no mass. There is no tenderness. There is no rebound and no guarding.  Abdominal obesity without masses tenderness or organ enlargement or bruits  Musculoskeletal: Normal range of motion. He exhibits no edema.  Minimal left groin strain slight tenderness with abduction of left hip in groin medially  Lymphadenopathy:    He has no cervical adenopathy.  Neurological: He is alert and oriented to person, place, and time. He has normal reflexes. No cranial nerve deficit.  Skin: Skin is warm and dry. No rash noted.  Psychiatric: He has a normal mood and affect. His behavior is normal. Judgment and thought content normal.  Nursing note and vitals reviewed.  BP 122/74 (BP Location: Left Arm)   Pulse 79   Temp 99.1 F (37.3 C) (Oral)   Ht 5\' 7"  (1.702 m)   Wt  235 lb (106.6 kg)   BMI 36.81 kg/m         Assessment & Plan:  1. Essential hypertension -Blood pressure is good today and he will continue with current treatment  2. Gastroesophageal reflux disease, esophagitis presence not specified -Reflux is well controlled with omeprazole  3. 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  4. Vitamin D deficiency -Continue current treatment pending results of lab work - VITAMIN D 25 Hydroxy (Vit-D Deficiency, Fractures); Future  5. Benign prostatic hyperplasia, unspecified whether lower urinary tract symptoms present -No symptoms with voiding.  6. Hyperlipidemia associated with type 2 diabetes mellitus ([place]) -The patient has been statin intolerant to 2 different statins. Atorvastatin and now  possibly Crestor. We'll getting a CK today and liver function tests. He will hold his Crestor and we will check a lipid liver panel in about 4 weeks. - Lipid panel; Future - Hepatic function panel  7. Acute right-sided low back pain with right-sided sciatica - cyclobenzaprine (FLEXERIL) 10 MG tablet; Take 1 tablet (10 mg total) by mouth 3 (three) times daily as needed for muscle spasms.  Dispense: 30 tablet; Refill: 1  8. Arthralgia, unspecified joint -This could be statin related and we will check CK and LFTs - CK  9. Nephrolithiasis -Follow-up with urology as planned and identifies stone type  10. Morbid obesity ([place]) -Make every effort through diet and exercise to lose weight. This is critical, this was emphasized to the patient several times during the visit.  11. Left groin pain -Warm wet compresses 20 minutes 3 or 4 times daily  12. Statin intolerance -Hold Crestor and make sure that arthralgias and myalgias diminished. Get lipid liver panel in 4 weeks fasting - Hepatic function panel - CK  Meds ordered this encounter  Medications  . cyclobenzaprine (FLEXERIL) 10 MG tablet    Sig: Take 1 tablet (10 mg total) by mouth 3 (three) times daily as needed for muscle spasms.    Dispense:  30 tablet    Refill:  1  . Vitamin D, Ergocalciferol, (DRISDOL) 50000 units CAPS capsule    Sig: Take 1 capsule (50,000 Units total) by mouth every 7 (seven) days.    Dispense:  12 capsule    Refill:  3  . rosuvastatin (CRESTOR) 5 MG tablet    Sig: Take 1 tablet (5 mg total) by mouth daily.    Dispense:  90 tablet    Refill:  3  . omeprazole (PRILOSEC) 40 MG capsule    Sig: Take 1 capsule (40 mg total) by mouth daily.    Dispense:  90 capsule    Refill:  3  . metFORMIN (GLUCOPHAGE) 500 MG tablet    Sig: Take 1 tablet (500 mg total) by mouth daily with breakfast.    Dispense:  90 tablet    Refill:  3  . losartan (COZAAR) 50 MG tablet    Sig: Take 1 tablet (50 mg total) by mouth daily.     Dispense:  90 tablet    Refill:  3  . amLODipine (NORVASC) 5 MG tablet    Sig: Take 1 tablet by mouth  daily    Dispense:  90 tablet    Refill:  3   Patient Instructions  Continue current medications. Continue good therapeutic lifestyle changes which include good diet and exercise. Fall  precautions discussed with patient. If an FOBT was given today- please return it to our front desk. If you are over 20 years old - you may need Prevnar 69 or the adult Pneumonia vaccine.  **Flu shots are available--- please call and schedule a FLU-CLINIC appointment**  After your visit with [place] today you will receive a survey in the mail or online from [employer] regarding your care with [place]. Please take a moment to fill this out. Your feedback is very important to [place] as you can help [place] better understand your patient needs as well as improve your experience and satisfaction. WE CARE ABOUT YOU!!!   Get CK today Hold Crestor completely and repeat lipid/liver panel in 4 weeks fasting When the patient comes back in for his lipid liver panel make sure that he gets a weight Follow-up with urologist as planned--he should give your diet sheet based on what kind of stone you had removed. Try to find out what kind of stone that you have had to see if dietary modification can keep you from having further stones Continue to drink plenty of fluids and stay well hydrated Use warm wet compresses to left groin area 20 minutes 3 or 4 times daily with good range of motion activity to see if this will help resolve the discomfort without taking any anti-inflammatory medicines Make every effort with diet and exercise to lose weight  Jimmy Senate MD

## 2017-06-25 LAB — HEPATIC FUNCTION PANEL
ALT: 24 [IU]/L (ref 0–44)
AST: 16 [IU]/L (ref 0–40)
Albumin: 5.1 g/dL (ref 3.5–5.5)
Alkaline Phosphatase: 86 [IU]/L (ref 39–117)
Bilirubin Total: 0.5 mg/dL (ref 0.0–1.2)
Bilirubin, Direct: 0.15 mg/dL (ref 0.00–0.40)
Total Protein: 7.3 g/dL (ref 6.0–8.5)

## 2017-06-25 LAB — CK: Total CK: 185 U/L (ref 24–204)

## 2017-07-02 ENCOUNTER — Telehealth: Payer: Self-pay | Admitting: *Deleted

## 2017-07-02 NOTE — Telephone Encounter (Signed)
Dr Laurance Flatten,  I told wife to take home a Urine cup and stool cultures and bring those back tomorrow.  Any other suggestions?

## 2017-07-02 NOTE — Telephone Encounter (Signed)
Try Lomotil if clear liquids avoidance of caffeine and milk cheese and ice cream and dairy products don't help. Give the diet as she has to work for another day and if that doesn't work try Lomotil instead and wait till cultures and sensitivities are returned and the results of the stool tests for C. difficile.

## 2017-07-03 ENCOUNTER — Other Ambulatory Visit: Payer: Self-pay | Admitting: *Deleted

## 2017-07-03 ENCOUNTER — Other Ambulatory Visit: Payer: 59

## 2017-07-03 DIAGNOSIS — R197 Diarrhea, unspecified: Secondary | ICD-10-CM

## 2017-07-03 LAB — URINALYSIS, COMPLETE
Bilirubin, UA: NEGATIVE
Glucose, UA: NEGATIVE
KETONES UA: NEGATIVE
LEUKOCYTES UA: NEGATIVE
Nitrite, UA: NEGATIVE
Protein, UA: NEGATIVE
SPEC GRAV UA: 1.02 (ref 1.005–1.030)
Urobilinogen, Ur: 0.2 mg/dL (ref 0.2–1.0)
pH, UA: 6.5 (ref 5.0–7.5)

## 2017-07-03 LAB — MICROSCOPIC EXAMINATION
EPITHELIAL CELLS (NON RENAL): NONE SEEN /HPF (ref 0–10)
RENAL EPITHEL UA: NONE SEEN /HPF

## 2017-07-03 MED ORDER — DIPHENOXYLATE-ATROPINE 2.5-0.025 MG PO TABS: 1.0000 | ORAL_TABLET | Freq: Four times a day (QID) | ORAL | 0 refills | Status: DC | PRN

## 2017-07-03 NOTE — Telephone Encounter (Signed)
Pt wife aware yesterday evening

## 2017-07-05 LAB — URINE CULTURE

## 2017-07-08 LAB — STOOL CULTURE: E coli, Shiga toxin Assay: NEGATIVE

## 2017-07-08 LAB — CLOSTRIDIUM DIFFICILE BY PCR: CDIFFPCR: NEGATIVE

## 2017-07-09 MED ORDER — AZITHROMYCIN 500 MG PO TABS: 500.0000 mg | ORAL_TABLET | Freq: Every day | ORAL | 0 refills | Status: DC

## 2017-08-21 ENCOUNTER — Other Ambulatory Visit: Payer: Self-pay | Admitting: *Deleted

## 2017-08-21 MED ORDER — LOSARTAN POTASSIUM 50 MG PO TABS: 50.0000 mg | ORAL_TABLET | Freq: Every day | ORAL | 0 refills | Status: DC

## 2017-09-02 ENCOUNTER — Other Ambulatory Visit: Payer: Self-pay

## 2017-09-02 MED ORDER — DIPHENOXYLATE-ATROPINE 2.5-0.025 MG PO TABS: 1.0000 | ORAL_TABLET | Freq: Four times a day (QID) | ORAL | 2 refills | Status: DC | PRN

## 2017-09-03 ENCOUNTER — Ambulatory Visit (INDEPENDENT_AMBULATORY_CARE_PROVIDER_SITE_OTHER): Payer: 59

## 2017-09-03 DIAGNOSIS — Z23 Encounter for immunization: Secondary | ICD-10-CM

## 2017-11-11 ENCOUNTER — Ambulatory Visit: Payer: 59 | Admitting: Family Medicine

## 2017-11-16 ENCOUNTER — Encounter: Payer: Self-pay | Admitting: Nurse Practitioner

## 2017-11-16 ENCOUNTER — Ambulatory Visit: Payer: 59 | Admitting: Nurse Practitioner

## 2017-11-16 VITALS — BP 147/96 | HR 73 | Temp 97.5°F | Ht 67.0 in | Wt 248.8 lb

## 2017-11-16 DIAGNOSIS — R05 Cough: Secondary | ICD-10-CM | POA: Diagnosis not present

## 2017-11-16 DIAGNOSIS — R059 Cough, unspecified: Secondary | ICD-10-CM

## 2017-11-16 NOTE — Progress Notes (Signed)
   Subjective:    Patient ID: Jimmy Loretha Stapler., male    DOB: Apr 07, 1966, 52 y.o.   MRN: [id number]  HPI  Patient come sin today accompanied by his wife who is a Marine scientist at our office. Patient is c/o cough and congestion . Has trouble breathing outside.    Review of Systems  Constitutional: Negative for chills and fever.  HENT: Positive for congestion. Negative for sinus pressure and sinus pain.   Respiratory: Positive for cough.   Gastrointestinal: Negative.   Genitourinary: Negative.   Neurological: Negative.   Psychiatric/Behavioral: Negative.   All other systems reviewed and are negative.      Objective:   Physical Exam  Constitutional: He appears well-developed and well-nourished. No distress.  HENT:  Right Ear: Hearing, tympanic membrane, external ear and ear canal normal.  Left Ear: Hearing, tympanic membrane, external ear and ear canal normal.  Nose: Mucosal edema and rhinorrhea present.  Mouth/Throat: Uvula is midline, oropharynx is clear and moist and mucous membranes are normal.  Cardiovascular: Normal rate and regular rhythm.  Pulmonary/Chest: Effort normal. No respiratory distress. He has no wheezes. He has no rales. He exhibits no tenderness.  Neurological: He is alert.  Skin: Skin is warm.  Psychiatric: He has a normal mood and affect. His behavior is normal. Judgment and thought content normal.   BP (!) 147/96   Pulse 73   Temp (!) 97.5 F (36.4 C) (Oral)   Ht 5\' 7"  (1.702 m)   Wt 248 lb 12.8 oz (112.9 kg)   BMI 38.97 kg/m      Assessment & Plan:   1. Cough    symbicort 2 puffs bid Avoid cold air RTO prn  Mary-Margaret Hassell Done, FNP

## 2017-11-28 ENCOUNTER — Ambulatory Visit (INDEPENDENT_AMBULATORY_CARE_PROVIDER_SITE_OTHER): Payer: 59 | Admitting: Family Medicine

## 2017-11-28 VITALS — BP 138/86 | HR 76 | Temp 97.1°F | Ht 67.0 in | Wt 246.0 lb

## 2017-11-28 DIAGNOSIS — R079 Chest pain, unspecified: Secondary | ICD-10-CM | POA: Diagnosis not present

## 2017-11-28 MED ORDER — METOPROLOL SUCCINATE ER 25 MG PO TB24: 25.0000 mg | ORAL_TABLET | Freq: Every day | ORAL | 2 refills | Status: DC

## 2017-11-28 MED ORDER — AMOXICILLIN-POT CLAVULANATE 875-125 MG PO TABS: 1.0000 | ORAL_TABLET | Freq: Two times a day (BID) | ORAL | 0 refills | Status: DC

## 2017-11-28 NOTE — Progress Notes (Signed)
   HPI  Patient presents today chest discomfort.  Patient explains he has had an illness for about 2 weeks, he states that he has had cough, mild shortness of breath, and chest discomfort.  He states that he has had mainly bilateral shoulder pain and arm pain with exertion.  He also has shortness of breath.  He states that it improves when he rests for 3 or 4 minutes.  He denies any history of heart disease. His brother had an MI at the age of 40. His father had an MI around age of 52. He is a non-smoker. He has not been able to tolerate statins  PMH: Smoking status noted ROS: Per HPI  Objective: BP 138/86   Pulse 76   Temp (!) 97.1 F (36.2 C) (Oral)   Ht 5\' 7"  (1.702 m)   Wt 246 lb (111.6 kg)   BMI 38.53 kg/m  Gen: NAD, alert, cooperative with exam HEENT: NCAT, EOMI, PERRL CV: RRR, good S1/S2, no murmur Chest wall- mild TTP along BL shoulders- similar to experienced pain Resp: CTABL, no wheezes, non-labored Ext: No edema, warm Neuro: Alert and oriented, No gross deficits  Assessment and plan:  # Chest pain Typical atypical features I am concerned given his elevated risk due to family history and exertional symptoms associated with shortness of breath. Previous lipid studies look good. The patient has been ill feeling lately, he has had improvement with Symbicort. Pain is reproduced on exam I will go ahead and treat him empirically for pneumonia, start a beta-blocker, and have him see cardiology ASAP.    Orders Placed This Encounter  Procedures  . Ambulatory referral to Cardiology    Referral Priority:   Routine    Referral Type:   Consultation    Referral Reason:   Specialty Services Required    Requested Specialty:   Cardiology    Number of Visits Requested:   1  . EKG 12-Lead    Meds ordered this encounter  Medications  . amoxicillin-clavulanate (AUGMENTIN) 875-125 MG tablet    Sig: Take 1 tablet by mouth 2 (two) times daily.    Dispense:  20 tablet      Refill:  0  . metoprolol succinate (TOPROL-XL) 25 MG 24 hr tablet    Sig: Take 1 tablet (25 mg total) by mouth daily.    Dispense:  30 tablet    Refill:  [place], MD [place] 11/28/2017, 11:37 AM

## 2017-11-28 NOTE — Patient Instructions (Signed)
Great to see you!   Nonspecific Chest Pain Chest pain can be caused by many different conditions. There is a chance that your pain could be related to something serious, such as a heart attack or a blood clot in your lungs. Chest pain can also be caused by conditions that are not life-threatening. If you have chest pain, it is very important to follow up with your doctor. Follow these instructions at home: Medicines  If you were prescribed an antibiotic medicine, take it as told by your doctor. Do not stop taking the antibiotic even if you start to feel better.  Take over-the-counter and prescription medicines only as told by your doctor. Lifestyle  Do not use any products that contain nicotine or tobacco, such as cigarettes and e-cigarettes. If you need help quitting, ask your doctor.  Do not drink alcohol.  Make lifestyle changes as told by your doctor. These may include: ? Getting regular exercise. Ask your doctor for some activities that are safe for you. ? Eating a heart-healthy diet. A diet specialist (dietitian) can help you to learn healthy eating options. ? Staying at a healthy weight. ? Managing diabetes, if needed. ? Lowering your stress, as with deep breathing or spending time in nature. General instructions  Avoid any activities that make you feel chest pain.  If your chest pain is because of heartburn: ? Raise (elevate) the head of your bed about 6 inches (15 cm). You can do this by putting blocks under the bed legs at the head of the bed. ? Do not sleep with extra pillows under your head. That does not help heartburn.  Keep all follow-up visits as told by your doctor. This is important. This includes any further testing if your chest pain does not go away. Contact a doctor if:  Your chest pain does not go away.  You have a rash with blisters on your chest.  You have a fever.  You have chills. Get help right away if:  Your chest pain is worse.  You have a  cough that gets worse, or you cough up blood.  You have very bad (severe) pain in your belly (abdomen).  You are very weak.  You pass out (faint).  You have either of these for no clear reason: ? Sudden chest discomfort. ? Sudden discomfort in your arms, back, neck, or jaw.  You have shortness of breath at any time.  You suddenly start to sweat, or your skin gets clammy.  You feel sick to your stomach (nauseous).  You throw up (vomit).  You suddenly feel light-headed or dizzy.  Your heart starts to beat fast, or it feels like it is skipping beats. These symptoms may be an emergency. Do not wait to see if the symptoms will go away. Get medical help right away. Call your local emergency services (911 in the [place] Do not drive yourself to the hospital. This information is not intended to replace advice given to you by your health care provider. Make sure you discuss any questions you have with your health care provider. Document Released: 03/31/2008 Document Revised: 07/07/2016 Document Reviewed: 07/07/2016 Elsevier Interactive Patient Education  2017 [employer].

## 2017-12-09 ENCOUNTER — Telehealth: Payer: Self-pay | Admitting: Cardiology

## 2017-12-09 ENCOUNTER — Ambulatory Visit: Payer: 59 | Admitting: Physician Assistant

## 2017-12-09 ENCOUNTER — Encounter: Payer: Self-pay | Admitting: Physician Assistant

## 2017-12-09 VITALS — BP 116/71 | HR 63 | Ht 67.5 in | Wt 244.2 lb

## 2017-12-09 DIAGNOSIS — I10 Essential (primary) hypertension: Secondary | ICD-10-CM

## 2017-12-09 DIAGNOSIS — G4733 Obstructive sleep apnea (adult) (pediatric): Secondary | ICD-10-CM | POA: Diagnosis not present

## 2017-12-09 DIAGNOSIS — Z01818 Encounter for other preprocedural examination: Secondary | ICD-10-CM

## 2017-12-09 DIAGNOSIS — Z9989 Dependence on other enabling machines and devices: Secondary | ICD-10-CM | POA: Diagnosis not present

## 2017-12-09 DIAGNOSIS — I209 Angina pectoris, unspecified: Secondary | ICD-10-CM | POA: Diagnosis not present

## 2017-12-09 DIAGNOSIS — E119 Type 2 diabetes mellitus without complications: Secondary | ICD-10-CM

## 2017-12-09 DIAGNOSIS — E785 Hyperlipidemia, unspecified: Secondary | ICD-10-CM | POA: Diagnosis not present

## 2017-12-09 MED ORDER — NITROGLYCERIN 0.4 MG SL SUBL: 0.4000 mg | SUBLINGUAL_TABLET | SUBLINGUAL | 1 refills | Status: DC | PRN

## 2017-12-09 NOTE — Progress Notes (Addendum)
Cardiology Office Note    Date:  12/11/2017   ID:  Jimmy Craft., DOB 04/07/1966, MRN [id number]  PCP:  [name], MD  Cardiologist:  New - Dr. Claiborne Billings  Chief Complaint  Patient presents with  . New Patient (Initial Visit)    referred by Dr. Wendi Snipes for evaluation of chest pain    History of Present Illness:  Jimmy Layn Kye. is a 52 y.o. male with PMH of HTN, HLD, OSA on CPAP, and diabetes. ETT on 10/04/2015 showed no ischemic changes, normal stress test.  His younger brother had first MI at age 38.  His father had 3 stents by age 59.  He never smoked himself, he drank about 5-6 beers on the weekend.  Otherwise he has been compliant with his CPAP machine.  For the past 3 weeks, he has been noticing exertional dyspnea.  He also had a several episode of chest pain lasting up to 20 minutes each.  They typically occur in the substernal area radiating to bilateral shoulder and the back.  His chest pain is more noticeable during exertion and improved with rest.  He denies any exacerbating factors such as deep inspiration, body rotational palpation.  He says only the last night's episode was atypical, it occurred roughly 30 minutes after he ate a hot dog.  Most of the time, his chest pain corresponds more so with exertion and relieved by rest.  I have discussed the case with DOD Dr. Claiborne Billings, his cardiac risk factors include hypertension, hyperlipidemia, diabetes, obstructive sleep apnea, family history and age.  Given his significant cardiac risk factors, we opted for diagnostic cardiac catheterization for definitive evaluation.  I tried to set him up for cardiac catheterization tomorrow by Dr. Claiborne Billings, unfortunately both tomorrow and Friday's schedule are completely full.  Dr. Claiborne Billings will be rounding the entire next week and will only be back in the Cath Lab on 28 February.  Given his a daily episode of chest pain, I do not think he should wait that long.  We have scheduled the patient for  the first available slot on Monday.  He will need to hold metformin for 24 hours before and 48 hours after the cardiac catheterization.  I have also discussed the benefit and risk of the procedure with the patient, he displayed clear understanding and agreed to proceed.    Past Medical History:  Diagnosis Date  . Borderline diabetes    no meds, watching diet.  . Chronic kidney disease   . Diabetes mellitus without complication ([place])   . GERD (gastroesophageal reflux disease)   . Hyperlipidemia   . Hypertension   . Kidney stones   . OSA (obstructive sleep apnea)    Cpap use--study 12-26-14 Epic suggestive of this.  . Pre-diabetes    "per patient"  . Vitamin D deficiency     Past Surgical History:  Procedure Laterality Date  . CYSTOSCOPY WITH RETROGRADE PYELOGRAM, URETEROSCOPY AND STENT PLACEMENT Right 05/29/2017   Procedure: CYSTOSCOPY WITH RETROGRADE PYELOGRAM, URETEROSCOPY AND STENT PLACEMENT;  Surgeon: [name], MD;  Location: [facility];  Service: Urology;  Laterality: Right;  . EUS N/A 02/08/2015   Procedure: UPPER ENDOSCOPIC ULTRASOUND (EUS) LINEAR;  Surgeon: [name], MD;  Location: [facility];  Service: Endoscopy;  Laterality: N/A;  . EUS N/A 09/25/2016   Procedure: UPPER ENDOSCOPIC ULTRASOUND (EUS) RADIAL;  Surgeon: [name], MD;  Location: [facility];  Service: Endoscopy;  Laterality: N/A;  . HOLMIUM LASER  APPLICATION Right 12/06/7987   Procedure: HOLMIUM LASER APPLICATION;  Surgeon: [name], MD;  Location: [facility];  Service: Urology;  Laterality: Right;  . KNEE ARTHROSCOPY Right 11/28/95   [name])    Current Medications: Outpatient Medications Prior to Visit  Medication Sig Dispense Refill  . amLODipine (NORVASC) 5 MG tablet Take 1 tablet by mouth  daily (Patient taking differently: Take 5 mg by mouth daily. ) 90 tablet 3  . colchicine 0.6 MG tablet Take 1 tablet (0.6 mg total) by mouth daily. May repeat in 2 hours x1 in 24hours (Patient  taking differently: Take 0.6 mg by mouth daily as needed (for gout). May repeat in 2 hours x1 in 24hours) 20 tablet 1  . cyclobenzaprine (FLEXERIL) 10 MG tablet Take 1 tablet (10 mg total) by mouth 3 (three) times daily as needed for muscle spasms. 30 tablet 1  . losartan (COZAAR) 50 MG tablet Take 1 tablet (50 mg total) by mouth daily. 90 tablet 0  . metFORMIN (GLUCOPHAGE) 500 MG tablet Take 1 tablet (500 mg total) by mouth daily with breakfast. 90 tablet 3  . metoprolol succinate (TOPROL-XL) 25 MG 24 hr tablet Take 1 tablet (25 mg total) by mouth daily. 30 tablet 2  . Omega-3 Fatty Acids (FISH OIL) 1000 MG CAPS Take 1,000 mg by mouth daily.     Marland Kitchen omeprazole (PRILOSEC) 40 MG capsule Take 1 capsule (40 mg total) by mouth daily. 90 capsule 3  . Vitamin D, Ergocalciferol, (DRISDOL) 50000 units CAPS capsule Take 1 capsule (50,000 Units total) by mouth every 7 (seven) days. (Patient taking differently: Take 50,000 Units by mouth every Monday. ) 12 capsule 3  . amoxicillin-clavulanate (AUGMENTIN) 875-125 MG tablet Take 1 tablet by mouth 2 (two) times daily. 20 tablet 0  . Menthol, Topical Analgesic, (ICY HOT EX) Apply 1 application topically daily.     No facility-administered medications prior to visit.      Allergies:   Patient has no known allergies.   Social History   Socioeconomic History  . Marital status: Married    Spouse name: Tye [place]   . Number of children: 2  . Years of education: None  . Highest education level: None  Social Needs  . Financial resource strain: None  . Food insecurity - worry: None  . Food insecurity - inability: None  . Transportation needs - medical: None  . Transportation needs - non-medical: None  Occupational History  . Occupation: Financial trader  Tobacco Use  . Smoking status: Never Smoker  . Smokeless tobacco: Current User    Types: Snuff  . Tobacco comment: off an on (pouches) of snuff  Substance and Sexual Activity  . Alcohol use: Yes     Alcohol/week: 3.6 oz    Types: 6 Cans of beer per week    Comment: occasional- depending on the occasion  . Drug use: No  . Sexual activity: None  Other Topics Concern  . None  Social History Narrative  . None     Family History:  The patient's family history includes Colon polyps in his brother; Diabetes in his father, mother, and sister; Heart attack (age of onset: 50) in his brother; Heart disease in his father; Hyperlipidemia in his brother and father; Hypertension in his brother, father, and sister.   ROS:   Please see the history of present illness.    ROS All other systems reviewed and are negative.   PHYSICAL EXAM:   VS:  BP 116/71  Pulse 63   Ht 5' 7.5" (1.715 m)   Wt 244 lb 3.2 oz (110.8 kg)   BMI 37.68 kg/m    GEN: Well nourished, well developed, in no acute distress  HEENT: normal  Neck: no JVD, carotid bruits, or masses Cardiac: RRR; no murmurs, rubs, or gallops,no edema  Respiratory:  clear to auscultation bilaterally, normal work of breathing GI: soft, nontender, nondistended, + BS MS: no deformity or atrophy  Skin: warm and dry, no rash Neuro:  Alert and Oriented x 3, Strength and sensation are intact Psych: euthymic mood, full affect  Wt Readings from Last 3 Encounters:  12/09/17 244 lb 3.2 oz (110.8 kg)  11/28/17 246 lb (111.6 kg)  11/16/17 248 lb 12.8 oz (112.9 kg)      Studies/Labs Reviewed:   EKG:  EKG is ordered today.  The ekg ordered today demonstrates NSR with TWI in inferior leads  Recent Labs: 06/24/2017: ALT 24 12/09/2017: BUN 13; Creatinine, Ser 0.98; Hemoglobin 15.3; Platelets 336; Potassium 5.1; Sodium 137   Lipid Panel    Component Value Date/Time   CHOL 178 01/27/2017 0800   TRIG 394 (H) 01/27/2017 0800   HDL 32 (L) 01/27/2017 0800   CHOLHDL 5.6 (H) 01/27/2017 0800   CHOLHDL 5.4 04/18/2013 0916   VLDL 76 (H) 04/18/2013 0916   LDLCALC 67 01/27/2017 0800   LDLDIRECT 88 06/06/2015 1527    Additional studies/ records that  were reviewed today include:   ETT 10/04/2015 Study Highlights    There was no ST segment deviation noted during stress.  no angina during the stress test  85% of maximum heart rate was achieved after 6.4 minutes  no ST segment deviation noted during stress  no significant arrhythmias noted during the test   Patient was able to achieve 85% maximal heart rate according to Bruce protocol. No symptoms of angina or shortness of breath were elicited. EKG showed no ST changes or other arrhythmias that would be indicative of coronary artery disease. Normal stress test.       ASSESSMENT:    1. Angina, class III ([place])   2. Pre-op testing   3. Essential hypertension   4. Hyperlipidemia, unspecified hyperlipidemia type   5. OSA on CPAP   6. Controlled type 2 diabetes mellitus without complication, without long-term current use of insulin (HCC)      PLAN:  In order of problems listed above:  1. Progressive class III angina: Patient is having progressive worsening chest pain with exertion.  We recommended cardiac catheterization.   - Risk and benefit of procedure explained to the patient who display clear understanding and agree to proceed.  Discussed with patient possible procedural risk include bleeding, vascular injury, renal injury, arrythmia, MI, stroke and loss of limb or life.  -Given nitroglycerin on hand and instructed the patient to seek medical attention if continued to have chest pain after the second nitroglycerin.  2. Hypertension: Blood pressure stable  3. Hyperlipidemia: We will need to consider addition of high-dose statin depending on catheter results.  4. DM 2: Hold metformin for 24 hours prior to cath in the 48 hours after cath.  5. Obstructive sleep apnea on CPAP: Compliant    Medication Adjustments/Labs and Tests Ordered: Current medicines are reviewed at length with the patient today.  Concerns regarding medicines are outlined above.  Medication changes,  Labs and Tests ordered today are listed in the Patient Instructions below. Patient Instructions    [place]  [place] [address] Dept: [phone] Loc: [place].  12/09/2017  You are scheduled for a Cardiac Catheterization on Monday February the 18th with Dr. Peter [name].  1. Please arrive at the [facility] (Main Entrance A) at [facility]: [address] [place], [place] [address] at 5:30 AM (two hours before your procedure to ensure your preparation). Free valet parking service is available.   Special note: Every effort is made to have your procedure done on time. Please understand that emergencies sometimes delay scheduled procedures.  2. Diet: Do not eat or drink anything after midnight prior to your procedure except sips of water to take medications.  3. Labs: Please have your labs drawn today at the [facility] office.  4. Medication instructions in preparation for your procedure:   Please hold the Metformin 24 hours prior to the procedure and then 48 hours after.     On the morning of your procedure, take your Aspirin 81 mg tablet and any morning medicines NOT listed above.  You may use sips of water.  5. Plan for one night stay--bring personal belongings. 6. Bring a current list of your medications and current insurance cards. 7. You MUST have a responsible person to drive you home. 8. Someone MUST be with you the first 24 hours after you arrive home or your discharge will be delayed. 9. Please wear clothes that are easy to get on and off and wear slip-on shoes.  Thank you for allowing [place] to care for you!   -- [facility]       Medication Instructions: Take Nitroglycerin as needed for chest pain. Place one tablet under the tongue every 5 minutes for chest pain (with a limit of three tablets  within a 15 minute time frame) If you need to take the third tablet, please call 911.   If you need a refill on your cardiac medications before your next appointment, please call your pharmacy.    Thank you for choosing [facility]care at [facility] [place]  12/11/2017 8:03 AM    [address] [place] [place], [place], [place]  [address] Phone: [phone]; Fax: [phone]

## 2017-12-09 NOTE — Telephone Encounter (Signed)
Left message to call back  

## 2017-12-09 NOTE — Patient Instructions (Signed)
   [place] [address] [place] [address] Dept: [phone] Loc: [place].  12/09/2017  You are scheduled for a Cardiac Catheterization on Monday February the 18th with Dr. Peter [name].  1. Please arrive at the [facility] (Main Entrance A) at [facility]: [address] [place], [place] [address] at 5:30 AM (two hours before your procedure to ensure your preparation). Free valet parking service is available.   Special note: Every effort is made to have your procedure done on time. Please understand that emergencies sometimes delay scheduled procedures.  2. Diet: Do not eat or drink anything after midnight prior to your procedure except sips of water to take medications.  3. Labs: Please have your labs drawn today at the [facility] office.  4. Medication instructions in preparation for your procedure:   Please hold the Metformin 24 hours prior to the procedure and then 48 hours after.     On the morning of your procedure, take your Aspirin 81 mg tablet and any morning medicines NOT listed above.  You may use sips of water.  5. Plan for one night stay--bring personal belongings. 6. Bring a current list of your medications and current insurance cards. 7. You MUST have a responsible person to drive you home. 8. Someone MUST be with you the first 24 hours after you arrive home or your discharge will be delayed. 9. Please wear clothes that are easy to get on and off and wear slip-on shoes.  Thank you for allowing [place] to care for you!   -- [place] [facility]       Medication Instructions: Take Nitroglycerin as needed for chest pain. Place one tablet under the tongue every 5 minutes for chest pain (with a limit of three tablets within a 15 minute time frame) If you need to take the third tablet, please call 911.   If you need a  refill on your cardiac medications before your next appointment, please call your pharmacy.    Thank you for choosing Heartcare at [facility]!!

## 2017-12-09 NOTE — H&P (View-Only) (Signed)
Cardiology Office Note    Date:  12/11/2017   ID:  Jimmy Craft., DOB June 29, 1966, MRN [id number]  PCP:  Jimmy Herb, MD  Cardiologist:  New - Dr. Claiborne Billings  Chief Complaint  Patient presents with  . New Patient (Initial Visit)    referred by Dr. Wendi Perez for evaluation of chest pain    History of Present Illness:  Jimmy Johndaniel Catlin. is a 52 y.o. male with PMH of HTN, HLD, OSA on CPAP, and diabetes. ETT on 10/04/2015 showed no ischemic changes, normal stress test.  His younger brother had first MI at age 45.  His father had 3 stents by age 70.  He never smoked himself, he drank about 5-6 beers on the weekend.  Otherwise he has been compliant with his CPAP machine.  For the past 3 weeks, he has been noticing exertional dyspnea.  He also had a several episode of chest pain lasting up to 20 minutes each.  They typically occur in the substernal area radiating to bilateral shoulder and the back.  His chest pain is more noticeable during exertion and improved with rest.  He denies any exacerbating factors such as deep inspiration, body rotational palpation.  He says only the last night's episode was atypical, it occurred roughly 30 minutes after he ate a hot dog.  Most of the time, his chest pain corresponds more so with exertion and relieved by rest.  I have discussed the case with DOD Dr. Claiborne Billings, his cardiac risk factors include hypertension, hyperlipidemia, diabetes, obstructive sleep apnea, family history and age.  Given his significant cardiac risk factors, we opted for diagnostic cardiac catheterization for definitive evaluation.  I tried to set him up for cardiac catheterization tomorrow by Dr. Claiborne Billings, unfortunately both tomorrow and Friday's schedule are completely full.  Dr. Claiborne Billings will be rounding the entire next week and will only be back in the Cath Lab on 28 February.  Given his a daily episode of chest pain, I do not think he should wait that long.  We have scheduled the patient for  the first available slot on Monday.  He will need to hold metformin for 24 hours before and 48 hours after the cardiac catheterization.  I have also discussed the benefit and risk of the procedure with the patient, he displayed clear understanding and agreed to proceed.    Past Medical History:  Diagnosis Date  . Borderline diabetes    no meds, watching diet.  . Chronic kidney disease   . Diabetes mellitus without complication ([place])   . GERD (gastroesophageal reflux disease)   . Hyperlipidemia   . Hypertension   . Kidney stones   . OSA (obstructive sleep apnea)    Cpap use--study 12-26-14 Epic suggestive of this.  . Pre-diabetes    "per patient"  . Vitamin D deficiency     Past Surgical History:  Procedure Laterality Date  . CYSTOSCOPY WITH RETROGRADE PYELOGRAM, URETEROSCOPY AND STENT PLACEMENT Right 05/29/2017   Procedure: CYSTOSCOPY WITH RETROGRADE PYELOGRAM, URETEROSCOPY AND STENT PLACEMENT;  Surgeon: [name], MD;  Location: [facility];  Service: Urology;  Laterality: Right;  . EUS N/A 02/08/2015   Procedure: UPPER ENDOSCOPIC ULTRASOUND (EUS) LINEAR;  Surgeon: [name], MD;  Location: [facility];  Service: Endoscopy;  Laterality: N/A;  . EUS N/A 09/25/2016   Procedure: UPPER ENDOSCOPIC ULTRASOUND (EUS) RADIAL;  Surgeon: [name], MD;  Location: [facility];  Service: Endoscopy;  Laterality: N/A;  . HOLMIUM LASER  APPLICATION Right 3/0/1601   Procedure: HOLMIUM LASER APPLICATION;  Surgeon: [name], MD;  Location: [facility];  Service: Urology;  Laterality: Right;  . KNEE ARTHROSCOPY Right 11/28/95   [name])    Current Medications: Outpatient Medications Prior to Visit  Medication Sig Dispense Refill  . amLODipine (NORVASC) 5 MG tablet Take 1 tablet by mouth  daily (Patient taking differently: Take 5 mg by mouth daily. ) 90 tablet 3  . colchicine 0.6 MG tablet Take 1 tablet (0.6 mg total) by mouth daily. May repeat in 2 hours x1 in 24hours (Patient  taking differently: Take 0.6 mg by mouth daily as needed (for gout). May repeat in 2 hours x1 in 24hours) 20 tablet 1  . cyclobenzaprine (FLEXERIL) 10 MG tablet Take 1 tablet (10 mg total) by mouth 3 (three) times daily as needed for muscle spasms. 30 tablet 1  . losartan (COZAAR) 50 MG tablet Take 1 tablet (50 mg total) by mouth daily. 90 tablet 0  . metFORMIN (GLUCOPHAGE) 500 MG tablet Take 1 tablet (500 mg total) by mouth daily with breakfast. 90 tablet 3  . metoprolol succinate (TOPROL-XL) 25 MG 24 hr tablet Take 1 tablet (25 mg total) by mouth daily. 30 tablet 2  . Omega-3 Fatty Acids (FISH OIL) 1000 MG CAPS Take 1,000 mg by mouth daily.     Marland Kitchen omeprazole (PRILOSEC) 40 MG capsule Take 1 capsule (40 mg total) by mouth daily. 90 capsule 3  . Vitamin D, Ergocalciferol, (DRISDOL) 50000 units CAPS capsule Take 1 capsule (50,000 Units total) by mouth every 7 (seven) days. (Patient taking differently: Take 50,000 Units by mouth every Monday. ) 12 capsule 3  . amoxicillin-clavulanate (AUGMENTIN) 875-125 MG tablet Take 1 tablet by mouth 2 (two) times daily. 20 tablet 0  . Menthol, Topical Analgesic, (ICY HOT EX) Apply 1 application topically daily.     No facility-administered medications prior to visit.      Allergies:   Patient has no known allergies.   Social History   Socioeconomic History  . Marital status: Married    Spouse name: Tye Maryland   . Number of children: 2  . Years of education: None  . Highest education level: None  Social Needs  . Financial resource strain: None  . Food insecurity - worry: None  . Food insecurity - inability: None  . Transportation needs - medical: None  . Transportation needs - non-medical: None  Occupational History  . Occupation: Financial trader  Tobacco Use  . Smoking status: Never Smoker  . Smokeless tobacco: Current User    Types: Snuff  . Tobacco comment: off an on (pouches) of snuff  Substance and Sexual Activity  . Alcohol use: Yes     Alcohol/week: 3.6 oz    Types: 6 Cans of beer per week    Comment: occasional- depending on the occasion  . Drug use: No  . Sexual activity: None  Other Topics Concern  . None  Social History Narrative  . None     Family History:  The patient's family history includes Colon polyps in his brother; Diabetes in his father, mother, and sister; Heart attack (age of onset: 47) in his brother; Heart disease in his father; Hyperlipidemia in his brother and father; Hypertension in his brother, father, and sister.   ROS:   Please see the history of present illness.    ROS All other systems reviewed and are negative.   PHYSICAL EXAM:   VS:  BP 116/71  Pulse 63   Ht 5' 7.5" (1.715 m)   Wt 244 lb 3.2 oz (110.8 kg)   BMI 37.68 kg/m    GEN: Well nourished, well developed, in no acute distress  HEENT: normal  Neck: no JVD, carotid bruits, or masses Cardiac: RRR; no murmurs, rubs, or gallops,no edema  Respiratory:  clear to auscultation bilaterally, normal work of breathing GI: soft, nontender, nondistended, + BS MS: no deformity or atrophy  Skin: warm and dry, no rash Neuro:  Alert and Oriented x 3, Strength and sensation are intact Psych: euthymic mood, full affect  Wt Readings from Last 3 Encounters:  12/09/17 244 lb 3.2 oz (110.8 kg)  11/28/17 246 lb (111.6 kg)  11/16/17 248 lb 12.8 oz (112.9 kg)      Studies/Labs Reviewed:   EKG:  EKG is ordered today.  The ekg ordered today demonstrates NSR with TWI in inferior leads  Recent Labs: 06/24/2017: ALT 24 12/09/2017: BUN 13; Creatinine, Ser 0.98; Hemoglobin 15.3; Platelets 336; Potassium 5.1; Sodium 137   Lipid Panel    Component Value Date/Time   CHOL 178 01/27/2017 0800   TRIG 394 (H) 01/27/2017 0800   HDL 32 (L) 01/27/2017 0800   CHOLHDL 5.6 (H) 01/27/2017 0800   CHOLHDL 5.4 04/18/2013 0916   VLDL 76 (H) 04/18/2013 0916   LDLCALC 67 01/27/2017 0800   LDLDIRECT 88 06/06/2015 1527    Additional studies/ records that  were reviewed today include:   ETT 10/04/2015 Study Highlights    There was no ST segment deviation noted during stress.  no angina during the stress test  85% of maximum heart rate was achieved after 6.4 minutes  no ST segment deviation noted during stress  no significant arrhythmias noted during the test   Patient was able to achieve 85% maximal heart rate according to Bruce protocol. No symptoms of angina or shortness of breath were elicited. EKG showed no ST changes or other arrhythmias that would be indicative of coronary artery disease. Normal stress test.       ASSESSMENT:    1. Angina, class III ([place])   2. Pre-op testing   3. Essential hypertension   4. Hyperlipidemia, unspecified hyperlipidemia type   5. OSA on CPAP   6. Controlled type 2 diabetes mellitus without complication, without long-term current use of insulin (HCC)      PLAN:  In order of problems listed above:  1. Progressive class III angina: Patient is having progressive worsening chest pain with exertion.  We recommended cardiac catheterization.   - Risk and benefit of procedure explained to the patient who display clear understanding and agree to proceed.  Discussed with patient possible procedural risk include bleeding, vascular injury, renal injury, arrythmia, MI, stroke and loss of limb or life.  -Given nitroglycerin on hand and instructed the patient to seek medical attention if continued to have chest pain after the second nitroglycerin.  2. Hypertension: Blood pressure stable  3. Hyperlipidemia: We will need to consider addition of high-dose statin depending on catheter results.  4. DM 2: Hold metformin for 24 hours prior to cath in the 48 hours after cath.  5. Obstructive sleep apnea on CPAP: Compliant    Medication Adjustments/Labs and Tests Ordered: Current medicines are reviewed at length with the patient today.  Concerns regarding medicines are outlined above.  Medication changes,  Labs and Tests ordered today are listed in the Patient Instructions below. Patient Instructions    [place]  [address] [place] [address] Dept: [phone] Loc: [place].  12/09/2017  You are scheduled for a Cardiac Catheterization on Monday February the 18th with Dr. Peter [name].  1. Please arrive at the [facility] (Main Entrance A) at [facility]: [address] [place], [place] [address] at 5:30 AM (two hours before your procedure to ensure your preparation). Free valet parking service is available.   Special note: Every effort is made to have your procedure done on time. Please understand that emergencies sometimes delay scheduled procedures.  2. Diet: Do not eat or drink anything after midnight prior to your procedure except sips of water to take medications.  3. Labs: Please have your labs drawn today at the [facility] office.  4. Medication instructions in preparation for your procedure:   Please hold the Metformin 24 hours prior to the procedure and then 48 hours after.     On the morning of your procedure, take your Aspirin 81 mg tablet and any morning medicines NOT listed above.  You may use sips of water.  5. Plan for one night stay--bring personal belongings. 6. Bring a current list of your medications and current insurance cards. 7. You MUST have a responsible person to drive you home. 8. Someone MUST be with you the first 24 hours after you arrive home or your discharge will be delayed. 9. Please wear clothes that are easy to get on and off and wear slip-on shoes.  Thank you for allowing [place] to care for you!   -- [facility]       Medication Instructions: Take Nitroglycerin as needed for chest pain. Place one tablet under the tongue every 5 minutes for chest pain (with a limit of three tablets  within a 15 minute time frame) If you need to take the third tablet, please call 911.   If you need a refill on your cardiac medications before your next appointment, please call your pharmacy.    Thank you for choosing [facility] at [place] [place]  12/11/2017 8:03 AM    [address], [place], [place]  [address] Phone: [phone]; Fax: [phone]

## 2017-12-09 NOTE — Telephone Encounter (Signed)
Patient wife Tye Maryland)  calling, currently scheduled for Cath with Dr. [name] 12-14-17. Patient is requesting 12-11-17 if possible.

## 2017-12-10 ENCOUNTER — Telehealth: Payer: Self-pay | Admitting: *Deleted

## 2017-12-10 ENCOUNTER — Other Ambulatory Visit: Payer: Self-pay | Admitting: Physician Assistant

## 2017-12-10 ENCOUNTER — Telehealth: Payer: Self-pay | Admitting: Cardiovascular Disease

## 2017-12-10 LAB — CBC
HEMATOCRIT: 43.7 % (ref 37.5–51.0)
Hemoglobin: 15.3 g/dL (ref 13.0–17.7)
MCH: 32 pg (ref 26.6–33.0)
MCHC: 35 g/dL (ref 31.5–35.7)
MCV: 91 fL (ref 79–97)
PLATELETS: 336 10*3/uL (ref 150–379)
RBC: 4.78 x10E6/uL (ref 4.14–5.80)
RDW: 14.1 % (ref 12.3–15.4)
WBC: 7.2 10*3/uL (ref 3.4–10.8)

## 2017-12-10 LAB — BASIC METABOLIC PANEL
BUN/Creatinine Ratio: 13 (ref 9–20)
BUN: 13 mg/dL (ref 6–24)
CALCIUM: 11 mg/dL — AB (ref 8.7–10.2)
CHLORIDE: 99 mmol/L (ref 96–106)
CO2: 30 mmol/L — ABNORMAL HIGH (ref 20–29)
Creatinine, Ser: 0.98 mg/dL (ref 0.76–1.27)
GFR, EST AFRICAN AMERICAN: 103 mL/min/{1.73_m2} (ref >59)
GFR, EST NON AFRICAN AMERICAN: 89 mL/min/{1.73_m2} (ref >59)
Glucose: 127 mg/dL — ABNORMAL HIGH (ref 65–99)
Potassium: 5.1 mmol/L (ref 3.5–5.2)
Sodium: 137 mmol/L (ref 134–144)

## 2017-12-10 LAB — PROTIME-INR
INR: 0.9 (ref 0.8–1.2)
Prothrombin Time: 9.3 s (ref 9.1–12.0)

## 2017-12-10 MED ORDER — NITROGLYCERIN 0.4 MG SL SUBL: 0.4000 mg | SUBLINGUAL_TABLET | SUBLINGUAL | 1 refills | Status: DC | PRN

## 2017-12-10 NOTE — Telephone Encounter (Signed)
Received call back from wife (ok per DPR)-wife wondering if their was any cancellations on the cath schedule for Friday.  Advised per schedule review, it looks as if there are still no availabilities.   Wife aware and verbalized understanding.  Will plan for cath Monday as scheduled with Dr. [name].

## 2017-12-10 NOTE — Telephone Encounter (Addendum)
Spoke with wife and verified correct rx that needed to be sent to correct pharmacy. Rx sent. Wife voiced understanding.

## 2017-12-10 NOTE — Telephone Encounter (Signed)
Called patient to inform him of his refill and he wanted me to contact his wife; she handles his meds and could better assist me. Left message for wife to contact office.

## 2017-12-10 NOTE — Telephone Encounter (Signed)
Pt contacted pre-catheterization scheduled at [facility] for: Monday February 18,2019 7:30 AM Verified arrival time and place: [place] at: 5:30 AM Verified no known allergies.  HOLD: No metformin 12/13/17, 12/14/17 and 48 hours post cath.   Except hold medications AM meds can be  taken pre-cath with sip of water including: ASA 81 mg am of cath   Confirmed patient has responsible person to drive home post procedure and observe patient for 24 hours: yes

## 2017-12-10 NOTE — Telephone Encounter (Signed)
New message  Patient spouse called to update pharmacy**   *STAT* If patient is at the pharmacy, call can be transferred to refill team.   1. Which medications need to be refilled? (please list name of each medication and dose if known) nitroGLYCERIN (NITROSTAT) 0.4 MG SL tablet  2. Which pharmacy/location (including street and city if local pharmacy) is medication to be sent to?[place], [place]  3. Do they need a 30 day or 90 day supply? [place]

## 2017-12-11 ENCOUNTER — Other Ambulatory Visit: Payer: Self-pay | Admitting: Physician Assistant

## 2017-12-11 ENCOUNTER — Encounter: Payer: Self-pay | Admitting: Physician Assistant

## 2017-12-11 NOTE — Addendum Note (Signed)
Addended by: Vennie Homans on: 12/11/2017 03:30 PM   Modules accepted: Orders

## 2017-12-11 NOTE — Addendum Note (Signed)
Addended byEulas Post, Zabian Swayne on: 12/11/2017 08:03 AM   Modules accepted: Level of Service

## 2017-12-14 ENCOUNTER — Encounter (HOSPITAL_COMMUNITY)
Admission: RE | Disposition: A | Discharge status: Home / Self Care | Payer: Self-pay | Source: Ambulatory Visit | Attending: Cardiology

## 2017-12-14 ENCOUNTER — Ambulatory Visit (HOSPITAL_COMMUNITY)
Admission: RE | Admit: 2017-12-14 | Discharge: 2017-12-14 | Disposition: A | Discharge status: Home / Self Care | Payer: 59 | Source: Ambulatory Visit | Attending: Cardiology | Admitting: Cardiology

## 2017-12-14 DIAGNOSIS — Z955 Presence of coronary angioplasty implant and graft: Secondary | ICD-10-CM

## 2017-12-14 DIAGNOSIS — I25119 Atherosclerotic heart disease of native coronary artery with unspecified angina pectoris: Secondary | ICD-10-CM | POA: Diagnosis not present

## 2017-12-14 DIAGNOSIS — E119 Type 2 diabetes mellitus without complications: Secondary | ICD-10-CM

## 2017-12-14 DIAGNOSIS — M109 Gout, unspecified: Secondary | ICD-10-CM | POA: Insufficient documentation

## 2017-12-14 DIAGNOSIS — Z7984 Long term (current) use of oral hypoglycemic drugs: Secondary | ICD-10-CM | POA: Diagnosis not present

## 2017-12-14 DIAGNOSIS — E785 Hyperlipidemia, unspecified: Secondary | ICD-10-CM | POA: Insufficient documentation

## 2017-12-14 DIAGNOSIS — K219 Gastro-esophageal reflux disease without esophagitis: Secondary | ICD-10-CM | POA: Diagnosis not present

## 2017-12-14 DIAGNOSIS — I129 Hypertensive chronic kidney disease with stage 1 through stage 4 chronic kidney disease, or unspecified chronic kidney disease: Secondary | ICD-10-CM | POA: Insufficient documentation

## 2017-12-14 DIAGNOSIS — E1169 Type 2 diabetes mellitus with other specified complication: Secondary | ICD-10-CM

## 2017-12-14 DIAGNOSIS — G4733 Obstructive sleep apnea (adult) (pediatric): Secondary | ICD-10-CM | POA: Insufficient documentation

## 2017-12-14 DIAGNOSIS — Z6836 Body mass index (BMI) 36.0-36.9, adult: Secondary | ICD-10-CM | POA: Diagnosis not present

## 2017-12-14 DIAGNOSIS — E559 Vitamin D deficiency, unspecified: Secondary | ICD-10-CM | POA: Diagnosis not present

## 2017-12-14 DIAGNOSIS — N189 Chronic kidney disease, unspecified: Secondary | ICD-10-CM | POA: Diagnosis not present

## 2017-12-14 DIAGNOSIS — E1122 Type 2 diabetes mellitus with diabetic chronic kidney disease: Secondary | ICD-10-CM | POA: Diagnosis not present

## 2017-12-14 DIAGNOSIS — I10 Essential (primary) hypertension: Secondary | ICD-10-CM | POA: Diagnosis present

## 2017-12-14 DIAGNOSIS — Z8249 Family history of ischemic heart disease and other diseases of the circulatory system: Secondary | ICD-10-CM | POA: Insufficient documentation

## 2017-12-14 DIAGNOSIS — I209 Angina pectoris, unspecified: Secondary | ICD-10-CM | POA: Diagnosis present

## 2017-12-14 DIAGNOSIS — Z87442 Personal history of urinary calculi: Secondary | ICD-10-CM | POA: Diagnosis not present

## 2017-12-14 DIAGNOSIS — Z79899 Other long term (current) drug therapy: Secondary | ICD-10-CM | POA: Diagnosis not present

## 2017-12-14 DIAGNOSIS — Z8371 Family history of colonic polyps: Secondary | ICD-10-CM | POA: Diagnosis not present

## 2017-12-14 HISTORY — PX: CORONARY STENT INTERVENTION: CATH118234

## 2017-12-14 HISTORY — PX: LEFT HEART CATH AND CORONARY ANGIOGRAPHY: CATH118249

## 2017-12-14 LAB — POCT ACTIVATED CLOTTING TIME
Activated Clotting Time: 230 seconds
Activated Clotting Time: 268 seconds

## 2017-12-14 LAB — GLUCOSE, CAPILLARY
Glucose-Capillary: 146 mg/dL — ABNORMAL HIGH (ref 65–99)
Glucose-Capillary: 168 mg/dL — ABNORMAL HIGH (ref 65–99)

## 2017-12-14 SURGERY — LEFT HEART CATH AND CORONARY ANGIOGRAPHY
Anesthesia: LOCAL

## 2017-12-14 MED ORDER — CLOPIDOGREL BISULFATE 300 MG PO TABS
ORAL_TABLET | ORAL | Status: DC | PRN
  Administered 2017-12-14: 600 mg via ORAL

## 2017-12-14 MED ORDER — VERAPAMIL HCL 2.5 MG/ML IV SOLN
INTRAVENOUS | Status: DC | PRN
  Administered 2017-12-14: 10 mL via INTRA_ARTERIAL

## 2017-12-14 MED ORDER — LIDOCAINE HCL (PF) 1 % IJ SOLN
INTRAMUSCULAR | Status: DC | PRN
  Administered 2017-12-14: 2 mL via SUBCUTANEOUS

## 2017-12-14 MED ORDER — SODIUM CHLORIDE 0.9% FLUSH: 3.0000 mL | Freq: Two times a day (BID) | INTRAVENOUS | Status: DC

## 2017-12-14 MED ORDER — CLOPIDOGREL BISULFATE 75 MG PO TABS: 75.0000 mg | ORAL_TABLET | Freq: Every day | ORAL | 0 refills | Status: DC

## 2017-12-14 MED ORDER — ACETAMINOPHEN 325 MG PO TABS: 650.0000 mg | ORAL_TABLET | ORAL | Status: DC | PRN

## 2017-12-14 MED ORDER — HEPARIN (PORCINE) IN NACL 2-0.9 UNIT/ML-% IJ SOLN
INTRAMUSCULAR | Status: DC | PRN
  Administered 2017-12-14 (×2): 500 mL

## 2017-12-14 MED ORDER — IOPAMIDOL (ISOVUE-370) INJECTION 76%
INTRAVENOUS | Status: DC | PRN
  Administered 2017-12-14: 76.8 mL via INTRA_ARTERIAL

## 2017-12-14 MED ORDER — SODIUM CHLORIDE 0.9 % WEIGHT BASED INFUSION: 1.0000 mL/kg/h | INTRAVENOUS | Status: DC

## 2017-12-14 MED ORDER — ROSUVASTATIN CALCIUM 20 MG PO TABS: 20.0000 mg | ORAL_TABLET | Freq: Every day | ORAL | 1 refills | Status: DC

## 2017-12-14 MED ORDER — CLOPIDOGREL BISULFATE 75 MG PO TABS: 75.0000 mg | ORAL_TABLET | Freq: Every day | ORAL | Status: DC

## 2017-12-14 MED ORDER — SODIUM CHLORIDE 0.9 % IV SOLN: 250.0000 mL | INTRAVENOUS | Status: DC | PRN

## 2017-12-14 MED ORDER — CLOPIDOGREL BISULFATE 300 MG PO TABS
ORAL_TABLET | ORAL | Status: AC
  Filled 2017-12-14: qty 1

## 2017-12-14 MED ORDER — PANTOPRAZOLE SODIUM 40 MG PO TBEC: 40.0000 mg | DELAYED_RELEASE_TABLET | Freq: Every day | ORAL | 1 refills | Status: DC

## 2017-12-14 MED ORDER — CLOPIDOGREL BISULFATE 75 MG PO TABS: 75.0000 mg | ORAL_TABLET | Freq: Every day | ORAL | 2 refills | Status: DC

## 2017-12-14 MED ORDER — ONDANSETRON HCL 4 MG/2ML IJ SOLN: 4.0000 mg | Freq: Four times a day (QID) | INTRAMUSCULAR | Status: DC | PRN

## 2017-12-14 MED ORDER — SODIUM CHLORIDE 0.9% FLUSH: 3.0000 mL | INTRAVENOUS | Status: DC | PRN

## 2017-12-14 MED ORDER — SODIUM CHLORIDE 0.9 % WEIGHT BASED INFUSION: 1.0000 mL/kg/h | INTRAVENOUS | Status: AC

## 2017-12-14 MED ORDER — FENTANYL CITRATE (PF) 100 MCG/2ML IJ SOLN
INTRAMUSCULAR | Status: AC
  Filled 2017-12-14: qty 2

## 2017-12-14 MED ORDER — MIDAZOLAM HCL 2 MG/2ML IJ SOLN
INTRAMUSCULAR | Status: DC | PRN
  Administered 2017-12-14: 1 mg via INTRAVENOUS

## 2017-12-14 MED ORDER — FENTANYL CITRATE (PF) 100 MCG/2ML IJ SOLN
INTRAMUSCULAR | Status: DC | PRN
  Administered 2017-12-14: 25 ug via INTRAVENOUS

## 2017-12-14 MED ORDER — HEPARIN SODIUM (PORCINE) 1000 UNIT/ML IJ SOLN
INTRAMUSCULAR | Status: AC
  Filled 2017-12-14: qty 1

## 2017-12-14 MED ORDER — ANGIOPLASTY BOOK
Freq: Once | Status: DC
  Filled 2017-12-14 (×2): qty 1

## 2017-12-14 MED ORDER — SODIUM CHLORIDE 0.9 % WEIGHT BASED INFUSION
3.0000 mL/kg/h | INTRAVENOUS | Status: AC
  Administered 2017-12-14: 3 mL/kg/h via INTRAVENOUS

## 2017-12-14 MED ORDER — HEPARIN SODIUM (PORCINE) 1000 UNIT/ML IJ SOLN
INTRAMUSCULAR | Status: DC | PRN
  Administered 2017-12-14: 5000 [IU] via INTRAVENOUS
  Administered 2017-12-14: 3000 [IU] via INTRAVENOUS
  Administered 2017-12-14: 5000 [IU] via INTRAVENOUS

## 2017-12-14 MED ORDER — VERAPAMIL HCL 2.5 MG/ML IV SOLN
INTRAVENOUS | Status: AC
  Filled 2017-12-14: qty 2

## 2017-12-14 MED ORDER — HEPARIN (PORCINE) IN NACL 2-0.9 UNIT/ML-% IJ SOLN
INTRAMUSCULAR | Status: AC
  Filled 2017-12-14: qty 1000

## 2017-12-14 MED ORDER — MIDAZOLAM HCL 2 MG/2ML IJ SOLN
INTRAMUSCULAR | Status: AC
  Filled 2017-12-14: qty 2

## 2017-12-14 MED ORDER — ASPIRIN 81 MG PO CHEW: 81.0000 mg | CHEWABLE_TABLET | ORAL | Status: DC

## 2017-12-14 MED FILL — CLOPIDOGREL 75 MG TABLET: 75 | 30 days supply | Qty: 30 | Fill #0

## 2017-12-14 SURGICAL SUPPLY — 20 items
BALLN EMERGE MR 2.5X12 (BALLOONS) ×2
BALLN ~~LOC~~ EMERGE MR 3.25X8 (BALLOONS) ×2
BALLOON EMERGE MR 2.5X12 (BALLOONS) ×1 IMPLANT
BALLOON ~~LOC~~ EMERGE MR 3.25X8 (BALLOONS) ×1 IMPLANT
CATH IMPULSE 5F ANG/FL3.5 (CATHETERS) ×2 IMPLANT
CATH LAUNCHER 6FR EBU3.5 (CATHETERS) ×2 IMPLANT
DEVICE RAD COMP TR BAND LRG (VASCULAR PRODUCTS) ×2 IMPLANT
GLIDESHEATH SLEND SS 6F .021 (SHEATH) ×4 IMPLANT
GUIDEWIRE INQWIRE 1.5J.035X260 (WIRE) ×1 IMPLANT
INQWIRE 1.5J .035X260CM (WIRE) ×2
KIT ENCORE 26 ADVANTAGE (KITS) ×2 IMPLANT
KIT HEMO VALVE WATCHDOG (MISCELLANEOUS) ×2 IMPLANT
KIT PREMIUM HAND CONTROLLER (KITS) ×2 IMPLANT
KIT SINGLE USE MANIFOLD (KITS) ×2 IMPLANT
KIT SYR MULTI USE (KITS) ×2 IMPLANT
PACK CARDIAC CATHETERIZATION (CUSTOM PROCEDURE TRAY) ×2 IMPLANT
PROTECTION STATION PRESSURIZED (MISCELLANEOUS) ×2
STATION PROTECTION PRESSURIZED (MISCELLANEOUS) ×1 IMPLANT
STENT SYNERGY DES 3X12 (Permanent Stent) ×2 IMPLANT
WIRE ASAHI PROWATER 180CM (WIRE) ×2 IMPLANT

## 2017-12-14 NOTE — Discharge Instructions (Signed)
Radial Site Care Refer to this sheet in the next few weeks. These instructions provide you with information on caring for yourself after your procedure. Your caregiver may also give you more specific instructions. Your treatment has been planned according to current medical practices, but problems sometimes occur. Call your caregiver if you have any problems or questions after your procedure. HOME CARE INSTRUCTIONS You may shower the day after the procedure.Remove the bandage (dressing) and gently wash the site with plain soap and water.Gently pat the site dry.  Do not apply powder or lotion to the site.  Do not submerge the affected site in water for 3 to 5 days.  Inspect the site at least twice daily.  Do not flex or bend the affected arm for 24 hours.  No lifting over 5 pounds (2.3 kg) for 5 days after your procedure.  Do not drive home if you are discharged the same day of the procedure. Have someone else drive you.  You may drive 24 hours after the procedure unless otherwise instructed by your caregiver.  What to expect: Any bruising will usually fade within 1 to 2 weeks.  Blood that collects in the tissue (hematoma) may be painful to the touch. It should usually decrease in size and tenderness within 1 to 2 weeks.  SEEK IMMEDIATE MEDICAL CARE IF: You have unusual pain at the radial site.  You have redness, warmth, swelling, or pain at the radial site.  You have drainage (other than a small amount of blood on the dressing).  You have chills.  You have a fever or persistent symptoms for more than 72 hours.  You have a fever and your symptoms suddenly get worse.  Your arm becomes pale, cool, tingly, or numb.  You have heavy bleeding from the site. Hold pressure on the site.   PLEASE DO NOT MISS ANY DOSES OF YOUR PLAVIX!!!!! Also keep a log of you blood pressures and bring back to your follow up appt. Please call the office with any questions.   Patients taking blood thinners should  generally stay away from medicines like ibuprofen, Advil, Motrin, naproxen, and Aleve due to risk of stomach bleeding. You may take Tylenol as directed or talk to your primary doctor about alternatives.  Some studies suggest Prilosec/Omeprazole interacts with Plavix. We changed your Prilosec/Omeprazole to the equivalent dose of Protonix for less chance of interaction.  Radial Site Care Refer to this sheet in the next few weeks. These instructions provide you with information about caring for yourself after your procedure. Your health care provider may also give you more specific instructions. Your treatment has been planned according to current medical practices, but problems sometimes occur. Call your health care provider if you have any problems or questions after your procedure. What can I expect after the procedure? After your procedure, it is typical to have the following:  Bruising at the radial site that usually fades within 1-2 weeks.  Blood collecting in the tissue (hematoma) that may be painful to the touch. It should usually decrease in size and tenderness within 1-2 weeks.  Follow these instructions at home:  Take medicines only as directed by your health care provider.  You may shower 24-48 hours after the procedure or as directed by your health care provider. Remove the bandage (dressing) and gently wash the site with plain soap and water. Pat the area dry with a clean towel. Do not rub the site, because this may cause bleeding.  Do not take  baths, swim, or use a hot tub until your health care provider approves.  Check your insertion site every day for redness, swelling, or drainage.  Do not apply powder or lotion to the site.  Do not flex or bend the affected arm for 24 hours or as directed by your health care provider.  Do not push or pull heavy objects with the affected arm for 24 hours or as directed by your health care provider.  Do not lift over 10 lb (4.5 kg) for 5  days after your procedure or as directed by your health care provider.  Ask your health care provider when it is okay to: ? Return to work or school. ? Resume usual physical activities or sports. ? Resume sexual activity.  Do not drive home if you are discharged the same day as the procedure. Have someone else drive you.  You may drive 24 hours after the procedure unless otherwise instructed by your health care provider.  Do not operate machinery or power tools for 24 hours after the procedure.  If your procedure was done as an outpatient procedure, which means that you went home the same day as your procedure, a responsible adult should be with you for the first 24 hours after you arrive home.  Keep all follow-up visits as directed by your health care provider. This is important. Contact a health care provider if:  You have a fever.  You have chills.  You have increased bleeding from the radial site. Hold pressure on the site. Get help right away if:  You have unusual pain at the radial site.  You have redness, warmth, or swelling at the radial site.  You have drainage (other than a small amount of blood on the dressing) from the radial site.  The radial site is bleeding, and the bleeding does not stop after 30 minutes of holding steady pressure on the site.  Your arm or hand becomes pale, cool, tingly, or numb. This information is not intended to replace advice given to you by your health care provider. Make sure you discuss any questions you have with your health care provider. Document Released: 11/15/2010 Document Revised: 03/20/2016 Document Reviewed: 05/01/2014 Elsevier Interactive Patient Education  2018 [employer].

## 2017-12-14 NOTE — Interval H&P Note (Signed)
History and Physical Interval Note:  12/14/2017 7:15 AM  Jimmy Perez.  has presented today for surgery, with the diagnosis of unstable angina  The various methods of treatment have been discussed with the patient and family. After consideration of risks, benefits and other options for treatment, the patient has consented to  Procedure(s): LEFT HEART CATH AND CORONARY ANGIOGRAPHY (N/A) as a surgical intervention .  The patient's history has been reviewed, patient examined, no change in status, stable for surgery.  I have reviewed the patient's chart and labs.  Questions were answered to the patient's satisfaction.   Cath Lab Visit (complete for each Cath Lab visit)  Clinical Evaluation Leading to the Procedure:   ACS: No.  Non-ACS:    Anginal Classification: CCS III  Anti-ischemic medical therapy: Minimal Therapy (1 class of medications)  Non-Invasive Test Results: No non-invasive testing performed  Prior CABG: No previous CABG        Juliani Laduke [name] MD,FACC 12/14/2017 7:15 AM

## 2017-12-14 NOTE — Discharge Summary (Signed)
Discharge Summary/SAME DAY PCI    Patient ID: Jimmy Jaman Aro.,  MRN: [id number], DOB/AGE: 51-23-1967 51 y.o.  Admit date: 12/14/2017 Discharge date: 12/14/2017  Primary Care Provider: [name] Primary Cardiologist: [facility]  Discharge Diagnoses    Principal Problem:   Angina pectoris [facility]) Active Problems:   HTN (hypertension)   Hyperlipidemia   Morbid obesity ([place])   Obstructive sleep apnea   Type 2 diabetes mellitus without complication, without long-term current use of insulin (HCC)   Allergies No Known Allergies  Diagnostic Studies/Procedures    Cath: 12/14/17  Conclusion     Ost 1st Mrg lesion is 90% stenosed.  Post intervention, there is a 0% residual stenosis.  A drug-eluting stent was successfully placed using a STENT SYNERGY DES 3X12.  The left ventricular systolic function is normal.  LV end diastolic pressure is normal.  The left ventricular ejection fraction is 55-65% by visual estimate.   1. Single vessel obstructive CAD 2. Normal LV function 3. Normal LVEDP 4. Successful PCI of the first OM with DES  Plan: DAPT for one year. High intensity statin. Resume Metformin in 2 days. Patient is a candidate for same day discharge.   _____________   History of Present Illness     52 y.o. male with PMH of HTN, HLD, OSA on CPAP, and diabetes. ETT on 10/04/2015 showed no ischemic changes, normal stress test.  His younger brother had first MI at age 16.  His father had 3 stents by age 21.  He never smoked himself, he drank about 5-6 beers on the weekend.  Otherwise he has been compliant with his CPAP machine.  For the past 3 weeks prior to his office visit, he had been noticing exertional dyspnea. He also had a several episode of chest pain lasting up to 20 minutes each.  They typically occured in the substernal area radiating to bilateral shoulder and the back.  His chest pain was more noticeable during exertion and improved with rest.  He denied  any exacerbating factors such as deep inspiration, body rotational palpation. Most of the time, his chest pain corresponded more so with exertion and relieved by rest.  Case discussed with DOD Dr. Claiborne Billings, his cardiac risk factors include hypertension, hyperlipidemia, diabetes, obstructive sleep apnea, family history and age.  Given his significant cardiac risk factors, we opted for diagnostic cardiac catheterization for definitive evaluation.  Hospital Course     Underwent cardiac cath with Dr. [name] noted above with PCI/DESx1 to OM. Plan for DAPT with ASA/plavix for at least one year. He has had trouble with statins and myalgias in the past but agreeable to resume. Will restart Crestor 20mg  daily at discharge. He was seen by cardiac rehab while in short stay. Radial cath site stable. Normal EF on cath. Instructions/restrictions given prior to discharge.    Tytan Loretha Stapler. was seen by Dr. [name] and determined stable for discharge home. Follow up in the office has been arranged. Medications are listed below.   _____________  Discharge Vitals Blood pressure (!) 144/71, pulse (!) 44, temperature 97.8 F (36.6 C), temperature source Oral, resp. rate 15, height 5\' 7"  (1.702 m), weight 235 lb (106.6 kg), SpO2 97 %.  Filed Weights   12/14/17 0542  Weight: 235 lb (106.6 kg)    Labs & Radiologic Studies    CBC No results for input(s): WBC, NEUTROABS, HGB, HCT, MCV, PLT in the last 72 hours. Basic Metabolic Panel No results for input(s):  NA, K, CL, CO2, GLUCOSE, BUN, CREATININE, CALCIUM, MG, PHOS in the last 72 hours. Liver Function Tests No results for input(s): AST, ALT, ALKPHOS, BILITOT, PROT, ALBUMIN in the last 72 hours. No results for input(s): LIPASE, AMYLASE in the last 72 hours. Cardiac Enzymes No results for input(s): CKTOTAL, CKMB, CKMBINDEX, TROPONINI in the last 72 hours. BNP Invalid input(s): POCBNP D-Dimer No results for input(s): DDIMER in the last 72 hours. Hemoglobin  A1C No results for input(s): HGBA1C in the last 72 hours. Fasting Lipid Panel No results for input(s): CHOL, HDL, LDLCALC, TRIG, CHOLHDL, LDLDIRECT in the last 72 hours. Thyroid Function Tests No results for input(s): TSH, T4TOTAL, T3FREE, THYROIDAB in the last 72 hours.  Invalid input(s): FREET3 _____________  No results found. Disposition   Pt is being discharged home today in good condition.  Follow-up Plans & Appointments    Follow-up Information    Almyra Deforest, [place] Follow up on 12/23/2017.   Specialties:  Cardiology, Radiology Why:  at 3pm for your follow up appt.  Contact information: [address] [place] [address] [phone]          Discharge Instructions    Amb Referral to Cardiac Rehabilitation   Complete by:  As directed    Diagnosis:  Coronary Stents      Discharge Medications     Medication List    STOP taking these medications   omeprazole 40 MG capsule Commonly known as:  PRILOSEC     TAKE these medications   amLODipine 5 MG tablet Commonly known as:  NORVASC Take 1 tablet by mouth  daily What changed:    how much to take  how to take this  when to take this  additional instructions   aspirin EC 81 MG tablet Take 81 mg by mouth daily.   clopidogrel 75 MG tablet Commonly known as:  PLAVIX Take 1 tablet (75 mg total) by mouth daily.   colchicine 0.6 MG tablet Take 1 tablet (0.6 mg total) by mouth daily. May repeat in 2 hours x1 in 24hours What changed:    when to take this  reasons to take this  additional instructions   cyclobenzaprine 10 MG tablet Commonly known as:  FLEXERIL Take 1 tablet (10 mg total) by mouth 3 (three) times daily as needed for muscle spasms.   Fish Oil 1000 MG Caps Take 1,000 mg by mouth daily.   losartan 50 MG tablet Commonly known as:  COZAAR Take 1 tablet (50 mg total) by mouth daily.   metFORMIN 500 MG tablet Commonly known as:  GLUCOPHAGE Take 1 tablet (500 mg total)  by mouth daily with breakfast.   metoprolol succinate 25 MG 24 hr tablet Commonly known as:  TOPROL-XL Take 1 tablet (25 mg total) by mouth daily.   nitroGLYCERIN 0.4 MG SL tablet Commonly known as:  NITROSTAT Place 1 tablet (0.4 mg total) under the tongue every 5 (five) minutes as needed for chest pain.   pantoprazole 40 MG tablet Commonly known as:  PROTONIX Take 1 tablet (40 mg total) by mouth daily.   rosuvastatin 20 MG tablet Commonly known as:  CRESTOR Take 1 tablet (20 mg total) by mouth at bedtime.   Vitamin D (Ergocalciferol) 50000 units Caps capsule Commonly known as:  DRISDOL Take 1 capsule (50,000 Units total) by mouth every 7 (seven) days. What changed:  when to take this        Aspirin prescribed at discharge?  Yes High Intensity Statin  Prescribed? (Lipitor 40-80mg  or Crestor 20-40mg ): Yes Beta Blocker Prescribed? Yes For EF <40%, was ACEI/ARB Prescribed? Yes ADP Receptor Inhibitor Prescribed? (i.e. Plavix etc.-Includes Medically Managed Patients): Yes For EF <40%, Aldosterone Inhibitor Prescribed? No: EF ok Was EF assessed during THIS hospitalization? Yes Was Cardiac Rehab II ordered? (Included Medically managed Patients): Yes   Outstanding Labs/Studies   FLP/LFTs in 6 weeks.   Duration of Discharge Encounter   Greater than 30 minutes including physician time.  Signed, Reino Bellis NP-C 12/14/2017, 2:09 PM

## 2017-12-14 NOTE — Progress Notes (Signed)
[id number] Education completed with pt and wife who voiced understanding. Stressed importance of plavix with stent. Reviewed NTG use, carb counting and heart healthy food choices, not using snuff, and ex ed. Referring to [place] CRP 2.  Graylon Good RN BSN 12/14/2017 11:37 AM

## 2017-12-15 ENCOUNTER — Encounter (HOSPITAL_COMMUNITY): Payer: Self-pay | Admitting: Cardiology

## 2017-12-15 ENCOUNTER — Telehealth: Payer: Self-pay | Admitting: Physician Assistant

## 2017-12-15 MED FILL — Heparin Sodium (Porcine) 2 Unit/ML in Sodium Chloride 0.9%: INTRAMUSCULAR | Qty: 1000 | Status: AC

## 2017-12-15 NOTE — Telephone Encounter (Signed)
I have called Mr. Hoos, he is doing well after cath. Will see him on followup next week.   Hilbert Corrigan PA Pager: [phone]

## 2017-12-16 ENCOUNTER — Other Ambulatory Visit: Payer: Self-pay

## 2017-12-16 MED ORDER — LANCETS MISC: 1.0000 | Freq: Every day | 2 refills | Status: DC

## 2017-12-16 MED ORDER — GLUCOSE BLOOD VI STRP: ORAL_STRIP | 2 refills | Status: DC

## 2017-12-17 ENCOUNTER — Ambulatory Visit: Payer: 59 | Admitting: Physician Assistant

## 2017-12-17 ENCOUNTER — Encounter: Payer: Self-pay | Admitting: Family Medicine

## 2017-12-17 ENCOUNTER — Telehealth (HOSPITAL_COMMUNITY): Payer: Self-pay

## 2017-12-17 ENCOUNTER — Ambulatory Visit (INDEPENDENT_AMBULATORY_CARE_PROVIDER_SITE_OTHER): Payer: 59 | Admitting: Family Medicine

## 2017-12-17 VITALS — BP 115/67 | HR 55 | Temp 97.8°F | Ht 67.0 in | Wt 240.0 lb

## 2017-12-17 DIAGNOSIS — N181 Chronic kidney disease, stage 1: Secondary | ICD-10-CM

## 2017-12-17 DIAGNOSIS — F419 Anxiety disorder, unspecified: Secondary | ICD-10-CM

## 2017-12-17 DIAGNOSIS — E785 Hyperlipidemia, unspecified: Secondary | ICD-10-CM

## 2017-12-17 DIAGNOSIS — E559 Vitamin D deficiency, unspecified: Secondary | ICD-10-CM

## 2017-12-17 DIAGNOSIS — I129 Hypertensive chronic kidney disease with stage 1 through stage 4 chronic kidney disease, or unspecified chronic kidney disease: Secondary | ICD-10-CM | POA: Diagnosis not present

## 2017-12-17 DIAGNOSIS — E1122 Type 2 diabetes mellitus with diabetic chronic kidney disease: Secondary | ICD-10-CM

## 2017-12-17 DIAGNOSIS — Z959 Presence of cardiac and vascular implant and graft, unspecified: Secondary | ICD-10-CM | POA: Diagnosis not present

## 2017-12-17 DIAGNOSIS — E1169 Type 2 diabetes mellitus with other specified complication: Secondary | ICD-10-CM | POA: Diagnosis not present

## 2017-12-17 DIAGNOSIS — N400 Enlarged prostate without lower urinary tract symptoms: Secondary | ICD-10-CM

## 2017-12-17 DIAGNOSIS — K219 Gastro-esophageal reflux disease without esophagitis: Secondary | ICD-10-CM | POA: Diagnosis not present

## 2017-12-17 DIAGNOSIS — I10 Essential (primary) hypertension: Secondary | ICD-10-CM | POA: Diagnosis not present

## 2017-12-17 DIAGNOSIS — Z95820 Peripheral vascular angioplasty status with implants and grafts: Secondary | ICD-10-CM

## 2017-12-17 MED ORDER — BUSPIRONE HCL 15 MG PO TABS: 15.0000 mg | ORAL_TABLET | Freq: Two times a day (BID) | ORAL | 1 refills | Status: DC | PRN

## 2017-12-17 MED ORDER — GLUCOSE BLOOD VI STRP: ORAL_STRIP | 11 refills | Status: DC

## 2017-12-17 NOTE — Telephone Encounter (Signed)
Patients insurance is active and benefits verified through [facility] - $25.00 co-pay, no deductible, out of pocket amount of $6,000/$226.74 has been met, no co-insurance, and no pre-authorization is required. Passport/reference [phone]  Patient will be contacted and scheduled upon review by the RN Navigator.

## 2017-12-17 NOTE — Patient Instructions (Addendum)
Continue current medications. Continue good therapeutic lifestyle changes which include good diet and exercise. Fall precautions discussed with patient. If an FOBT was given today- please return it to our front desk. If you are over 51 years old - you may need Prevnar 48 or the adult Pneumonia vaccine.  **Flu shots are available--- please call and schedule a FLU-CLINIC appointment**  After your visit with [place] today you will receive a survey in the mail or online from [employer] regarding your care with [place]. Please take a moment to fill this out. Your feedback is very important to [place] as you can help [place] better understand your patient needs as well as improve your experience and satisfaction. WE CARE ABOUT YOU!!!   Start walking Drink more water and fluids Avoid sugar Eat foods that are more baked in broiled and not fried Eat more fruits and vegetables Only use artificial sweetener like Stevia or truvia Take BuSpar if needed for anxiety as directed Follow-up with cardiology as planned KOH prep on rash behind ears Return to the office for fasting lab work or get this drawn at home

## 2017-12-17 NOTE — Progress Notes (Signed)
Subjective:    Patient ID: Jimmy Perez., male    DOB: 1966/01/23, 52 y.o.   MRN: [id number]  HPI Pt here for follow up and management of chronic medical problems which includes hypertension, diabetes and hyperlipidemia. He is taking medication regularly.  The patient recently developed some chest pressure and tightness even with minimal activity and went to the hospital where it was found that he had a 90% blockage and ended up having a stent placed for this blockage.  This is only happened recently.  He also is having some increased anxiety secondary to his workplace potentially closing.  Hopefully with all of this going on he will have a re-dedicated purpose of losing weight exercising more and eating healthy.  He is due to return in FOBT.  He will get lab work done and his wife will draw this at home when he is fasting.  His vital signs are stable.  His body mass index remains at 36.80 which with 2 or more comorbid risk factors puts him in the morbid obesity category.  He is currently taking omega-3 fatty acids Plavix pantoprazole Crestor nitroglycerin metoprolol and losartan and amlodipine and metformin.  Since he has had this blockage we will certainly try to get more aggressive with his cholesterol management.  1 of the last lipid panels I saw had a triglyceride of 394.  The last LDL C that was almost a year ago was 16.     Patient Active Problem List   Diagnosis Date Noted  . Type 2 diabetes mellitus without complication, without long-term current use of insulin ([place]) 12/14/2017  . Angina pectoris ([place]) 12/14/2017  . Abnormal CT scan, stomach   . Low back pain 12/06/2014  . Obstructive sleep apnea 11/06/2014  . Metabolic syndrome 72/53/6644  . HTN (hypertension) 04/18/2013  . Hyperlipidemia 04/18/2013  . GERD (gastroesophageal reflux disease) 04/18/2013  . Morbid obesity ([place]) 04/18/2013   Outpatient Encounter Medications as of 12/17/2017  Medication Sig  . amLODipine  (NORVASC) 5 MG tablet Take 1 tablet by mouth  daily (Patient taking differently: Take 5 mg by mouth daily. )  . aspirin EC 81 MG tablet Take 81 mg by mouth daily.  . clopidogrel (PLAVIX) 75 MG tablet Take 1 tablet (75 mg total) by mouth daily.  . colchicine 0.6 MG tablet Take 1 tablet (0.6 mg total) by mouth daily. May repeat in 2 hours x1 in 24hours (Patient taking differently: Take 0.6 mg by mouth daily as needed (for gout). May repeat in 2 hours x1 in 24hours)  . cyclobenzaprine (FLEXERIL) 10 MG tablet Take 1 tablet (10 mg total) by mouth 3 (three) times daily as needed for muscle spasms.  Marland Kitchen glucose blood (CONTOUR NEXT TEST) test strip Use as instructed  . Lancets MISC 1 Stick by Does not apply route daily.  Marland Kitchen losartan (COZAAR) 50 MG tablet Take 1 tablet (50 mg total) by mouth daily.  . metFORMIN (GLUCOPHAGE) 500 MG tablet Take 1 tablet (500 mg total) by mouth daily with breakfast.  . metoprolol succinate (TOPROL-XL) 25 MG 24 hr tablet Take 1 tablet (25 mg total) by mouth daily.  . nitroGLYCERIN (NITROSTAT) 0.4 MG SL tablet Place 1 tablet (0.4 mg total) under the tongue every 5 (five) minutes as needed for chest pain.  . Omega-3 Fatty Acids (FISH OIL) 1000 MG CAPS Take 1,000 mg by mouth daily.   . pantoprazole (PROTONIX) 40 MG tablet Take 1 tablet (40 mg total) by mouth daily.  Marland Kitchen  rosuvastatin (CRESTOR) 20 MG tablet Take 1 tablet (20 mg total) by mouth at bedtime.  . Vitamin D, Ergocalciferol, (DRISDOL) 50000 units CAPS capsule Take 1 capsule (50,000 Units total) by mouth every 7 (seven) days. (Patient taking differently: Take 50,000 Units by mouth every Monday. )   No facility-administered encounter medications on file as of 12/17/2017.      Review of Systems  Constitutional: Negative.   HENT: Negative.   Eyes: Negative.   Respiratory: Negative.   Cardiovascular: Negative.   Gastrointestinal: Negative.   Endocrine: Negative.   Genitourinary: Negative.   Musculoskeletal: Negative.     Skin: Negative.        Dry skin behind ears  Allergic/Immunologic: Negative.   Neurological: Negative.   Hematological: Negative.   Psychiatric/Behavioral: Dysphoric mood: some anxiety - discuss buspar. The patient is nervous/anxious.        Objective:   Physical Exam  Constitutional: He is oriented to person, place, and time. He appears well-developed and well-nourished. He appears distressed.  The patient is pleasant and somewhat anxious regarding his recent bout with chest pain and having stent placement.  HENT:  Head: Normocephalic and atraumatic.  Right Ear: External ear normal.  Left Ear: External ear normal.  Nose: Nose normal.  Mouth/Throat: Oropharynx is clear and moist. No oropharyngeal exudate.  Eyes: Conjunctivae and EOM are normal. Pupils are equal, round, and reactive to light. Right eye exhibits no discharge. Left eye exhibits no discharge. No scleral icterus.  Neck: Normal range of motion. Neck supple. No thyromegaly present.  No bruits thyromegaly or anterior cervical adenopathy  Cardiovascular: Normal rate, regular rhythm, normal heart sounds and intact distal pulses.  No murmur heard. The heart is regular today at 60/min  Pulmonary/Chest: Effort normal and breath sounds normal. No respiratory distress. He has no wheezes. He has no rales. He exhibits no tenderness.  Clear anteriorly and posteriorly and no axillary adenopathy  Abdominal: Soft. Bowel sounds are normal. He exhibits no mass. There is no tenderness. There is no rebound and no guarding.  Abdominal obesity without masses tenderness or organ enlargement or bruits  Musculoskeletal: Normal range of motion. He exhibits no edema.  Lymphadenopathy:    He has no cervical adenopathy.  Neurological: He is alert and oriented to person, place, and time. He has normal reflexes. No cranial nerve deficit.  Skin: Skin is warm and dry. Rash noted. There is erythema.  There is some rash and erythema behind both ears  almost like a cellulitis but there is crusting we will plan to do a KOH prep on this.  Psychiatric: He has a normal mood and affect. His behavior is normal. Judgment and thought content normal.  Nursing note and vitals reviewed.  BP 115/67 (BP Location: Left Arm)   Pulse (!) 55   Temp 97.8 F (36.6 C) (Oral)   Ht 5\' 7"  (1.702 m)   Wt 240 lb (108.9 kg)   BMI 37.59 kg/m   KOH prep on rash behind ears is pending=== Lab work is pending===      Assessment & Plan:  1. Type 2 diabetes mellitus with stage 1 chronic kidney disease and hypertension ([place]) -Continue with aggressive therapeutic lifestyle changes.  Walk and exercise regularly.  2. Gastroesophageal reflux disease, esophagitis presence not specified -Continue with current treatment and avoid fried foods   3. Essential hypertension -The blood pressure is good today and he will continue with current treatment  4. Vitamin D deficiency -Continue vitamin D replacement pending results  of lab work  5. Benign prostatic hyperplasia, unspecified whether lower urinary tract symptoms present -No symptoms today with voiding.  6. Hyperlipidemia associated with type 2 diabetes mellitus ([place]) -The patient has been restarted on Crestor and previously had to stop this due to muscle aches and myalgias.  He would certainly be a candidate for PC SK 9 inhibitor.  He will also be a good candidate for Vascepa.  We will wait until lab work is returned and then make decisions about these medications at that time.  7. Anxiety -BuSpar 15 mg twice daily and consider raising it to 30 mg twice daily if needed  8. Morbid obesity ([place]) -Work aggressively on weight with diet and exercise  9. Status post angioplasty with stent -Follow-up with cardiology as planned  Meds ordered this encounter  Medications  . glucose blood (CONTOUR NEXT TEST) test strip    Sig: Check BS qd and PRN dx E11.9    Dispense:  100 each    Refill:  11    E11.9  . busPIRone  (BUSPAR) 15 MG tablet    Sig: Take 1 tablet (15 mg total) by mouth 2 (two) times daily as needed.    Dispense:  60 tablet    Refill:  1   Patient Instructions  Continue current medications. Continue good therapeutic lifestyle changes which include good diet and exercise. Fall precautions discussed with patient. If an FOBT was given today- please return it to our front desk. If you are over 65 years old - you may need Prevnar 52 or the adult Pneumonia vaccine.  **Flu shots are available--- please call and schedule a FLU-CLINIC appointment**  After your visit with [place] today you will receive a survey in the mail or online from [employer] regarding your care with [place]. Please take a moment to fill this out. Your feedback is very important to [place] as you can help [place] better understand your patient needs as well as improve your experience and satisfaction. WE CARE ABOUT YOU!!!   Start walking Drink more water and fluids Avoid sugar Eat foods that are more baked in broiled and not fried Eat more fruits and vegetables Only use artificial sweetener like Stevia or truvia Take BuSpar if needed for anxiety as directed Follow-up with cardiology as planned KOH prep on rash behind ears Return to the office for fasting lab work or get this drawn at home  Arrie Senate MD

## 2017-12-18 ENCOUNTER — Encounter (HOSPITAL_COMMUNITY): Payer: Self-pay

## 2017-12-18 ENCOUNTER — Telehealth: Payer: Self-pay | Admitting: Cardiology

## 2017-12-18 MED ORDER — CEFDINIR 300 MG PO CAPS: 300.0000 mg | ORAL_CAPSULE | Freq: Two times a day (BID) | ORAL | 0 refills | Status: DC

## 2017-12-18 MED ORDER — NYSTATIN-TRIAMCINOLONE 100000-0.1 UNIT/GM-% EX OINT: 1.0000 "application " | TOPICAL_OINTMENT | Freq: Two times a day (BID) | CUTANEOUS | 0 refills | Status: DC

## 2017-12-18 NOTE — Telephone Encounter (Signed)
Pt needs a note for work on [employer] will give you the details.

## 2017-12-18 NOTE — Addendum Note (Signed)
Addended by: Zannie Cove on: 12/18/2017 04:00 PM   Modules accepted: Orders

## 2017-12-18 NOTE — Addendum Note (Signed)
Addended by: Zannie Cove on: 12/18/2017 04:07 PM   Modules accepted: Orders

## 2017-12-18 NOTE — Telephone Encounter (Signed)
Returned call to patient's wife.She stated husband needed a letter to return to work Monday 12/21/17 with no restrictions.Dr.Jordan advised ok to return to work 2/25 with no restrictions.Letter faxed to her at fax # [phone].

## 2017-12-19 LAB — CBC WITH DIFFERENTIAL/PLATELET
BASOS ABS: 0 10*3/uL (ref 0.0–0.2)
Basos: 1 %
EOS (ABSOLUTE): 0.2 10*3/uL (ref 0.0–0.4)
Eos: 3 %
Hematocrit: 44.2 % (ref 37.5–51.0)
Hemoglobin: 15.3 g/dL (ref 13.0–17.7)
IMMATURE GRANS (ABS): 0 10*3/uL (ref 0.0–0.1)
Immature Granulocytes: 1 %
LYMPHS: 33 %
Lymphocytes Absolute: 2.1 10*3/uL (ref 0.7–3.1)
MCH: 32.5 pg (ref 26.6–33.0)
MCHC: 34.6 g/dL (ref 31.5–35.7)
MCV: 94 fL (ref 79–97)
MONOCYTES: 8 %
Monocytes Absolute: 0.5 10*3/uL (ref 0.1–0.9)
Neutrophils Absolute: 3.5 10*3/uL (ref 1.4–7.0)
Neutrophils: 54 %
PLATELETS: 286 10*3/uL (ref 150–379)
RBC: 4.71 x10E6/uL (ref 4.14–5.80)
RDW: 13.8 % (ref 12.3–15.4)
WBC: 6.4 10*3/uL (ref 3.4–10.8)

## 2017-12-19 LAB — BMP8+EGFR
BUN/Creatinine Ratio: 12 (ref 9–20)
BUN: 11 mg/dL (ref 6–24)
CALCIUM: 9.7 mg/dL (ref 8.7–10.2)
CO2: 22 mmol/L (ref 20–29)
CREATININE: 0.89 mg/dL (ref 0.76–1.27)
Chloride: 102 mmol/L (ref 96–106)
GFR calc non Af Amer: 99 mL/min/{1.73_m2} (ref >59)
GFR, EST AFRICAN AMERICAN: 114 mL/min/{1.73_m2} (ref >59)
Glucose: 122 mg/dL — ABNORMAL HIGH (ref 65–99)
POTASSIUM: 4.9 mmol/L (ref 3.5–5.2)
Sodium: 141 mmol/L (ref 134–144)

## 2017-12-19 LAB — HEPATIC FUNCTION PANEL
ALT: 29 IU/L (ref 0–44)
AST: 13 IU/L (ref 0–40)
Albumin: 4.5 g/dL (ref 3.5–5.5)
Alkaline Phosphatase: 65 IU/L (ref 39–117)
BILIRUBIN, DIRECT: 0.15 mg/dL (ref 0.00–0.40)
Bilirubin Total: 0.6 mg/dL (ref 0.0–1.2)
TOTAL PROTEIN: 7 g/dL (ref 6.0–8.5)

## 2017-12-19 LAB — LIPID PANEL
CHOL/HDL RATIO: 6.3 ratio — AB (ref 0.0–5.0)
Cholesterol, Total: 190 mg/dL (ref 100–199)
HDL: 30 mg/dL — ABNORMAL LOW (ref >39)
LDL CALC: 89 mg/dL (ref 0–99)
TRIGLYCERIDES: 354 mg/dL — AB (ref 0–149)
VLDL CHOLESTEROL CAL: 71 mg/dL — AB (ref 5–40)

## 2017-12-19 LAB — VITAMIN D 25 HYDROXY (VIT D DEFICIENCY, FRACTURES): VIT D 25 HYDROXY: 27.8 ng/mL — AB (ref 30.0–100.0)

## 2017-12-20 ENCOUNTER — Encounter: Payer: Self-pay | Admitting: Family Medicine

## 2017-12-20 LAB — MICROALBUMIN / CREATININE URINE RATIO
Creatinine, Urine: 38.2 mg/dL
Microalb/Creat Ratio: 36.4 mg/g creat — ABNORMAL HIGH (ref 0.0–30.0)
Microalbumin, Urine: 13.9 ug/mL

## 2017-12-21 ENCOUNTER — Other Ambulatory Visit: Payer: Self-pay

## 2017-12-21 ENCOUNTER — Telehealth: Payer: Self-pay

## 2017-12-21 MED ORDER — METOPROLOL SUCCINATE ER 25 MG PO TB24: 25.0000 mg | ORAL_TABLET | Freq: Every day | ORAL | 1 refills | Status: DC

## 2017-12-21 NOTE — Telephone Encounter (Signed)
Can Metformin be changed to Metformin ER due to kidneys

## 2017-12-21 NOTE — Telephone Encounter (Signed)
DWM  Does the dose and directions stay the same as the reg. Metformin??

## 2017-12-21 NOTE — Addendum Note (Signed)
Addended by: Zannie Cove on: 12/21/2017 08:14 AM   Modules accepted: Orders

## 2017-12-21 NOTE — Telephone Encounter (Signed)
Yes

## 2017-12-22 LAB — THYROID PANEL WITH TSH
FREE THYROXINE INDEX: 1.8 (ref 1.2–4.9)
T3 Uptake Ratio: 26 % (ref 24–39)
T4, Total: 6.8 ug/dL (ref 4.5–12.0)
TSH: 4.92 u[IU]/mL — AB (ref 0.450–4.500)

## 2017-12-22 LAB — SPECIMEN STATUS REPORT

## 2017-12-22 NOTE — Telephone Encounter (Signed)
Pt aware of recommendation °

## 2017-12-22 NOTE — Addendum Note (Signed)
Addended by: Earlene Plater on: 12/22/2017 10:46 AM   Modules accepted: Orders

## 2017-12-22 NOTE — Telephone Encounter (Signed)
The patient only had micro albumin slightly elevated.  The creatinine was normal.  His liver function tests were normal.  He can change to the metformin at 500 extended release but the plain metformin would be fine with him the microalbumin elevation is very minimal and we will continue to monitor this.

## 2017-12-23 ENCOUNTER — Encounter: Payer: Self-pay | Admitting: Physician Assistant

## 2017-12-23 ENCOUNTER — Telehealth (HOSPITAL_COMMUNITY): Payer: Self-pay

## 2017-12-23 ENCOUNTER — Ambulatory Visit: Payer: 59 | Admitting: Physician Assistant

## 2017-12-23 VITALS — BP 118/66 | HR 60 | Ht 67.0 in | Wt 245.0 lb

## 2017-12-23 DIAGNOSIS — Z955 Presence of coronary angioplasty implant and graft: Secondary | ICD-10-CM

## 2017-12-23 DIAGNOSIS — Z9989 Dependence on other enabling machines and devices: Secondary | ICD-10-CM | POA: Diagnosis not present

## 2017-12-23 DIAGNOSIS — E119 Type 2 diabetes mellitus without complications: Secondary | ICD-10-CM | POA: Diagnosis not present

## 2017-12-23 DIAGNOSIS — I10 Essential (primary) hypertension: Secondary | ICD-10-CM

## 2017-12-23 DIAGNOSIS — E785 Hyperlipidemia, unspecified: Secondary | ICD-10-CM | POA: Diagnosis not present

## 2017-12-23 DIAGNOSIS — I2510 Atherosclerotic heart disease of native coronary artery without angina pectoris: Secondary | ICD-10-CM

## 2017-12-23 DIAGNOSIS — G4733 Obstructive sleep apnea (adult) (pediatric): Secondary | ICD-10-CM

## 2017-12-23 LAB — SPECIMEN STATUS REPORT

## 2017-12-23 LAB — HGB A1C W/O EAG: Hgb A1c MFr Bld: 6.7 % — ABNORMAL HIGH (ref 4.8–5.6)

## 2017-12-23 MED ORDER — TICAGRELOR 90 MG PO TABS: 90.0000 mg | ORAL_TABLET | Freq: Two times a day (BID) | ORAL | 0 refills | Status: DC

## 2017-12-23 NOTE — Telephone Encounter (Signed)
Close encounter 

## 2017-12-23 NOTE — Telephone Encounter (Signed)
Called to speak with patient in regards to Cardiac Rehab - Patient stated he is currently at the Dr right now and would like for me to call back tomorrow.

## 2017-12-23 NOTE — Patient Instructions (Signed)
Medication Instructions:  STOP Plavix  START Brilinta Tomorrow morning take 2 tablets (180 mg) Tomorrow night take 1 tablet (90 mg ) Continue taking 1 tablet (90 mg) two times daily  Labwork: Please return for FASTING labs in 6-8 weeks (Lipid, hepatic)  Follow-Up: 3 months with Dr. Claiborne Billings  Any Other Special Instructions Will Be Listed Below (If Applicable).     If you need a refill on your cardiac medications before your next appointment, please call your pharmacy.

## 2017-12-23 NOTE — Progress Notes (Signed)
Cardiology Office Note    Date:  12/25/2017   ID:  Jimmy Craft., DOB 1966/09/07, MRN [id number]  PCP:  [name], MD  Cardiologist:  Dr. Claiborne Billings  Chief Complaint  Patient presents with  . Follow-up    seen for Dr. Claiborne Billings, post PCI    History of Present Illness:  Jimmy Markale Birdsell. is a 52 y.o. male with PMH of HTN, HLD, OSA on CPAP, and diabetes. ETT on 10/04/2015 showed no ischemic changes, normal stress test.  His younger brother had first MI at age 18.  His father had 3 stents by age 56.  He never smoked himself, he drank about 5-6 beers on the weekend.  Otherwise he has been compliant with his CPAP machine.    I last saw the patient on 12/09/2017 for exertional chest pain.  I discussed the case with Dr. Claiborne Billings who recommended cardiac catheterization.  Cardiac catheterization performed on 12/14/2017 showed single-vessel disease with 90% OM1 lesion treated with synergy 3.0 x 12 mm DES.  He has no other residual disease.  Post cath, patient was placed on aspirin and Plavix.  Patient presents today to cardiology office visit for post cardiac catheterization follow-up.  He denies any further chest pain or shortness of breath.  He has been doing very well from cardiology perspective.  However on the inside of his elbow, he has a significant rash.  The only new medication is Plavix.  After review alternatives, we switched his Plavix to Brilinta.  He will take 180 mg loading dose tomorrow morning and then started on 90 mg twice daily of Brilinta.  We have given him a coupon card as well.  Otherwise he has no lower extremity edema, orthopnea or PND.    Past Medical History:  Diagnosis Date  . Borderline diabetes    no meds, watching diet.  . Chronic kidney disease   . Diabetes mellitus without complication ([place])   . GERD (gastroesophageal reflux disease)   . Hyperlipidemia   . Hypertension   . Kidney stones   . OSA (obstructive sleep apnea)    Cpap use--study 12-26-14 Epic  suggestive of this.  . Pre-diabetes    "per patient"  . Vitamin D deficiency     Past Surgical History:  Procedure Laterality Date  . CORONARY STENT INTERVENTION N/A 12/14/2017   Procedure: CORONARY STENT INTERVENTION;  Surgeon: [name], Peter M, MD;  Location: [facility];  Service: Cardiovascular;  Laterality: N/A;  . CYSTOSCOPY WITH RETROGRADE PYELOGRAM, URETEROSCOPY AND STENT PLACEMENT Right 05/29/2017   Procedure: CYSTOSCOPY WITH RETROGRADE PYELOGRAM, URETEROSCOPY AND STENT PLACEMENT;  Surgeon: [name], MD;  Location: [facility];  Service: Urology;  Laterality: Right;  . EUS N/A 02/08/2015   Procedure: UPPER ENDOSCOPIC ULTRASOUND (EUS) LINEAR;  Surgeon: [name], MD;  Location: [facility];  Service: Endoscopy;  Laterality: N/A;  . EUS N/A 09/25/2016   Procedure: UPPER ENDOSCOPIC ULTRASOUND (EUS) RADIAL;  Surgeon: [name], MD;  Location: [facility];  Service: Endoscopy;  Laterality: N/A;  . HOLMIUM LASER APPLICATION Right 0/10/7508   Procedure: HOLMIUM LASER APPLICATION;  Surgeon: [name], MD;  Location: [facility];  Service: Urology;  Laterality: Right;  . KNEE ARTHROSCOPY Right 11/28/95   [name])  . LEFT HEART CATH AND CORONARY ANGIOGRAPHY N/A 12/14/2017   Procedure: LEFT HEART CATH AND CORONARY ANGIOGRAPHY;  Surgeon: [name], Peter M, MD;  Location: [facility];  Service: Cardiovascular;  Laterality: N/A;  Current Medications: Outpatient Medications Prior to Visit  Medication Sig Dispense Refill  . amLODipine (NORVASC) 5 MG tablet Take 1 tablet by mouth  daily (Patient taking differently: Take 5 mg by mouth daily. ) 90 tablet 3  . aspirin EC 81 MG tablet Take 81 mg by mouth daily.    . busPIRone (BUSPAR) 15 MG tablet Take 1 tablet (15 mg total) by mouth 2 (two) times daily as needed. 60 tablet 1  . cefdinir (OMNICEF) 300 MG capsule Take 1 capsule (300 mg total) by mouth 2 (two) times daily. 1 po BID 20 capsule 0  . colchicine 0.6 MG tablet  Take 1 tablet (0.6 mg total) by mouth daily. May repeat in 2 hours x1 in 24hours (Patient taking differently: Take 0.6 mg by mouth daily as needed (for gout). May repeat in 2 hours x1 in 24hours) 20 tablet 1  . cyclobenzaprine (FLEXERIL) 10 MG tablet Take 1 tablet (10 mg total) by mouth 3 (three) times daily as needed for muscle spasms. 30 tablet 1  . glucose blood (CONTOUR NEXT TEST) test strip Check BS qd and PRN dx E11.9 100 each 11  . Lancets MISC 1 Stick by Does not apply route daily. 100 each 2  . losartan (COZAAR) 50 MG tablet Take 1 tablet (50 mg total) by mouth daily. 90 tablet 0  . metFORMIN (GLUCOPHAGE) 500 MG tablet Take 1 tablet (500 mg total) by mouth daily with breakfast. 90 tablet 3  . metoprolol succinate (TOPROL-XL) 25 MG 24 hr tablet Take 1 tablet (25 mg total) by mouth daily. 90 tablet 1  . nitroGLYCERIN (NITROSTAT) 0.4 MG SL tablet Place 1 tablet (0.4 mg total) under the tongue every 5 (five) minutes as needed for chest pain. 25 tablet 1  . nystatin-triamcinolone ointment (MYCOLOG) Apply 1 application topically 2 (two) times daily. 30 g 0  . Omega-3 Fatty Acids (FISH OIL) 1000 MG CAPS Take 1,000 mg by mouth daily.     . pantoprazole (PROTONIX) 40 MG tablet Take 1 tablet (40 mg total) by mouth daily. 30 tablet 1  . rosuvastatin (CRESTOR) 20 MG tablet Take 1 tablet (20 mg total) by mouth at bedtime. 30 tablet 1  . Vitamin D, Ergocalciferol, (DRISDOL) 50000 units CAPS capsule Take 1 capsule (50,000 Units total) by mouth every 7 (seven) days. (Patient taking differently: Take 50,000 Units by mouth every Monday. ) 12 capsule 3  . clopidogrel (PLAVIX) 75 MG tablet Take 1 tablet (75 mg total) by mouth daily. 90 tablet 2   No facility-administered medications prior to visit.      Allergies:   Patient has no known allergies.   Social History   Socioeconomic History  . Marital status: Married    Spouse name: Tye [place]   . Number of children: 2  . Years of education: None  . Highest  education level: None  Social Needs  . Financial resource strain: None  . Food insecurity - worry: None  . Food insecurity - inability: None  . Transportation needs - medical: None  . Transportation needs - non-medical: None  Occupational History  . Occupation: Financial trader  Tobacco Use  . Smoking status: Never Smoker  . Smokeless tobacco: Current User    Types: Snuff  . Tobacco comment: off an on (pouches) of snuff  Substance and Sexual Activity  . Alcohol use: Yes    Alcohol/week: 3.6 oz    Types: 6 Cans of beer per week    Comment: occasional- depending on  the occasion  . Drug use: No  . Sexual activity: None  Other Topics Concern  . None  Social History Narrative  . None     Family History:  The patient's family history includes Colon polyps in his brother; Diabetes in his father, mother, and sister; Heart attack (age of onset: 61) in his brother; Heart disease in his father; Hyperlipidemia in his brother and father; Hypertension in his brother, father, and sister.   ROS:   Please see the history of present illness.    ROS All other systems reviewed and are negative.   PHYSICAL EXAM:   VS:  BP 118/66   Pulse 60   Ht 5\' 7"  (1.702 m)   Wt 245 lb (111.1 kg)   BMI 38.37 kg/m    GEN: Well nourished, well developed, in no acute distress  HEENT: normal  Neck: no JVD, carotid bruits, or masses Cardiac: RRR; no murmurs, rubs, or gallops,no edema  Respiratory:  clear to auscultation bilaterally, normal work of breathing GI: soft, nontender, nondistended, + BS MS: no deformity or atrophy  Skin: warm and dry, no rash Neuro:  Alert and Oriented x 3, Strength and sensation are intact Psych: euthymic mood, full affect  Wt Readings from Last 3 Encounters:  12/23/17 245 lb (111.1 kg)  12/17/17 240 lb (108.9 kg)  12/14/17 235 lb (106.6 kg)      Studies/Labs Reviewed:   EKG:  EKG is ordered today.  The ekg ordered today demonstrates NSR without nonspecific T wave  changes  Recent Labs: 12/18/2017: ALT 29; BUN 11; Creatinine, Ser 0.89; Hemoglobin 15.3; Platelets 286; Potassium 4.9; Sodium 141; TSH 4.920   Lipid Panel    Component Value Date/Time   CHOL 190 12/18/2017 1604   TRIG 354 (H) 12/18/2017 1604   HDL 30 (L) 12/18/2017 1604   CHOLHDL 6.3 (H) 12/18/2017 1604   CHOLHDL 5.4 04/18/2013 0916   VLDL 76 (H) 04/18/2013 0916   LDLCALC 89 12/18/2017 1604   LDLDIRECT 88 06/06/2015 1527    Additional studies/ records that were reviewed today include:   Cath 12/14/2017 Conclusion     Ost 1st Mrg lesion is 90% stenosed.  Post intervention, there is a 0% residual stenosis.  A drug-eluting stent was successfully placed using a STENT SYNERGY DES 3X12.  The left ventricular systolic function is normal.  LV end diastolic pressure is normal.  The left ventricular ejection fraction is 55-65% by visual estimate.   1. Single vessel obstructive CAD 2. Normal LV function 3. Normal LVEDP 4. Successful PCI of the first OM with DES  Plan: DAPT for one year. High intensity statin. Resume Metformin in 2 days. Patient is a candidate for same day discharge.        ASSESSMENT:    1. Coronary artery disease involving native coronary artery of native heart without angina pectoris   2. Status post coronary artery stent placement   3. Hyperlipidemia, unspecified hyperlipidemia type   4. Essential hypertension   5. OSA on CPAP   6. Controlled type 2 diabetes mellitus without complication, without long-term current use of insulin (HCC)      PLAN:  In order of problems listed above:  1. CAD: Recently underwent DES to OM, since discharge he has a significant rash, the only new medication is the Plavix and Protonix, I think likely culprit is the Plavix.  I have switched his Plavix to Brilinta after a loading dose.  He will contact us in 2 weeks if  the rash does not go away.  2. Hypertension: Blood pressure well controlled  3. Hyperlipidemia: On  Crestor 20 mg daily.  Fasting lipid panel and LFT in 6-8 weeks  4. Obstructive sleep apnea: Continue CPAP  5. DM 2: Managed by primary care provider    Medication Adjustments/Labs and Tests Ordered: Current medicines are reviewed at length with the patient today.  Concerns regarding medicines are outlined above.  Medication changes, Labs and Tests ordered today are listed in the Patient Instructions below. Patient Instructions  Medication Instructions:  STOP Plavix  START Brilinta Tomorrow morning take 2 tablets (180 mg) Tomorrow night take 1 tablet (90 mg ) Continue taking 1 tablet (90 mg) two times daily  Labwork: Please return for FASTING labs in 6-8 weeks (Lipid, hepatic)  Follow-Up: 3 months with Dr. Claiborne Billings  Any Other Special Instructions Will Be Listed Below (If Applicable).     If you need a refill on your cardiac medications before your next appointment, please call your pharmacy.      Hilbert Corrigan, [place]  12/25/2017 2:12 PM    [facility] [place], [place], [place]  [address] Phone: [phone]; Fax: [phone]

## 2017-12-25 ENCOUNTER — Encounter: Payer: Self-pay | Admitting: Physician Assistant

## 2017-12-28 ENCOUNTER — Telehealth (HOSPITAL_COMMUNITY): Payer: Self-pay

## 2017-12-28 NOTE — Telephone Encounter (Signed)
Called to speak with patient in regards to Cardiac Rehab - patient stated right now is not a good time due to work. He would like for me to give him a call back in about a month.

## 2017-12-28 NOTE — Addendum Note (Signed)
Addended by: Ulice Brilliant T on: 12/28/2017 08:51 AM   Modules accepted: Orders

## 2018-01-08 ENCOUNTER — Ambulatory Visit: Payer: 59 | Admitting: Internal Medicine

## 2018-01-14 ENCOUNTER — Encounter: Payer: Self-pay | Admitting: Physician Assistant

## 2018-01-15 NOTE — Telephone Encounter (Signed)
Please refill his crestor and protonix and send them to [facility], inform patient ok to try plavix again, however when switch to plavix, he will need a loading dose again by taking 300mg  x 1 loading dose (which equal to 4 tablets of the 75mg ) then 75mg  daily thereafter

## 2018-01-19 ENCOUNTER — Other Ambulatory Visit: Payer: Self-pay | Admitting: Physician Assistant

## 2018-01-19 ENCOUNTER — Other Ambulatory Visit: Payer: Self-pay

## 2018-01-19 ENCOUNTER — Encounter: Payer: Self-pay | Admitting: Internal Medicine

## 2018-01-19 ENCOUNTER — Telehealth: Payer: Self-pay

## 2018-01-19 MED ORDER — ROSUVASTATIN CALCIUM 20 MG PO TABS: 20.0000 mg | ORAL_TABLET | Freq: Every day | ORAL | 0 refills | Status: DC

## 2018-01-19 MED ORDER — CLOPIDOGREL BISULFATE 75 MG PO TABS: 75.0000 mg | ORAL_TABLET | Freq: Every day | ORAL | 3 refills | Status: DC

## 2018-01-19 NOTE — Telephone Encounter (Signed)
Called Jimmy Perez and told him I needed to discuss his medication he requested that I contact his wife, Jimmy Perez because she is a Marine scientist.  Left message for Jimmy Perez to contact office.         Per HAO MENG, PA-C: Please refill his crestor and protonix and send them to [facility], inform patient ok to try plavix again, however when switch to plavix, he will need a loading dose again by taking 300mg  x 1 loading dose (which equal to 4 tablets of the 75mg ) then 75mg  daily thereafter  ----- Message ----- From: Valora Corporal, RN Sent: 01/14/2018   2:10 PM To: Almyra Deforest, PA Subject: FW: Non-Urgent Medical Question                ----- Message ----- From: [name] Sent: 01/14/2018   2:07 PM To: Rebeca Alert Burl Triage Subject: Non-Urgent Medical Question                   ----- Message from [place], Generic sent at 01/14/2018  2:07 PM EDT -----  Could I try the Plavix again. I forgot that I started on an antibiotic a few days later for that rash behind my ears which I was wondering could have caused the itching instead, I am wondering because I have missed a couple of evening doses of the Brintilla. Can refills for the Crestor & pantoprazole be sent to [facility], unless there is something cheaper than the Crestor.

## 2018-01-19 NOTE — Telephone Encounter (Signed)
Spoke with wife, will switch Brilinta back plavix after a loading dose of 300 mg tomorrow afternoon. Then start on 75mg  daily of plavix after that.

## 2018-01-19 NOTE — Telephone Encounter (Signed)
Wife returning call. Jimmy Perez will speak with wife.

## 2018-01-20 ENCOUNTER — Other Ambulatory Visit: Payer: Self-pay | Admitting: *Deleted

## 2018-01-20 MED ORDER — PANTOPRAZOLE SODIUM 40 MG PO TBEC: 40.0000 mg | DELAYED_RELEASE_TABLET | Freq: Every day | ORAL | 3 refills | Status: DC

## 2018-01-21 ENCOUNTER — Ambulatory Visit: Payer: 59 | Admitting: Internal Medicine

## 2018-01-21 ENCOUNTER — Encounter: Payer: Self-pay | Admitting: Internal Medicine

## 2018-01-21 VITALS — BP 130/90 | HR 67 | Ht 68.0 in | Wt 242.6 lb

## 2018-01-21 DIAGNOSIS — G4733 Obstructive sleep apnea (adult) (pediatric): Secondary | ICD-10-CM | POA: Diagnosis not present

## 2018-01-21 DIAGNOSIS — I209 Angina pectoris, unspecified: Secondary | ICD-10-CM | POA: Diagnosis not present

## 2018-01-21 NOTE — Progress Notes (Signed)
HPI male nonsmoker followed for OSA, complicated by HBP, GERD, obesity, CAD/ stent Unattended home sleep study 01/05/15 AHI 77/ hr, desat to 73%, weight 253 lbs   -----------------------------------------------------------------------------------------.  01/19/2017-52 year old male nonsmoker followed for OSA, complicated by HBP, GERD, obesity CPAP auto 8-15/[facility] FOLLOWS FOR:DME:[facility] Pt continues to wear CPAP nightly and DL attached. Rx new supplies needed at this time.  Download 73%/4 hours, AHI 1.2/hour Feels very comfortable with CPAP and with this pressure range. Has not bothered to take it with him a couple of times going out of town for short trips.  01/21/18- 52 year old male nonsmoker followed for OSA, complicated by HBP, GERD, obesity, CAD/ stent CPAP auto 8-15/[facility]>> today auto 8-20 ----OSA: DME:[facility]. Pt wears CPAP nightly and DL attached. Pressure works well for patient.  No new supplies needed at this time.  Has had a stent placed for coronary disease without MI. He would like to try a little higher CPAP pressure which we discussed. Download 90% compliance AHI 1.8/hour.  He sleeps better with CPAP.  ROS-see HPI   + = positive Constitutional:   No-   weight loss, night sweats, fevers, chills, +fatigue, lassitude. HEENT:   No-  headaches, difficulty swallowing, tooth/dental problems, sore throat,       No-  sneezing, itching, ear ache, nasal congestion, post nasal drip,  CV:  No-   chest pain, orthopnea, PND, swelling in lower extremities, anasarca,                                                    dizziness, palpitations Resp: No-   shortness of breath with exertion or at rest.              No-   productive cough,  No non-productive cough,  No- coughing up of blood.              No-   change in color of mucus.  No- wheezing.   Skin: No-   rash or lesions. GI:  +heartburn, indigestion, No-abdominal pain, nausea, vomiting,  GU:  MS:  No-   joint pain or swelling.   Neuro-      nothing unusual Psych:  No- change in mood or affect. No depression or anxiety.  No memory loss.  OBJ- Physical Exam     +obese General- Alert, Oriented, Affect-appropriate, Distress- none acute,  Skin- rash-none, lesions- none, excoriation- none Lymphadenopathy- none Head- atraumatic            Eyes- Gross vision intact, PERRLA, conjunctivae and secretions clear            Ears- Hearing, canals-normal            Nose- Clear, no-Septal dev, mucus, polyps, erosion, perforation             Throat- Mallampati IV , mucosa clear , drainage- none, tonsils- atrophic, + hoarse Neck- flexible , trachea midline, no stridor , thyroid nl, carotid no bruit Chest - symmetrical excursion , unlabored           Heart/CV- RRR , no murmur , no gallop  , no rub, nl s1 s2                           - JVD- none , edema- none, stasis changes- none, varices- none  Lung- clear to P&A, wheeze- none, cough- none , dullness-none, rub- none           Chest wall-  Abd-  Br/ Gen/ Rectal- Not done, not indicated Extrem- cyanosis- none, clubbing, none, atrophy- none, strength- nl Neuro- grossly intact to observation

## 2018-01-21 NOTE — Patient Instructions (Signed)
Order- DME [facility] please change auto range to 8-20, continue mask of choice, humidifier, supplies, AirView  Please call if we can help

## 2018-01-24 NOTE — Assessment & Plan Note (Signed)
Denies chest pain after stent.  Followed by cardiology.

## 2018-01-24 NOTE — Assessment & Plan Note (Signed)
Sent control.  I am not sure it will make a subjective difference but we are going to let him try increased AutoPap range to 8-20.

## 2018-02-01 ENCOUNTER — Telehealth (HOSPITAL_COMMUNITY): Payer: Self-pay

## 2018-02-01 NOTE — Telephone Encounter (Signed)
Called to follow up with patient in regards to cardiac rehab - patient stated he is still in transition with his job for the next 3 weeks. Will follow up in a month per patient.

## 2018-02-08 ENCOUNTER — Other Ambulatory Visit: Payer: Self-pay | Admitting: *Deleted

## 2018-02-08 DIAGNOSIS — R5383 Other fatigue: Secondary | ICD-10-CM

## 2018-02-08 DIAGNOSIS — Z0000 Encounter for general adult medical examination without abnormal findings: Secondary | ICD-10-CM

## 2018-02-17 ENCOUNTER — Ambulatory Visit: Payer: 59 | Admitting: Family Medicine

## 2018-02-18 ENCOUNTER — Other Ambulatory Visit: Payer: 59

## 2018-02-18 DIAGNOSIS — R5383 Other fatigue: Secondary | ICD-10-CM

## 2018-02-18 DIAGNOSIS — Z0000 Encounter for general adult medical examination without abnormal findings: Secondary | ICD-10-CM

## 2018-02-19 LAB — HEPATIC FUNCTION PANEL
ALT: 33 IU/L (ref 0–44)
AST: 22 IU/L (ref 0–40)
Albumin: 5.1 g/dL (ref 3.5–5.5)
Alkaline Phosphatase: 72 IU/L (ref 39–117)
BILIRUBIN TOTAL: 0.6 mg/dL (ref 0.0–1.2)
Bilirubin, Direct: 0.17 mg/dL (ref 0.00–0.40)
Total Protein: 7.4 g/dL (ref 6.0–8.5)

## 2018-02-19 LAB — SPECIMEN STATUS REPORT

## 2018-02-19 LAB — LIPID PANEL
Chol/HDL Ratio: 4.3 ratio (ref 0.0–5.0)
Cholesterol, Total: 163 mg/dL (ref 100–199)
HDL: 38 mg/dL — ABNORMAL LOW (ref >39)
Triglycerides: 509 mg/dL — ABNORMAL HIGH (ref 0–149)

## 2018-02-19 LAB — THYROID PANEL WITH TSH
FREE THYROXINE INDEX: 1.4 (ref 1.2–4.9)
T3 UPTAKE RATIO: 23 % — AB (ref 24–39)
T4 TOTAL: 6.2 ug/dL (ref 4.5–12.0)
TSH: 4.3 u[IU]/mL (ref 0.450–4.500)

## 2018-02-22 ENCOUNTER — Telehealth: Payer: Self-pay | Admitting: Cardiovascular Disease

## 2018-02-22 ENCOUNTER — Encounter: Payer: Self-pay | Admitting: Family Medicine

## 2018-02-22 NOTE — Telephone Encounter (Signed)
New message    Patient calling to discuss lab results

## 2018-02-22 NOTE — Telephone Encounter (Signed)
Spoke with wife per DPR and gave her lab results; she voiced understanding.

## 2018-03-05 ENCOUNTER — Ambulatory Visit (INDEPENDENT_AMBULATORY_CARE_PROVIDER_SITE_OTHER): Payer: No Typology Code available for payment source | Admitting: Family Medicine

## 2018-03-05 ENCOUNTER — Encounter: Payer: Self-pay | Admitting: Family Medicine

## 2018-03-05 VITALS — BP 131/76 | HR 59 | Temp 97.1°F | Ht 68.0 in | Wt 242.0 lb

## 2018-03-05 DIAGNOSIS — Z23 Encounter for immunization: Secondary | ICD-10-CM | POA: Diagnosis not present

## 2018-03-05 DIAGNOSIS — S61211A Laceration without foreign body of left index finger without damage to nail, initial encounter: Secondary | ICD-10-CM | POA: Diagnosis not present

## 2018-03-05 MED ORDER — DOXYCYCLINE HYCLATE 100 MG PO TABS: 100.0000 mg | ORAL_TABLET | Freq: Two times a day (BID) | ORAL | 0 refills | Status: AC

## 2018-03-05 NOTE — Progress Notes (Signed)
Subjective: CC: Laceration PCP: [name], MD WIO:[id number] Artist Bloom. is a 52 y.o. male presenting to clinic today for:  1.  Laceration Patient reports that he sustained a laceration to his left index finger yesterday afternoon.  He notes that he was tossing fruit over a fence and hit his hand against a tin roof of the chicken coop.  He has been keeping the wound clean with soap and water and had a pressure bandage in place.  The wound continues to ooze and is painful.  His wife is a Marine scientist here at the practice and has a tetanus shot in the refrigerator at home for him but has not administrated.  Last tetanus was in 2011.  He took an oxycodone last night that he had leftover from his kidney stone for pain.   ROS: Per HPI  No Known Allergies Past Medical History:  Diagnosis Date  . Borderline diabetes    no meds, watching diet.  . Chronic kidney disease   . Diabetes mellitus without complication ([place])   . GERD (gastroesophageal reflux disease)   . Hyperlipidemia   . Hypertension   . Kidney stones   . OSA (obstructive sleep apnea)    Cpap use--study 12-26-14 Epic suggestive of this.  . Pre-diabetes    "per patient"  . Vitamin D deficiency     Current Outpatient Medications:  .  amLODipine (NORVASC) 5 MG tablet, Take 1 tablet by mouth  daily (Patient taking differently: Take 5 mg by mouth daily. ), Disp: 90 tablet, Rfl: 3 .  aspirin EC 81 MG tablet, Take 81 mg by mouth daily., Disp: , Rfl:  .  busPIRone (BUSPAR) 15 MG tablet, Take 1 tablet (15 mg total) by mouth 2 (two) times daily as needed., Disp: 60 tablet, Rfl: 1 .  clopidogrel (PLAVIX) 75 MG tablet, Take 1 tablet (75 mg total) by mouth daily., Disp: 90 tablet, Rfl: 3 .  colchicine 0.6 MG tablet, Take 1 tablet (0.6 mg total) by mouth daily. May repeat in 2 hours x1 in 24hours (Patient taking differently: Take 0.6 mg by mouth daily as needed (for gout). May repeat in 2 hours x1 in 24hours), Disp: 20 tablet, Rfl: 1 .   cyclobenzaprine (FLEXERIL) 10 MG tablet, Take 1 tablet (10 mg total) by mouth 3 (three) times daily as needed for muscle spasms., Disp: 30 tablet, Rfl: 1 .  glucose blood (CONTOUR NEXT TEST) test strip, Check BS qd and PRN dx E11.9, Disp: 100 each, Rfl: 11 .  Lancets MISC, 1 Stick by Does not apply route daily., Disp: 100 each, Rfl: 2 .  losartan (COZAAR) 50 MG tablet, Take 1 tablet (50 mg total) by mouth daily., Disp: 90 tablet, Rfl: 0 .  metFORMIN (GLUCOPHAGE) 500 MG tablet, Take 1 tablet (500 mg total) by mouth daily with breakfast., Disp: 90 tablet, Rfl: 3 .  metoprolol succinate (TOPROL-XL) 25 MG 24 hr tablet, Take 1 tablet (25 mg total) by mouth daily., Disp: 90 tablet, Rfl: 1 .  nitroGLYCERIN (NITROSTAT) 0.4 MG SL tablet, Place 1 tablet (0.4 mg total) under the tongue every 5 (five) minutes as needed for chest pain., Disp: 25 tablet, Rfl: 1 .  nystatin-triamcinolone ointment (MYCOLOG), Apply 1 application topically 2 (two) times daily., Disp: 30 g, Rfl: 0 .  Omega-3 Fatty Acids (FISH OIL) 1000 MG CAPS, Take 1,000 mg by mouth daily. , Disp: , Rfl:  .  pantoprazole (PROTONIX) 40 MG tablet, Take 1 tablet (40 mg total) by  mouth daily., Disp: 90 tablet, Rfl: 3 .  rosuvastatin (CRESTOR) 20 MG tablet, Take 1 tablet (20 mg total) by mouth at bedtime., Disp: 90 tablet, Rfl: 0 .  Vitamin D, Ergocalciferol, (DRISDOL) 50000 units CAPS capsule, Take 1 capsule (50,000 Units total) by mouth every 7 (seven) days. (Patient taking differently: Take 50,000 Units by mouth every Monday. ), Disp: 12 capsule, Rfl: 3 Social History   Socioeconomic History  . Marital status: Married    Spouse name: Tye [place]   . Number of children: 2  . Years of education: Not on file  . Highest education level: Not on file  Occupational History  . Occupation: Financial trader  Social Needs  . Financial resource strain: Not on file  . Food insecurity:    Worry: Not on file    Inability: Not on file  . Transportation needs:      Medical: Not on file    Non-medical: Not on file  Tobacco Use  . Smoking status: Never Smoker  . Smokeless tobacco: Current User    Types: Snuff  . Tobacco comment: off an on (pouches) of snuff  Substance and Sexual Activity  . Alcohol use: Yes    Alcohol/week: 3.6 oz    Types: 6 Cans of beer per week    Comment: occasional- depending on the occasion  . Drug use: No  . Sexual activity: Not on file  Lifestyle  . Physical activity:    Days per week: Not on file    Minutes per session: Not on file  . Stress: Not on file  Relationships  . Social connections:    Talks on phone: Not on file    Gets together: Not on file    Attends religious service: Not on file    Active member of club or organization: Not on file    Attends meetings of clubs or organizations: Not on file    Relationship status: Not on file  . Intimate partner violence:    Fear of current or ex partner: Not on file    Emotionally abused: Not on file    Physically abused: Not on file    Forced sexual activity: Not on file  Other Topics Concern  . Not on file  Social History Narrative  . Not on file   Family History  Problem Relation Age of Onset  . Diabetes Mother   . Hyperlipidemia Father   . Heart disease Father   . Hypertension Father   . Diabetes Father   . Diabetes Sister   . Hypertension Sister   . Hyperlipidemia Brother   . Hypertension Brother   . Heart attack Brother 74  . Colon polyps Brother   . Colon cancer Neg Hx   . Stomach cancer Neg Hx     Objective: Office vital signs reviewed. BP 131/76   Pulse (!) 59   Temp (!) 97.1 F (36.2 C) (Oral)   Ht 5\' 8"  (1.727 m)   Wt 242 lb (109.8 kg)   BMI 36.80 kg/m   Physical Examination:  General: Awake, alert, well nourished, No acute distress HEENT: Normal    Eyes: PERRLA, extraocular membranes intact, sclera white Extremities: warm, well perfused, No edema, cyanosis or clubbing; +2 pulses bilaterally  Left hand: Index finger with a  shallow laceration that involves the Palmar aspect of the distal end of the finger.  He is missing the medial tip of his finger pad.  No underlying bone or fat tissue appreciated.  There  is a slow ooze appreciated.  No purulence.  There is tenderness to palpation.  No involvement of the nailbed. Neuro: Neurovascularly intact.  Procedure: Verbal consent was obtained. Left index finger was numbed using a finger block, 3.5 cc of 1% lidocaine without epinephrine used.  Wound was cleaned/irrigated with saline.  The source of bleeding was identified and cordless cautery was used to obtain hemostasis.  Patient tolerated procedure.  No immediate complications.  Assessment/ Plan: 52 y.o. male   1. Laceration of left index finger without foreign body without damage to nail, initial encounter I did highly recommend that they update his tetanus as soon as they go home.  Left index finger was numbed using a finger block.  Wound was cleaned with saline.  The source of bleeding was identified and cordless cautery was used to obtain hemostasis.  Patient tolerated procedure.  No immediate complications.  Wound was again cleaned and dressed in a pressure bandage.  Patient was prescribed doxycycline 100 mg p.o. twice daily for the next 7 days given nature of wound to empirically cover for any infection.  Home care instructions were reviewed and a handout was provided.  Recommended oral NSAIDs as needed pain and inflammation.  Reasons for return, signs and symptoms of infection, discussed.  Patient was good understanding of follow-up as needed.   Janora Norlander, DO [facility] [phone]

## 2018-03-05 NOTE — Patient Instructions (Signed)

## 2018-03-08 ENCOUNTER — Other Ambulatory Visit: Payer: Self-pay | Admitting: *Deleted

## 2018-03-08 DIAGNOSIS — Z23 Encounter for immunization: Secondary | ICD-10-CM | POA: Diagnosis not present

## 2018-03-08 DIAGNOSIS — S61211A Laceration without foreign body of left index finger without damage to nail, initial encounter: Secondary | ICD-10-CM | POA: Diagnosis not present

## 2018-03-08 NOTE — Addendum Note (Signed)
Addended byCarrolyn Leigh on: 03/08/2018 03:42 PM   Modules accepted: Orders

## 2018-03-09 ENCOUNTER — Encounter: Payer: Self-pay | Admitting: Cardiovascular Disease

## 2018-03-09 ENCOUNTER — Ambulatory Visit: Payer: No Typology Code available for payment source | Admitting: Cardiovascular Disease

## 2018-03-09 VITALS — BP 121/83 | HR 73 | Ht 68.0 in | Wt 240.6 lb

## 2018-03-09 DIAGNOSIS — E118 Type 2 diabetes mellitus with unspecified complications: Secondary | ICD-10-CM | POA: Diagnosis not present

## 2018-03-09 DIAGNOSIS — I2510 Atherosclerotic heart disease of native coronary artery without angina pectoris: Secondary | ICD-10-CM

## 2018-03-09 DIAGNOSIS — I10 Essential (primary) hypertension: Secondary | ICD-10-CM | POA: Diagnosis not present

## 2018-03-09 DIAGNOSIS — Z794 Long term (current) use of insulin: Secondary | ICD-10-CM | POA: Diagnosis not present

## 2018-03-09 DIAGNOSIS — E782 Mixed hyperlipidemia: Secondary | ICD-10-CM

## 2018-03-09 DIAGNOSIS — Z79899 Other long term (current) drug therapy: Secondary | ICD-10-CM

## 2018-03-09 DIAGNOSIS — Z955 Presence of coronary angioplasty implant and graft: Secondary | ICD-10-CM | POA: Diagnosis not present

## 2018-03-09 MED ORDER — ICOSAPENT ETHYL 1 G PO CAPS: 2.0000 g | ORAL_CAPSULE | Freq: Two times a day (BID) | ORAL | 3 refills | Status: DC

## 2018-03-09 MED ORDER — ROSUVASTATIN CALCIUM 40 MG PO TABS: 40.0000 mg | ORAL_TABLET | Freq: Every day | ORAL | 3 refills | Status: DC

## 2018-03-09 NOTE — Progress Notes (Addendum)
Cardiology Office Note    Date:  03/11/2018   ID:  [name], DOB [birth year], MRN [id number]  PCP:  [name], MD  Cardiologist:  [name], MD   Chief Complaint  Patient presents with  . Follow-up   Initial evaluation with me  History of Present Illness:  Jimmy Perez. is a 52 y.o. male who presents for initial evaluation with me following his recent catheterization and PCI which was done by Dr. [name].  Mr. Flagg has a history of hypertension, hyperlipidemia, obstructive sleep apnea on CPAP therapy (followed by Dr. Annamaria Boots) and diabetes mellitus.  In December 2016, a treadmill test did not show ischemic changes.  He has strong family history for premature coronary artery disease with a younger brother suffering his first myocardial infarction at age 74 and his father having undergone stenting before age 88.  The patient initially was seen by Mammie Russian with complaints of exertional chest pain in February 2019.  Almyra Deforest, [place] had discussed the case with me since I was DOD that day.  However, since I was not in the Cath Lab in the very near future the patient underwent the catheterization procedure by Dr. [name] on December 14, 2017.  He was found to have 90% stenosis in the obtuse marginal branch of the circumflex vessel which was successfully stented with a synergy 3.0 x 12 mm DES stent.  Subsequently, he has done well.  He denies recurrent symptomatology.  There is no chest pain or shortness of breath.  He had developed a rash and there was concern perhaps that it may have been to Plavix.  However, he was treated with antibiotics by his primary physician which resolved the rash and he is now on Plavix and aspirin.  He continues to use CPAP regularly.  He remains active.  He lives [place] acres caring for chickens and dogs.  Presents for initial follow-up of hospital evaluation with me.  Past Medical History:  Diagnosis Date  . Borderline diabetes    no meds, watching  diet.  . Chronic kidney disease   . Diabetes mellitus without complication ([place])   . GERD (gastroesophageal reflux disease)   . Hyperlipidemia   . Hypertension   . Kidney stones   . OSA (obstructive sleep apnea)    Cpap use--study 12-26-14 Epic suggestive of this.  . Pre-diabetes    "per patient"  . Vitamin D deficiency     Past Surgical History:  Procedure Laterality Date  . CORONARY STENT INTERVENTION N/A 12/14/2017   Procedure: CORONARY STENT INTERVENTION;  Surgeon: [name], Peter M, MD;  Location: [facility];  Service: Cardiovascular;  Laterality: N/A;  . CYSTOSCOPY WITH RETROGRADE PYELOGRAM, URETEROSCOPY AND STENT PLACEMENT Right 05/29/2017   Procedure: CYSTOSCOPY WITH RETROGRADE PYELOGRAM, URETEROSCOPY AND STENT PLACEMENT;  Surgeon: [name], MD;  Location: [facility];  Service: Urology;  Laterality: Right;  . EUS N/A 02/08/2015   Procedure: UPPER ENDOSCOPIC ULTRASOUND (EUS) LINEAR;  Surgeon: [name], MD;  Location: [facility];  Service: Endoscopy;  Laterality: N/A;  . EUS N/A 09/25/2016   Procedure: UPPER ENDOSCOPIC ULTRASOUND (EUS) RADIAL;  Surgeon: [name], MD;  Location: [facility];  Service: Endoscopy;  Laterality: N/A;  . HOLMIUM LASER APPLICATION Right 0/0/8676   Procedure: HOLMIUM LASER APPLICATION;  Surgeon: [name], MD;  Location: [facility];  Service: Urology;  Laterality: Right;  . KNEE ARTHROSCOPY Right 11/28/95   [name])  . LEFT HEART CATH  AND CORONARY ANGIOGRAPHY N/A 12/14/2017   Procedure: LEFT HEART CATH AND CORONARY ANGIOGRAPHY;  Surgeon: [name], Peter M, MD;  Location: [facility];  Service: Cardiovascular;  Laterality: N/A;    Current Medications: Outpatient Medications Prior to Visit  Medication Sig Dispense Refill  . amLODipine (NORVASC) 5 MG tablet Take 1 tablet by mouth  daily (Patient taking differently: Take 5 mg by mouth daily. ) 90 tablet 3  . aspirin EC 81 MG tablet Take 81 mg by mouth daily.    . busPIRone  (BUSPAR) 15 MG tablet Take 1 tablet (15 mg total) by mouth 2 (two) times daily as needed. 60 tablet 1  . clopidogrel (PLAVIX) 75 MG tablet Take 1 tablet (75 mg total) by mouth daily. 90 tablet 3  . colchicine 0.6 MG tablet Take 1 tablet (0.6 mg total) by mouth daily. May repeat in 2 hours x1 in 24hours (Patient taking differently: Take 0.6 mg by mouth daily as needed (for gout). May repeat in 2 hours x1 in 24hours) 20 tablet 1  . cyclobenzaprine (FLEXERIL) 10 MG tablet Take 1 tablet (10 mg total) by mouth 3 (three) times daily as needed for muscle spasms. 30 tablet 1  . doxycycline (VIBRA-TABS) 100 MG tablet Take 1 tablet (100 mg total) by mouth 2 (two) times daily for 7 days. 14 tablet 0  . glucose blood (CONTOUR NEXT TEST) test strip Check BS qd and PRN dx E11.9 100 each 11  . Lancets MISC 1 Stick by Does not apply route daily. 100 each 2  . losartan (COZAAR) 50 MG tablet Take 1 tablet (50 mg total) by mouth daily. 90 tablet 0  . metFORMIN (GLUCOPHAGE) 500 MG tablet Take 1 tablet (500 mg total) by mouth daily with breakfast. 90 tablet 3  . metoprolol succinate (TOPROL-XL) 25 MG 24 hr tablet Take 1 tablet (25 mg total) by mouth daily. 90 tablet 1  . nitroGLYCERIN (NITROSTAT) 0.4 MG SL tablet Place 1 tablet (0.4 mg total) under the tongue every 5 (five) minutes as needed for chest pain. 25 tablet 1  . nystatin-triamcinolone ointment (MYCOLOG) Apply 1 application topically 2 (two) times daily. 30 g 0  . Omega-3 Fatty Acids (FISH OIL) 1000 MG CAPS Take 1,000 mg by mouth daily.     . pantoprazole (PROTONIX) 40 MG tablet Take 1 tablet (40 mg total) by mouth daily. 90 tablet 3  . Vitamin D, Ergocalciferol, (DRISDOL) 50000 units CAPS capsule Take 1 capsule (50,000 Units total) by mouth every 7 (seven) days. (Patient taking differently: Take 50,000 Units by mouth every Monday. ) 12 capsule 3  . rosuvastatin (CRESTOR) 20 MG tablet Take 1 tablet (20 mg total) by mouth at bedtime. 90 tablet 0   No  facility-administered medications prior to visit.      Allergies:   Patient has no known allergies.   Social History   Socioeconomic History  . Marital status: Married    Spouse name: Tye [place]   . Number of children: 2  . Years of education: Not on file  . Highest education level: Not on file  Occupational History  . Occupation: Financial trader  Social Needs  . Financial resource strain: Not on file  . Food insecurity:    Worry: Not on file    Inability: Not on file  . Transportation needs:    Medical: Not on file    Non-medical: Not on file  Tobacco Use  . Smoking status: Never Smoker  . Smokeless tobacco: Current  User    Types: Snuff  . Tobacco comment: off an on (pouches) of snuff  Substance and Sexual Activity  . Alcohol use: Yes    Alcohol/week: 3.6 oz    Types: 6 Cans of beer per week    Comment: occasional- depending on the occasion  . Drug use: No  . Sexual activity: Not on file  Lifestyle  . Physical activity:    Days per week: Not on file    Minutes per session: Not on file  . Stress: Not on file  Relationships  . Social connections:    Talks on phone: Not on file    Gets together: Not on file    Attends religious service: Not on file    Active member of club or organization: Not on file    Attends meetings of clubs or organizations: Not on file    Relationship status: Not on file  Other Topics Concern  . Not on file  Social History Narrative  . Not on file     Family History:  The patient's family history includes Colon polyps in his brother; Diabetes in his father, mother, and sister; Heart attack (age of onset: 32) in his brother; Heart disease in his father; Hyperlipidemia in his brother and father; Hypertension in his brother, father, and sister.   ROS General: Negative; No fevers, chills, or night sweats;  HEENT: Negative; No changes in vision or hearing, sinus congestion, difficulty swallowing Pulmonary: Negative; No cough, wheezing,  shortness of breath, hemoptysis Cardiovascular: See HPI GI: Negative; No nausea, vomiting, diarrhea, or abdominal pain GU: Negative; No dysuria, hematuria, or difficulty voiding Musculoskeletal: Negative; no myalgias, joint pain, or weakness Hematologic/Oncology: Negative; no easy bruising, bleeding Endocrine: Negative; no heat/cold intolerance; no diabetes Neuro: Negative; no changes in balance, headaches Skin: Negative; No rashes or skin lesions Psychiatric: Negative; No behavioral problems, depression Sleep: Negative; No snoring, daytime sleepiness, hypersomnolence, bruxism, restless legs, hypnogognic hallucinations, no cataplexy Other comprehensive 14 point system review is negative.   PHYSICAL EXAM:   VS:  BP 121/83   Pulse 73   Ht _0  (1.727 m)   Wt 240 lb 9.6 oz (109.1 kg)   BMI 36.58 kg/m     Wt Readings from Last 3 Encounters:  03/09/18 240 lb 9.6 oz (109.1 kg)  03/05/18 242 lb (109.8 kg)  01/21/18 242 lb 9.6 oz (110 kg)    General: Alert, oriented, no distress.  Skin: normal turgor, no rashes, warm and dry HEENT: Normocephalic, atraumatic. Pupils equal round and reactive to light; sclera anicteric; extraocular muscles intact; Fundi no AV nicking.  No hemorrhages or exudates.  Discs flat. Nose without nasal septal hypertrophy Mouth/Parynx benign; Mallinpatti scale 3 Neck: No JVD, no carotid bruits; normal carotid upstroke Lungs: clear to ausculatation and percussion; no wheezing or rales Chest wall: without tenderness to palpitation Heart: PMI not displaced, RRR, s1 s2 normal, 1/6 systolic murmur, no diastolic murmur, no rubs, gallops, thrills, or heaves Abdomen: Central adiposity soft, nontender; no hepatosplenomehaly, BS+; abdominal aorta nontender and not dilated by palpation. Back: no CVA tenderness Pulses 2+ Musculoskeletal: full range of motion, normal strength, no joint deformities Extremities: no clubbing cyanosis or edema, Homan's sign negative    Neurologic: grossly nonfocal; Cranial nerves grossly wnl Psychologic: Normal mood and affect   Studies/Labs Reviewed:   EKG:  EKG is  ordered today. ECG (independently read by me): Normal sinus rhythm at 73 bpm.  Nonspecific T wave changes.  Normal intervals.  No ectopy.  Recent Labs: BMP Latest Ref Rng & Units 12/18/2017 12/09/2017 05/23/2017  Glucose 65 - 99 mg/dL 122(H) 127(H) 94  BUN 6 - 24 mg/dL _0 Creatinine 0.76 - 1.27 mg/dL 0.89 0.98 1.11  BUN/Creat Ratio 9 - _1 Sodium 134 - 144 mmol/L 141 137 139  Potassium 3.5 - 5.2 mmol/L 4.9 5.1 4.9  Chloride 96 - 106 mmol/L 102 99 100  CO2 20 - 29 mmol/L 22 30(H) 20  Calcium 8.7 - 10.2 mg/dL 9.7 11.0(H) 9.7     Hepatic Function Latest Ref Rng & Units 02/18/2018 12/18/2017 06/24/2017  Total Protein 6.0 - 8.5 g/dL 7.4 7.0 7.3  Albumin 3.5 - 5.5 g/dL 5.1 4.5 5.1  AST 0 - 40 IU/L _2 ALT 0 - 44 IU/L 33 29 24  Alk Phosphatase 39 - 117 IU/L 72 65 86  Total Bilirubin 0.0 - 1.2 mg/dL 0.6 0.6 0.5  Bilirubin, Direct 0.00 - 0.40 mg/dL 0.17 0.15 0.15    CBC Latest Ref Rng & Units 12/18/2017 12/09/2017 05/28/2017  WBC 3.4 - 10.8 x10E3/uL 6.4 7.2 6.5  Hemoglobin 13.0 - 17.7 g/dL 15.3 15.3 13.5  Hematocrit 37.5 - 51.0 % 44.2 43.7 38.9  Platelets 150 - 379 x10E3/uL 286 336 268   Lab Results  Component Value Date   MCV 94 12/18/2017   MCV 91 12/09/2017   MCV 93 05/28/2017   Lab Results  Component Value Date   TSH 4.300 02/18/2018   Lab Results  Component Value Date   HGBA1C 6.7 (H) 12/18/2017     BNP No results found for: BNP  ProBNP No results found for: PROBNP   Lipid Panel     Component Value Date/Time   CHOL 163 02/18/2018 0000   TRIG 509 (H) 02/18/2018 0000   HDL 38 (L) 02/18/2018 0000   CHOLHDL 4.3 02/18/2018 0000   CHOLHDL 5.4 04/18/2013 0916   VLDL 76 (H) 04/18/2013 0916   LDLCALC Comment 02/18/2018 0000   LDLDIRECT 88 06/06/2015 1527     RADIOLOGY: No results found.   Additional  studies/ records that were reviewed today include:  I reviewed the [facility] from [facility], I reviewed the cardiac catheterization and PCI report of Dr. [name].  Subsequent laboratory by Dr. Laurance Flatten was reviewed.   ASSESSMENT:    1. Status post coronary artery stent placement: LCX OM1 12/14/2017   2. Coronary artery disease involving native coronary artery of native heart without angina pectoris   3. Essential hypertension   4. Mixed hyperlipidemia   5. Medication management   6. Type 2 diabetes mellitus with complication, with long-term current use of insulin (HCC)      PLAN:  Mr. Eugene Isadore is a 52 year old gentleman who has a significant cardiac risk factors notable for hypertension, hyperlipidemia, diabetes mellitus, and has a history of obstructive sleep apnea for at least 3 years.  He has been on CPAP therapy followed by Dr. Annamaria Boots.  He has strong family history for premature CAD.  He had developed chest pain symptomatology suggestive of angina pectoris.  Cardiac catheterization on December 14, 2017 revealed 90% OM1 stenosis which was successfully stented with a synergy DES stent.  He has been asymptomatic with reference to recurrent anginal symptoms.  He has a history of significant mixed hyperlipidemia.  Recent lipid studies has shown triglycerides greater than 500.  He had been taking over-the-counter fish oil.  I am starting him on the Vascepa 2000 mg twice a  day and further titrating Crestor to 40 mg daily.  He may ultimately require fenofibrate but I will not start this presently.  He is moderately obese with a BMI of 36.58.  Weight loss and exercise was strongly recommended.  I stressed on her percent compliance with CPAP therapy with optimal sleep duration at 8 hours.  His blood pressure today is stable on amlodipine 5 mg in addition to losartan 50 mg daily and Toprol-XL 25 mg.  He is diabetic on metformin 500 mg daily.  He is on aspirin and Plavix and is tolerating Plavix without rash.   Repeat laboratory will be checked in 3 months.  I will see him in 4 months for reevaluation.   Medication Adjustments/Labs and Tests Ordered: Current medicines are reviewed at length with the patient today.  Concerns regarding medicines are outlined above.  Medication changes, Labs and Tests ordered today are listed in the Patient Instructions below. Patient Instructions  Medication Instructions:  INCREASE rosuvastatin (Crestor) to 40 mg daily START Vascepa 2g (2 capsules) two times daily STOP OTC fish oil  Labwork: Please return for FASTING labs in 3 months (CMET, Lipid)  Our in office lab hours are Monday-Friday 8:00-4:00, closed for lunch 12:45-1:45 pm.  No appointment needed  Follow-Up: 4 months with Dr. Claiborne Billings  Any Other Special Instructions Will Be Listed Below (If Applicable).     If you need a refill on your cardiac medications before your next appointment, please call your pharmacy.      Signed, [name], MD  03/11/2018 4:44 PM    [facility] [address], [place], [place], [place]  [address] Phone: [phone]

## 2018-03-09 NOTE — Patient Instructions (Signed)
Medication Instructions:  INCREASE rosuvastatin (Crestor) to 40 mg daily START Vascepa 2g (2 capsules) two times daily STOP OTC fish oil  Labwork: Please return for FASTING labs in 3 months (CMET, Lipid)  Our in office lab hours are Monday-Friday 8:00-4:00, closed for lunch 12:45-1:45 pm.  No appointment needed  Follow-Up: 4 months with Dr. Claiborne Billings  Any Other Special Instructions Will Be Listed Below (If Applicable).     If you need a refill on your cardiac medications before your next appointment, please call your pharmacy.

## 2018-03-11 ENCOUNTER — Encounter: Payer: Self-pay | Admitting: Cardiovascular Disease

## 2018-03-11 MED FILL — ROSUVASTATIN CALCIUM 40 MG: 40 | 90 days supply | Qty: 90 | Fill #0

## 2018-03-11 MED FILL — VASCEPA 1 GM CAPSULE: 1 | 90 days supply | Qty: 360 | Fill #0

## 2018-03-12 MED FILL — METOPROLOL SUCCINATE ER 25: 25 | 90 days supply | Qty: 90 | Fill #0

## 2018-03-12 MED FILL — SHIPPING COST: 1 days supply | Qty: 1 | Fill #0

## 2018-03-14 ENCOUNTER — Other Ambulatory Visit: Payer: Self-pay | Admitting: Physician Assistant

## 2018-03-30 ENCOUNTER — Telehealth (HOSPITAL_COMMUNITY): Payer: Self-pay

## 2018-03-30 NOTE — Telephone Encounter (Signed)
Called to follow up with patient in regards to Cardiac Rehab - patient stated he is still traveling back and forth for work and two jobs. Is uncertain how much longer it will be that way. Patient stated he will call if and when he is ready. Closed referral.

## 2018-04-12 MED FILL — SHIPPING COST: 1 days supply | Qty: 1 | Fill #1

## 2018-04-12 MED FILL — PANTOPRAZOLE SOD DR 40 MG T: 40 | 90 days supply | Qty: 90 | Fill #0

## 2018-04-12 MED FILL — VIT D2 1.25 MG (50,000 UNIT: 1.25 MG | 63 days supply | Qty: 9 | Fill #0

## 2018-04-12 MED FILL — AMLODIPINE BESYLATE 5 MG TA: 5 | 90 days supply | Qty: 90 | Fill #0

## 2018-04-12 MED FILL — CLOPIDOGREL 75 MG TABLET: 75 | 90 days supply | Qty: 90 | Fill #0

## 2018-04-21 ENCOUNTER — Ambulatory Visit (INDEPENDENT_AMBULATORY_CARE_PROVIDER_SITE_OTHER): Payer: No Typology Code available for payment source | Admitting: Family Medicine

## 2018-04-21 ENCOUNTER — Encounter: Payer: Self-pay | Admitting: Family Medicine

## 2018-04-21 ENCOUNTER — Ambulatory Visit: Payer: 59 | Admitting: Family Medicine

## 2018-04-21 VITALS — BP 99/57 | HR 55 | Temp 97.3°F | Ht 68.0 in | Wt 235.0 lb

## 2018-04-21 DIAGNOSIS — Z0000 Encounter for general adult medical examination without abnormal findings: Secondary | ICD-10-CM | POA: Diagnosis not present

## 2018-04-21 DIAGNOSIS — E785 Hyperlipidemia, unspecified: Secondary | ICD-10-CM

## 2018-04-21 DIAGNOSIS — E1169 Type 2 diabetes mellitus with other specified complication: Secondary | ICD-10-CM

## 2018-04-21 DIAGNOSIS — K219 Gastro-esophageal reflux disease without esophagitis: Secondary | ICD-10-CM

## 2018-04-21 DIAGNOSIS — N181 Chronic kidney disease, stage 1: Secondary | ICD-10-CM

## 2018-04-21 DIAGNOSIS — I129 Hypertensive chronic kidney disease with stage 1 through stage 4 chronic kidney disease, or unspecified chronic kidney disease: Secondary | ICD-10-CM

## 2018-04-21 DIAGNOSIS — E1122 Type 2 diabetes mellitus with diabetic chronic kidney disease: Secondary | ICD-10-CM

## 2018-04-21 DIAGNOSIS — N400 Enlarged prostate without lower urinary tract symptoms: Secondary | ICD-10-CM

## 2018-04-21 DIAGNOSIS — I10 Essential (primary) hypertension: Secondary | ICD-10-CM

## 2018-04-21 DIAGNOSIS — W57XXXA Bitten or stung by nonvenomous insect and other nonvenomous arthropods, initial encounter: Secondary | ICD-10-CM

## 2018-04-21 DIAGNOSIS — E559 Vitamin D deficiency, unspecified: Secondary | ICD-10-CM

## 2018-04-21 DIAGNOSIS — I2510 Atherosclerotic heart disease of native coronary artery without angina pectoris: Secondary | ICD-10-CM

## 2018-04-21 LAB — URINALYSIS, COMPLETE
BILIRUBIN UA: NEGATIVE
Glucose, UA: NEGATIVE
KETONES UA: NEGATIVE
LEUKOCYTES UA: NEGATIVE
NITRITE UA: NEGATIVE
SPEC GRAV UA: 1.02 (ref 1.005–1.030)
Urobilinogen, Ur: 0.2 mg/dL (ref 0.2–1.0)
pH, UA: 6 (ref 5.0–7.5)

## 2018-04-21 LAB — MICROSCOPIC EXAMINATION
Bacteria, UA: NONE SEEN
EPITHELIAL CELLS (NON RENAL): NONE SEEN /HPF (ref 0–10)
Renal Epithel, UA: NONE SEEN /hpf
WBC, UA: NONE SEEN /hpf (ref 0–5)

## 2018-04-21 MED ORDER — DOXYCYCLINE HYCLATE 100 MG PO TABS: 100.0000 mg | ORAL_TABLET | Freq: Two times a day (BID) | ORAL | 0 refills | Status: DC

## 2018-04-21 MED FILL — DOXYCYCLINE HYCLATE 100 MG: 100 | 21 days supply | Qty: 42 | Fill #0

## 2018-04-21 NOTE — Progress Notes (Signed)
Subjective:    Patient ID: Jimmy Perez., male    DOB: Jan 29, 1966, 52 y.o.   MRN: [id number]  HPI Patient is here today for annual wellness exam and follow up of chronic medical problems which includes hyperlipidemia and diabetes. He is taking medication regularly.  The patient is doing well overall.  He does and has had several tick bites recently.  He does complain of fatigue and loose bowel movements.  The patient brings in blood sugars and blood pressures for review and overall these are good.  I will be scanned into the record.  The patient has a history of morbid obesity and obstructive sleep apnea.  He also has type 2 diabetes.  He has angina pectoris and is being followed by the cardiologist.  He has had a heart cath and this showed single-vessel obstructive coronary artery disease with a normal left ventricular end-diastolic pressure and normal left ventricular function.  He is been on a high intensity statin.  He is also back on his metformin.  The heart findings were made in February of this year.  Family history is positive for diabetes heart disease hyperlipidemia and hypertension.  She is followed by Dr. Claiborne Billings for his heart issues.  Patient has had several different types of ticks removed over the past 4 days one sounds like a [place] tick and the other one may have been a deer tick.  The loose bowel movements have just occurred over the past 24 hours and these were 2 loose bowel movements.  He has not had these before the past 24 hours.  The patient denies any chest pain pressure tightness or shortness of breath.  He has not seen any blood in the stool or had any trouble swallowing or had any black tarry bowel movements.  He has not had a formed bowel movement today.  He is passing his water without problems.  He has an appointment to see the cardiologist again in 4 months which would be about 3 months from now he last saw him 3 weeks ago he was pleased with everything at the time.   The patient has been trying to watch his diet more closely and has lost some weight.  He depends on his wife for keeping up with his medicines and is not a very good historian.  They do bring some blood sugars in for review and many of these are taken 1 hour after eating.  The blood pressure generally are good all of this will be scanned into the record.    Patient Active Problem List   Diagnosis Date Noted  . Type 2 diabetes mellitus without complication, without long-term current use of insulin ([place]) 12/14/2017  . Angina pectoris ([place]) 12/14/2017  . Abnormal CT scan, stomach   . Low back pain 12/06/2014  . Obstructive sleep apnea 11/06/2014  . Metabolic syndrome 83/41/9622  . HTN (hypertension) 04/18/2013  . Hyperlipidemia 04/18/2013  . GERD (gastroesophageal reflux disease) 04/18/2013  . Morbid obesity ([place]) 04/18/2013   Outpatient Encounter Medications as of 04/21/2018  Medication Sig  . amLODipine (NORVASC) 5 MG tablet Take 1 tablet by mouth  daily (Patient taking differently: Take 5 mg by mouth daily. )  . aspirin EC 81 MG tablet Take 81 mg by mouth daily.  . busPIRone (BUSPAR) 15 MG tablet Take 1 tablet (15 mg total) by mouth 2 (two) times daily as needed.  . clopidogrel (PLAVIX) 75 MG tablet Take 1 tablet (75 mg total) by  mouth daily.  . colchicine 0.6 MG tablet Take 1 tablet (0.6 mg total) by mouth daily. May repeat in 2 hours x1 in 24hours (Patient taking differently: Take 0.6 mg by mouth daily as needed (for gout). May repeat in 2 hours x1 in 24hours)  . cyclobenzaprine (FLEXERIL) 10 MG tablet Take 1 tablet (10 mg total) by mouth 3 (three) times daily as needed for muscle spasms.  Marland Kitchen glucose blood (CONTOUR NEXT TEST) test strip Check BS qd and PRN dx E11.9  . Icosapent Ethyl (VASCEPA) 1 g CAPS Take 2 capsules (2 g total) by mouth 2 (two) times daily.  . Lancets MISC 1 Stick by Does not apply route daily.  Marland Kitchen losartan (COZAAR) 50 MG tablet Take 1 tablet (50 mg total) by mouth  daily.  . metFORMIN (GLUCOPHAGE) 500 MG tablet Take 1 tablet (500 mg total) by mouth daily with breakfast.  . metoprolol succinate (TOPROL-XL) 25 MG 24 hr tablet Take 1 tablet (25 mg total) by mouth daily.  Marland Kitchen nystatin-triamcinolone ointment (MYCOLOG) Apply 1 application topically 2 (two) times daily.  . pantoprazole (PROTONIX) 40 MG tablet Take 1 tablet (40 mg total) by mouth daily.  . rosuvastatin (CRESTOR) 40 MG tablet Take 1 tablet (40 mg total) by mouth at bedtime.  . Vitamin D, Ergocalciferol, (DRISDOL) 50000 units CAPS capsule Take 1 capsule (50,000 Units total) by mouth every 7 (seven) days. (Patient taking differently: Take 50,000 Units by mouth every Monday. )  . nitroGLYCERIN (NITROSTAT) 0.4 MG SL tablet Place 1 tablet (0.4 mg total) under the tongue every 5 (five) minutes as needed for chest pain. (Patient not taking: Reported on 04/21/2018)  . [DISCONTINUED] Omega-3 Fatty Acids (FISH OIL) 1000 MG CAPS Take 1,000 mg by mouth daily.    No facility-administered encounter medications on file as of 04/21/2018.       Review of Systems  Constitutional: Positive for fatigue.  HENT: Negative.   Eyes: Negative.   Respiratory: Negative.   Cardiovascular: Negative.   Gastrointestinal: Positive for diarrhea.  Endocrine: Negative.   Genitourinary: Negative.   Musculoskeletal: Negative.   Skin: Negative.        Several tick bites recently  Allergic/Immunologic: Negative.   Neurological: Negative.   Hematological: Negative.   Psychiatric/Behavioral: Negative.        Objective:   Physical Exam  Constitutional: He is oriented to person, place, and time. He appears well-developed and well-nourished. No distress.  The patient is pleasant and alert and depends upon his wife for keeping up with all of his medicines.  HENT:  Head: Normocephalic and atraumatic.  Right Ear: External ear normal.  Left Ear: External ear normal.  Mouth/Throat: Oropharynx is clear and moist. No oropharyngeal  exudate.  Slight nasal congestion left nostril  Eyes: Pupils are equal, round, and reactive to light. Conjunctivae and EOM are normal. Right eye exhibits no discharge. Left eye exhibits no discharge. No scleral icterus.  Neck: Normal range of motion. Neck supple. No thyromegaly present.  No bruits thyromegaly or anterior cervical adenopathy  Cardiovascular: Normal rate, regular rhythm, normal heart sounds and intact distal pulses.  No murmur heard. Heart is regular at 60/min  Pulmonary/Chest: Effort normal and breath sounds normal. He has no wheezes. He has no rales. He exhibits no tenderness.  Clear anteriorly and posteriorly  Abdominal: Soft. Bowel sounds are normal. He exhibits no mass. There is no tenderness. There is no rebound and no guarding.  Abdominal obesity without liver or spleen enlargement masses organ enlargement  or bruits  Genitourinary: Rectum normal and penis normal.  Genitourinary Comments: The prostate is minimally enlarged without lumps or masses.  There were no rectal masses.  External genitalia were within normal limits and no hernias were palpated.  Musculoskeletal: Normal range of motion. He exhibits no edema.  Lymphadenopathy:    He has no cervical adenopathy.  Neurological: He is alert and oriented to person, place, and time. He has normal reflexes. No cranial nerve deficit.  Skin: Skin is warm and dry. No rash noted.  Multiple bites on the abdomen and right lower leg from tick removal rashes noted.  Psychiatric: He has a normal mood and affect. His behavior is normal. Judgment and thought content normal.  Patient is pleasant alert with normal affect mood and behavior.  Nursing note and vitals reviewed.  BP (!) 99/57 (BP Location: Left Arm)   Pulse (!) 55   Temp (!) 97.3 F (36.3 C) (Oral)   Ht '5\' 8"'  (1.727 m)   Wt 235 lb (106.6 kg)   BMI 35.73 kg/m         Assessment & Plan:  1. Annual physical exam -Follow-up with cardiology as planned - CBC with  Differential/Platelet; Future - CMP14+EGFR; Future - Lipid panel; Future - VITAMIN D 25 Hydroxy (Vit-D Deficiency, Fractures); Future - PSA, total and free; Future - Bayer DCA Hb A1c Waived; Future - Urinalysis, Complete - Lyme Ab/Western Blot Reflex; Future - Rocky mtn spotted fvr abs pnl(IgG+IgM); Future  2. Type 2 diabetes mellitus with stage 1 chronic kidney disease and hypertension ([place]) -Continue to monitor blood sugars regularly at home either 2 hours or 4 hours after eating or fasting.  Follow diet closely. - CBC with Differential/Platelet; Future - CMP14+EGFR; Future - Bayer DCA Hb A1c Waived; Future - Bayer DCA Hb A1c Waived; Future  3. Essential hypertension -Blood pressure is good today and he will continue with current treatment - CBC with Differential/Platelet; Future - CMP14+EGFR; Future  4. Vitamin D deficiency -Continue with vitamin D replacement pending results of lab work - CBC with Differential/Platelet; Future - VITAMIN D 25 Hydroxy (Vit-D Deficiency, Fractures); Future  5. Benign prostatic hyperplasia, unspecified whether lower urinary tract symptoms present -Minimal enlargement of the prostate and asymptomatic patient. - CBC with Differential/Platelet; Future - PSA, total and free; Future - Urinalysis, Complete  6. Gastroesophageal reflux disease, esophagitis presence not specified -No complaints with this today. - CBC with Differential/Platelet; Future  7. Hyperlipidemia associated with type 2 diabetes mellitus ([place]) -Continue aggressive treatment in light of coronary artery disease - CBC with Differential/Platelet; Future - Lipid panel; Future  8. Tick bite, initial encounter -He has had apparently several different tick bites one sounds like a [place] tick and the other one sounds like a regular dog tick or deer tick.  This occurred after he was working in the woods helping cut some timbre with a neighbor. - Lyme Ab/Western Blot Reflex;  Future - Rocky mtn spotted fvr abs pnl(IgG+IgM); Future  9. Morbid obesity ([place]) -Continue to work on weight loss through diet and exercise  10. Coronary artery disease involving native coronary artery of native heart without angina pectoris -Follow-up with cardiologist as planned  Meds ordered this encounter  Medications  . doxycycline (VIBRA-TABS) 100 MG tablet    Sig: Take 1 tablet (100 mg total) by mouth 2 (two) times daily. 1 po bid    Dispense:  42 tablet    Refill:  0   Patient Instructions  Continue current  medications. Continue good therapeutic lifestyle changes which include good diet and exercise. Fall precautions discussed with patient. If an FOBT was given today- please return it to our front desk. If you are over 29 years old - you may need Prevnar 19 or the adult Pneumonia vaccine.  **Flu shots are available--- please call and schedule a FLU-CLINIC appointment**  After your visit with [place] today you will receive a survey in the mail or online from [employer] regarding your care with [place]. Please take a moment to fill this out. Your feedback is very important to [place] as you can help [place] better understand your patient needs as well as improve your experience and satisfaction. WE CARE ABOUT YOU!!!   Continue to follow-up with cardiology Over the next 24 to 48 hours drink more fluids and avoid milk cheese ice cream dairy products and have no caffeine We will get the test for [facility] spotted fever and Lyme disease. We will wait at least 24 hours before starting the doxycycline for the tick bites In the future use deep [place] off or DEET free kit repellent Check skin regularly In the future check blood sugars fasting and at least 2 to 4 hours after eating  Arrie Senate MD

## 2018-04-21 NOTE — Patient Instructions (Addendum)
Continue current medications. Continue good therapeutic lifestyle changes which include good diet and exercise. Fall precautions discussed with patient. If an FOBT was given today- please return it to our front desk. If you are over 51 years old - you may need Prevnar 100 or the adult Pneumonia vaccine.  **Flu shots are available--- please call and schedule a FLU-CLINIC appointment**  After your visit with [place] today you will receive a survey in the mail or online from [employer] regarding your care with [place]. Please take a moment to fill this out. Your feedback is very important to [place] as you can help [place] better understand your patient needs as well as improve your experience and satisfaction. WE CARE ABOUT YOU!!!   Continue to follow-up with cardiology Over the next 24 to 48 hours drink more fluids and avoid milk cheese ice cream dairy products and have no caffeine We will get the test for [facility] spotted fever and Lyme disease. We will wait at least 24 hours before starting the doxycycline for the tick bites In the future use deep [place] off or DEET free kit repellent Check skin regularly In the future check blood sugars fasting and at least 2 to 4 hours after eating

## 2018-04-22 LAB — BAYER DCA HB A1C WAIVED: HB A1C: 6.3 % (ref <7.0)

## 2018-04-22 NOTE — Addendum Note (Signed)
Addended by: Earlene Plater on: 04/22/2018 10:02 AM   Modules accepted: Orders

## 2018-04-22 NOTE — Addendum Note (Signed)
Addended by: Earlene Plater on: 04/22/2018 10:00 AM   Modules accepted: Orders

## 2018-04-23 ENCOUNTER — Ambulatory Visit: Payer: No Typology Code available for payment source | Admitting: Family Medicine

## 2018-04-24 LAB — CMP14+EGFR
ALT: 26 IU/L (ref 0–44)
AST: 20 IU/L (ref 0–40)
Albumin/Globulin Ratio: 2.2 (ref 1.2–2.2)
Albumin: 4.4 g/dL (ref 3.5–5.5)
Alkaline Phosphatase: 61 IU/L (ref 39–117)
BILIRUBIN TOTAL: 0.4 mg/dL (ref 0.0–1.2)
BUN/Creatinine Ratio: 17 (ref 9–20)
BUN: 13 mg/dL (ref 6–24)
CHLORIDE: 105 mmol/L (ref 96–106)
CO2: 20 mmol/L (ref 20–29)
Calcium: 9 mg/dL (ref 8.7–10.2)
Creatinine, Ser: 0.75 mg/dL — ABNORMAL LOW (ref 0.76–1.27)
GFR calc non Af Amer: 106 mL/min/{1.73_m2} (ref >59)
GFR, EST AFRICAN AMERICAN: 123 mL/min/{1.73_m2} (ref >59)
Globulin, Total: 2 g/dL (ref 1.5–4.5)
Glucose: 113 mg/dL — ABNORMAL HIGH (ref 65–99)
Potassium: 4.1 mmol/L (ref 3.5–5.2)
Sodium: 141 mmol/L (ref 134–144)
TOTAL PROTEIN: 6.4 g/dL (ref 6.0–8.5)

## 2018-04-24 LAB — CBC WITH DIFFERENTIAL/PLATELET
BASOS ABS: 0 10*3/uL (ref 0.0–0.2)
Basos: 1 %
EOS (ABSOLUTE): 0.4 10*3/uL (ref 0.0–0.4)
Eos: 8 %
Hematocrit: 40.8 % (ref 37.5–51.0)
Hemoglobin: 13.4 g/dL (ref 13.0–17.7)
Immature Grans (Abs): 0 10*3/uL (ref 0.0–0.1)
Immature Granulocytes: 0 %
Lymphocytes Absolute: 1.6 10*3/uL (ref 0.7–3.1)
Lymphs: 32 %
MCH: 31.2 pg (ref 26.6–33.0)
MCHC: 32.8 g/dL (ref 31.5–35.7)
MCV: 95 fL (ref 79–97)
MONOS ABS: 0.4 10*3/uL (ref 0.1–0.9)
Monocytes: 9 %
Neutrophils Absolute: 2.6 10*3/uL (ref 1.4–7.0)
Neutrophils: 50 %
Platelets: 249 10*3/uL (ref 150–450)
RBC: 4.3 x10E6/uL (ref 4.14–5.80)
RDW: 13.6 % (ref 12.3–15.4)
WBC: 5.1 10*3/uL (ref 3.4–10.8)

## 2018-04-24 LAB — LIPID PANEL
CHOL/HDL RATIO: 3.5 ratio (ref 0.0–5.0)
Cholesterol, Total: 119 mg/dL (ref 100–199)
HDL: 34 mg/dL — AB (ref >39)
LDL Calculated: 12 mg/dL (ref 0–99)
Triglycerides: 364 mg/dL — ABNORMAL HIGH (ref 0–149)
VLDL Cholesterol Cal: 73 mg/dL — ABNORMAL HIGH (ref 5–40)

## 2018-04-24 LAB — ROCKY MTN SPOTTED FVR ABS PNL(IGG+IGM)
RMSF IgG: NEGATIVE
RMSF IgM: 0.17 index (ref 0.00–0.89)

## 2018-04-24 LAB — PSA, TOTAL AND FREE
PSA FREE: 0.17 ng/mL
PSA, Free Pct: 56.7 %
Prostate Specific Ag, Serum: 0.3 ng/mL (ref 0.0–4.0)

## 2018-04-24 LAB — LYME AB/WESTERN BLOT REFLEX: LYME DISEASE AB, QUANT, IGM: <0.8 index (ref 0.00–0.79)

## 2018-04-24 LAB — VITAMIN D 25 HYDROXY (VIT D DEFICIENCY, FRACTURES): Vit D, 25-Hydroxy: 36.1 ng/mL (ref 30.0–100.0)

## 2018-04-28 ENCOUNTER — Encounter: Payer: Self-pay | Admitting: Physician Assistant

## 2018-04-28 ENCOUNTER — Ambulatory Visit (INDEPENDENT_AMBULATORY_CARE_PROVIDER_SITE_OTHER): Payer: No Typology Code available for payment source | Admitting: Physician Assistant

## 2018-04-28 VITALS — BP 130/72 | HR 54 | Temp 97.1°F | Ht 68.0 in | Wt 232.2 lb

## 2018-04-28 DIAGNOSIS — N489 Disorder of penis, unspecified: Secondary | ICD-10-CM

## 2018-04-28 DIAGNOSIS — M545 Low back pain, unspecified: Secondary | ICD-10-CM

## 2018-04-28 MED ORDER — CYCLOBENZAPRINE HCL 10 MG PO TABS
10.0000 mg | ORAL_TABLET | Freq: Three times a day (TID) | ORAL | 0 refills | Status: DC | PRN
Start: 2018-04-28 — End: 2018-08-25

## 2018-04-28 MED ORDER — MELOXICAM 15 MG PO TABS: 15.0000 mg | ORAL_TABLET | Freq: Every day | ORAL | 0 refills | Status: DC

## 2018-04-28 NOTE — Patient Instructions (Signed)

## 2018-04-28 NOTE — Progress Notes (Signed)
  Subjective:     Patient ID: Jimmy Loretha Stapler., male   DOB: 1965/11/10, 52 y.o.   MRN: [id number]  HPI Pt with L sided back pain for several days after helping clean a lot Inversion table and topical meds have helped sx Pt also with lesion to the end of the penis No known trauma  Review of Systems  Constitutional: Positive for activity change. Negative for fatigue and fever.  Genitourinary: Negative for decreased urine volume, discharge, dysuria, frequency, genital sores, hematuria, penile pain, testicular pain and urgency.  Musculoskeletal: Positive for back pain. Negative for gait problem.       Denies any radicular sx to the legs or hip areas       Objective:   Physical Exam Gait normal No ecchy/edema to the L-spine + TTP of the L L-spine area with palp spasm FROM of the L-spine but sx increase with flexion and rotation to the L SLR neg Muscle strength distal good DTR 1+/= lower ext + lesion to the L lateral tip of the penis Appear to be blood filled vesicle No surrounding erythema,edema, or induration Does not involve urethral opening    Assessment:     1. Acute left-sided low back pain without sciatica   2. Penile lesion        Plan:     For the lower back area continue with heat/ice Inversion table ok for sx Short course of Mobic and hold any OTC NSAID Bleeding concern reviewed since on Plavix Flexeril 10mg  tid prn SE reviewed Avoid prolonged sitting Lesion may be due to irritation from new type of underwear Continue to observe lesion if it fails to resolve or increases in size refer to Urol

## 2018-05-21 MED FILL — LOSARTAN POTASSIUM 50 MG TA: 50 | 90 days supply | Qty: 90 | Fill #0

## 2018-05-21 MED FILL — metFORMIN HCL 500 MG TABS: 500 | 90 days supply | Qty: 90 | Fill #0

## 2018-06-17 ENCOUNTER — Other Ambulatory Visit: Payer: Self-pay | Admitting: *Deleted

## 2018-06-17 LAB — HM DIABETES EYE EXAM

## 2018-06-17 MED ORDER — METOPROLOL SUCCINATE ER 25 MG PO TB24: 25.0000 mg | ORAL_TABLET | Freq: Every day | ORAL | 3 refills | Status: DC

## 2018-06-17 MED ORDER — VITAMIN D (ERGOCALCIFEROL) 1.25 MG (50000 UNIT) PO CAPS: 50000.0000 [IU] | ORAL_CAPSULE | ORAL | 3 refills | Status: DC

## 2018-06-17 MED FILL — METOPROLOL SUCCINATE ER 25: 25 | 90 days supply | Qty: 90 | Fill #0

## 2018-06-17 MED FILL — ROSUVASTATIN CALCIUM 40 MG: 40 | 90 days supply | Qty: 90 | Fill #1

## 2018-06-17 MED FILL — VIT D2 1.25 MG (50,000 UNIT: 1.25 MG | 84 days supply | Qty: 12 | Fill #0

## 2018-07-08 ENCOUNTER — Other Ambulatory Visit: Payer: Self-pay | Admitting: Family Medicine

## 2018-07-08 MED FILL — VASCEPA 1 GM CAPSULE: 1 | 90 days supply | Qty: 360 | Fill #1

## 2018-07-08 MED FILL — SHIPPING COST: 1 days supply | Qty: 1 | Fill #2

## 2018-07-08 MED FILL — AMLODIPINE BESYLATE 5 MG TA: 5 | 90 days supply | Qty: 90 | Fill #0

## 2018-07-21 ENCOUNTER — Ambulatory Visit: Payer: No Typology Code available for payment source | Admitting: Cardiovascular Disease

## 2018-08-02 MED FILL — CLOPIDOGREL 75 MG TABLET: 75 | 90 days supply | Qty: 90 | Fill #1

## 2018-08-02 MED FILL — PANTOPRAZOLE SOD DR 40 MG T: 40 | 90 days supply | Qty: 90 | Fill #1

## 2018-08-03 MED FILL — SHIPPING COST: 1 days supply | Qty: 1 | Fill #3

## 2018-08-05 ENCOUNTER — Ambulatory Visit: Payer: No Typology Code available for payment source | Admitting: Physician Assistant

## 2018-08-13 ENCOUNTER — Other Ambulatory Visit: Payer: Self-pay | Admitting: Family Medicine

## 2018-08-13 MED ORDER — GLUCOSE BLOOD VI STRP: ORAL_STRIP | 12 refills | Status: DC

## 2018-08-13 MED ORDER — FREESTYLE FREEDOM LITE W/DEVICE KIT: PACK | 0 refills | Status: DC

## 2018-08-13 MED ORDER — FREESTYLE LANCETS MISC: 12 refills | Status: DC

## 2018-08-13 MED FILL — FREESTYLE LITE TEST STRIP: 90 days supply | Qty: 100 | Fill #0

## 2018-08-13 MED FILL — metFORMIN HCL 500 MG TABS: 500 | 90 days supply | Qty: 90 | Fill #0

## 2018-08-13 MED FILL — LOSARTAN POTASSIUM 50 MG TA: 50 | 90 days supply | Qty: 90 | Fill #0

## 2018-08-13 MED FILL — FREESTYLE LITE METER: 20 days supply | Qty: 1 | Fill #0

## 2018-08-13 MED FILL — FREESTYLE LANCETS: 90 days supply | Qty: 100 | Fill #0

## 2018-08-23 ENCOUNTER — Ambulatory Visit: Payer: No Typology Code available for payment source | Admitting: Physician Assistant

## 2018-08-25 ENCOUNTER — Encounter: Payer: Self-pay | Admitting: Family Medicine

## 2018-08-25 ENCOUNTER — Ambulatory Visit (INDEPENDENT_AMBULATORY_CARE_PROVIDER_SITE_OTHER): Payer: No Typology Code available for payment source | Admitting: Family Medicine

## 2018-08-25 ENCOUNTER — Ambulatory Visit (INDEPENDENT_AMBULATORY_CARE_PROVIDER_SITE_OTHER): Payer: No Typology Code available for payment source

## 2018-08-25 VITALS — BP 115/71 | HR 63 | Temp 97.8°F | Ht 68.0 in | Wt 233.0 lb

## 2018-08-25 DIAGNOSIS — E785 Hyperlipidemia, unspecified: Secondary | ICD-10-CM | POA: Diagnosis not present

## 2018-08-25 DIAGNOSIS — E559 Vitamin D deficiency, unspecified: Secondary | ICD-10-CM | POA: Diagnosis not present

## 2018-08-25 DIAGNOSIS — Z23 Encounter for immunization: Secondary | ICD-10-CM

## 2018-08-25 DIAGNOSIS — M7712 Lateral epicondylitis, left elbow: Secondary | ICD-10-CM | POA: Diagnosis not present

## 2018-08-25 DIAGNOSIS — N400 Enlarged prostate without lower urinary tract symptoms: Secondary | ICD-10-CM | POA: Diagnosis not present

## 2018-08-25 DIAGNOSIS — E1169 Type 2 diabetes mellitus with other specified complication: Secondary | ICD-10-CM

## 2018-08-25 DIAGNOSIS — I2510 Atherosclerotic heart disease of native coronary artery without angina pectoris: Secondary | ICD-10-CM

## 2018-08-25 DIAGNOSIS — I10 Essential (primary) hypertension: Secondary | ICD-10-CM | POA: Diagnosis not present

## 2018-08-25 DIAGNOSIS — E1122 Type 2 diabetes mellitus with diabetic chronic kidney disease: Secondary | ICD-10-CM | POA: Diagnosis not present

## 2018-08-25 DIAGNOSIS — I129 Hypertensive chronic kidney disease with stage 1 through stage 4 chronic kidney disease, or unspecified chronic kidney disease: Secondary | ICD-10-CM

## 2018-08-25 DIAGNOSIS — K219 Gastro-esophageal reflux disease without esophagitis: Secondary | ICD-10-CM

## 2018-08-25 DIAGNOSIS — N181 Chronic kidney disease, stage 1: Secondary | ICD-10-CM

## 2018-08-25 MED ORDER — BUSPIRONE HCL 15 MG PO TABS: 15.0000 mg | ORAL_TABLET | Freq: Two times a day (BID) | ORAL | 3 refills | Status: DC | PRN

## 2018-08-25 MED ORDER — TRIAMCINOLONE ACETONIDE 40 MG/ML IJ SUSP
20.0000 mg | Freq: Once | INTRAMUSCULAR | Status: AC
  Administered 2018-08-25: 20 mg via INTRA_ARTICULAR

## 2018-08-25 MED FILL — busPIRone HCL 15 MG TABS: 15 | 90 days supply | Qty: 180 | Fill #0

## 2018-08-25 NOTE — Patient Instructions (Addendum)
Continue current medications. Continue good therapeutic lifestyle changes which include good diet and exercise. Fall precautions discussed with patient. If an FOBT was given today- please return it to our front desk. If you are over 51 years old - you may need Prevnar 73 or the adult Pneumonia vaccine.  **Flu shots are available--- please call and schedule a FLU-CLINIC appointment**  After your visit with [place] today you will receive a survey in the mail or online from [employer] regarding your care with [place]. Please take a moment to fill this out. Your feedback is very important to [place] as you can help [place] better understand your patient needs as well as improve your experience and satisfaction. WE CARE ABOUT YOU!!!   Follow-up with cardiology as planned Discussed with him about an appointment with a nutritionist to help with more weight loss if possible Continue to drink plenty of water Rest the left elbow for a couple weeks and avoid any heavy lifting pushing or pulling. Continue to work aggressively on weight loss with diet and exercise

## 2018-08-25 NOTE — Progress Notes (Signed)
Subjective:    Patient ID: Jimmy Loretha Stapler., male    DOB: [birth year], 52 y.o.   MRN: [id number]  HPI Pt here for follow up and management of chronic medical problems which includes diabetes, hypertension and hyperlipidemia. He is taking medication regularly.  The patient is doing well today other than some left elbow pain.  He does have a history of diabetes obstructive sleep apnea hyperlipidemia angina pectoris type 2 diabetes mellitus hypertension and morbid obesity.  He is on several medicines for his blood sugar his blood pressure and for his heart.  He is requesting a refill on his buspirone.  His last visit with the cardiologist was with Dr. Ellouise Newer and this was in May of this year.  He was following up on the stent placement.  According to that note he was going to see him back again in about 4 months.  Patient does have an upcoming visit with his cardiologist soon.  He has felt a lot better since his stents have been placed and is out to being very active and actually walking a lot and actually doing some running although this is limited due to his knee problems.  He is feeling much better.  He denies any kind of symptoms that he had before the stents were placed because then he felt extremely tired with exertion and he no longer feels that way.  He denies any more shortness of breath than usual he denies any trouble with his stomach but did have a bout about 3 weeks ago with some severe heartburn and has not had any problems since.  He associated this with some salt so that was very spicy.  He is passing his water without problems.  He does complain of some left elbow pain or tendinitis.    Patient Active Problem List   Diagnosis Date Noted  . Type 2 diabetes mellitus without complication, without long-term current use of insulin ([place]) 12/14/2017  . Angina pectoris ([place]) 12/14/2017  . Abnormal CT scan, stomach   . Low back pain 12/06/2014  . Obstructive sleep apnea 11/06/2014  .  Metabolic syndrome 36/64/4034  . HTN (hypertension) 04/18/2013  . Hyperlipidemia 04/18/2013  . GERD (gastroesophageal reflux disease) 04/18/2013  . Morbid obesity ([place]) 04/18/2013   Outpatient Encounter Medications as of 08/25/2018  Medication Sig  . amLODipine (NORVASC) 5 MG tablet TAKE 1 TABLET BY MOUTH ONCE DAILY  . aspirin EC 81 MG tablet Take 81 mg by mouth daily.  . Blood Glucose Monitoring Suppl (FREESTYLE FREEDOM LITE) w/Device KIT Check BS once daily  . busPIRone (BUSPAR) 15 MG tablet Take 1 tablet (15 mg total) by mouth 2 (two) times daily as needed.  . clopidogrel (PLAVIX) 75 MG tablet Take 1 tablet (75 mg total) by mouth daily.  . colchicine 0.6 MG tablet Take 1 tablet (0.6 mg total) by mouth daily. May repeat in 2 hours x1 in 24hours (Patient taking differently: Take 0.6 mg by mouth daily as needed (for gout). May repeat in 2 hours x1 in 24hours)  . cyclobenzaprine (FLEXERIL) 10 MG tablet Take 1 tablet (10 mg total) by mouth 3 (three) times daily as needed for muscle spasms.  Marland Kitchen glucose blood (FREESTYLE TEST STRIPS) test strip E11.9  Test once daily  . Icosapent Ethyl (VASCEPA) 1 g CAPS Take 2 capsules (2 g total) by mouth 2 (two) times daily.  . Lancets (FREESTYLE) lancets Stick once daily for glucose  . losartan (COZAAR) 50 MG tablet TAKE 1  TABLET BY MOUTH ONCE DAILY  . metFORMIN (GLUCOPHAGE) 500 MG tablet TAKE 1 TABLET BY MOUTH ONCE DAILY WITH BREAKFAST  . metoprolol succinate (TOPROL-XL) 25 MG 24 hr tablet Take 1 tablet (25 mg total) by mouth daily.  . pantoprazole (PROTONIX) 40 MG tablet Take 1 tablet (40 mg total) by mouth daily.  . rosuvastatin (CRESTOR) 40 MG tablet Take 1 tablet (40 mg total) by mouth at bedtime.  . Vitamin D, Ergocalciferol, (DRISDOL) 50000 units CAPS capsule Take 1 capsule (50,000 Units total) by mouth every 7 (seven) days.  . nitroGLYCERIN (NITROSTAT) 0.4 MG SL tablet Place 1 tablet (0.4 mg total) under the tongue every 5 (five) minutes as needed for  chest pain. (Patient not taking: Reported on 04/21/2018)  . [DISCONTINUED] cyclobenzaprine (FLEXERIL) 10 MG tablet Take 1 tablet (10 mg total) by mouth 3 (three) times daily as needed for muscle spasms.  . [DISCONTINUED] doxycycline (VIBRA-TABS) 100 MG tablet Take 1 tablet (100 mg total) by mouth 2 (two) times daily. 1 po bid  . [DISCONTINUED] meloxicam (MOBIC) 15 MG tablet Take 1 tablet (15 mg total) by mouth daily.  . [DISCONTINUED] nystatin-triamcinolone ointment (MYCOLOG) Apply 1 application topically 2 (two) times daily.   No facility-administered encounter medications on file as of 08/25/2018.      Review of Systems  Constitutional: Negative.   HENT: Negative.   Eyes: Negative.   Respiratory: Negative.   Cardiovascular: Negative.   Gastrointestinal: Negative.   Endocrine: Negative.   Genitourinary: Negative.   Musculoskeletal: Positive for arthralgias (left elbow pain ).  Skin: Negative.   Allergic/Immunologic: Negative.   Neurological: Negative.   Hematological: Negative.   Psychiatric/Behavioral: Negative.        Some stress        Objective:   Physical Exam  Constitutional: He is oriented to person, place, and time. He appears well-developed and well-nourished. No distress.  The patient is pleasant and doing well and just wishes that he could do better with losing weight and says that he is really following his diet very closely.  He feels like the biggest issue is not getting enough of the right exercise.  HENT:  Head: Normocephalic and atraumatic.  Right Ear: External ear normal.  Left Ear: External ear normal.  Mouth/Throat: Oropharynx is clear and moist. No oropharyngeal exudate.  Nasal turbinate congestion bilaterally  Eyes: Pupils are equal, round, and reactive to light. Conjunctivae and EOM are normal. Right eye exhibits no discharge. Left eye exhibits no discharge. No scleral icterus.  Eye exam in the past 3 months and everything was good according to the  patient  Neck: Normal range of motion. Neck supple. No thyromegaly present.  No bruits thyromegaly or anterior cervical adenopathy  Cardiovascular: Normal rate, regular rhythm, normal heart sounds and intact distal pulses.  No murmur heard. Heart is regular at 60/min with no edema and good pedal pulses  Pulmonary/Chest: Effort normal and breath sounds normal. He has no wheezes. He has no rales. He exhibits no tenderness.  Clear anteriorly and posteriorly and no axillary adenopathy chest wall tenderness or masses.  Abdominal: Soft. Bowel sounds are normal. He exhibits no mass. There is no tenderness.  Abdominal obesity without masses tenderness organ enlargement bruits.  Musculoskeletal: Normal range of motion. He exhibits tenderness. He exhibits no edema or deformity.  Tender left lateral epicondyle  Lymphadenopathy:    He has no cervical adenopathy.  Neurological: He is alert and oriented to person, place, and time. He has normal reflexes.  No cranial nerve deficit.  Skin: Skin is warm and dry. No rash noted.  Psychiatric: He has a normal mood and affect. His behavior is normal. Judgment and thought content normal.  The patient's mood affect and behavior were all normal.  Nursing note and vitals reviewed.  BP 115/71 (BP Location: Left Arm)   Pulse 63   Temp 97.8 F (36.6 C) (Oral)   Ht _0  (1.727 m)   Wt 233 lb (105.7 kg)   BMI 35.43 kg/m   1/2 cc of Marcaine and Kenalog injected into the tender point area of left elbow and to left lateral epicondyle.  This is done under sterile technique and patient tolerated procedure well.     Assessment & Plan:  1. 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 Essential hypertension -Blood pressure is good today and he  will continue with current treatment - DG Chest 2 View; Future - BMP8+EGFR - CBC with Differential/Platelet - Hepatic function panel  3. Vitamin D deficiency -Continue with vitamin D replacement pending results of lab work - CBC with Differential/Platelet - VITAMIN D 25 Hydroxy (Vit-D Deficiency, Fractures)  4. Benign prostatic hyperplasia, unspecified whether lower urinary tract symptoms present -No complaints today with voiding. - CBC with Differential/Platelet  5. Hyperlipidemia associated with type 2 diabetes mellitus ([place]) -Continue with current treatment which includes Vascepa along with good blood sugar control. - DG Chest 2 View; Future - CBC with Differential/Platelet - Lipid panel  6. Gastroesophageal reflux disease, esophagitis presence not specified -Use Pepcid AC as needed twice daily before breakfast and supper - CBC with Differential/Platelet - Hepatic function panel  7. Need for immunization against influenza - Flu Vaccine QUAD 36+ mos IM  8. Left lateral epicondylitis -1/2 cc of Marcaine and 1/2 cc of Kenalog injected to tender point of left elbow with sterile technique.  9. Morbid obesity ([place]) -Continue with aggressive therapeutic lifestyle changes and discuss dietary intervention with cardiology to see if he has any suggestions for helping with continued weight loss through nutrition [place]  10. Coronary artery disease involving native coronary artery of native heart without angina pectoris -Follow-up with cardiology as planned  Meds ordered this encounter  Medications  . busPIRone (BUSPAR) 15 MG tablet    Sig: Take 1 tablet (15 mg total) by mouth 2 (two) times daily as needed.    Dispense:  180 tablet    Refill:  3   Patient Instructions  Continue current medications. Continue good therapeutic lifestyle changes which include good diet and exercise. Fall precautions discussed with patient. If an FOBT was given today- please return it to our front  desk. If you are over 21 years old - you may need Prevnar 22 or the adult Pneumonia vaccine.  **Flu shots are available--- please call and schedule a FLU-CLINIC appointment**  After your visit with [place] today you will receive a survey in the mail or online from [employer] regarding your care with [place]. Please take a moment to fill this out. Your feedback is very important to [place] as you can help [place] better understand your patient needs as well as improve your experience and satisfaction. WE CARE ABOUT YOU!!!   Follow-up with cardiology as planned Discussed with him about an appointment with a nutritionist to help with more  weight loss if possible Continue to drink plenty of water Rest the left elbow for a couple weeks and avoid any heavy lifting pushing or pulling. Continue to work aggressively on weight loss with diet and exercise  Arrie Senate MD

## 2018-08-25 NOTE — Addendum Note (Signed)
Addended by: Zannie Cove on: 08/25/2018 05:04 PM   Modules accepted: Orders

## 2018-08-27 ENCOUNTER — Other Ambulatory Visit: Payer: Self-pay | Admitting: *Deleted

## 2018-08-27 LAB — BAYER DCA HB A1C WAIVED: HB A1C (BAYER DCA - WAIVED): 6.5 % (ref <7.0)

## 2018-08-27 MED ORDER — LOSARTAN POTASSIUM 50 MG PO TABS: 50.0000 mg | ORAL_TABLET | Freq: Every day | ORAL | 3 refills | Status: DC

## 2018-08-27 MED ORDER — AMLODIPINE BESYLATE 5 MG PO TABS: ORAL_TABLET | ORAL | 3 refills | Status: DC

## 2018-08-27 MED ORDER — CLOPIDOGREL BISULFATE 75 MG PO TABS: 75.0000 mg | ORAL_TABLET | Freq: Every day | ORAL | 3 refills | Status: DC

## 2018-08-27 MED ORDER — METFORMIN HCL 500 MG PO TABS: ORAL_TABLET | ORAL | 3 refills | Status: DC

## 2018-08-27 MED ORDER — PANTOPRAZOLE SODIUM 40 MG PO TBEC: 40.0000 mg | DELAYED_RELEASE_TABLET | Freq: Every day | ORAL | 3 refills | Status: DC

## 2018-08-27 MED FILL — VIT D2 1.25 MG (50,000 UNIT: 1.25 MG | 84 days supply | Qty: 12 | Fill #1

## 2018-08-28 LAB — CBC WITH DIFFERENTIAL/PLATELET
Basophils Absolute: 0 10*3/uL (ref 0.0–0.2)
Basos: 1 %
EOS (ABSOLUTE): 0.2 10*3/uL (ref 0.0–0.4)
EOS: 3 %
HEMATOCRIT: 40.1 % (ref 37.5–51.0)
HEMOGLOBIN: 13.9 g/dL (ref 13.0–17.7)
IMMATURE GRANULOCYTES: 0 %
Immature Grans (Abs): 0 10*3/uL (ref 0.0–0.1)
LYMPHS: 35 %
Lymphocytes Absolute: 1.9 10*3/uL (ref 0.7–3.1)
MCH: 31.8 pg (ref 26.6–33.0)
MCHC: 34.7 g/dL (ref 31.5–35.7)
MCV: 92 fL (ref 79–97)
MONOCYTES: 9 %
Monocytes Absolute: 0.5 10*3/uL (ref 0.1–0.9)
NEUTROS PCT: 52 %
Neutrophils Absolute: 2.7 10*3/uL (ref 1.4–7.0)
Platelets: 264 10*3/uL (ref 150–450)
RBC: 4.37 x10E6/uL (ref 4.14–5.80)
RDW: 13.1 % (ref 12.3–15.4)
WBC: 5.3 10*3/uL (ref 3.4–10.8)

## 2018-08-28 LAB — BMP8+EGFR
BUN/Creatinine Ratio: 16 (ref 9–20)
BUN: 12 mg/dL (ref 6–24)
CO2: 20 mmol/L (ref 20–29)
CREATININE: 0.75 mg/dL — AB (ref 0.76–1.27)
Calcium: 9.4 mg/dL (ref 8.7–10.2)
Chloride: 102 mmol/L (ref 96–106)
GFR calc Af Amer: 123 mL/min/{1.73_m2} (ref >59)
GFR calc non Af Amer: 106 mL/min/{1.73_m2} (ref >59)
GLUCOSE: 102 mg/dL — AB (ref 65–99)
Potassium: 4.5 mmol/L (ref 3.5–5.2)
Sodium: 141 mmol/L (ref 134–144)

## 2018-08-28 LAB — LIPID PANEL
CHOL/HDL RATIO: 3.5 ratio (ref 0.0–5.0)
CHOLESTEROL TOTAL: 121 mg/dL (ref 100–199)
HDL: 35 mg/dL — AB (ref >39)
LDL CALC: 22 mg/dL (ref 0–99)
TRIGLYCERIDES: 320 mg/dL — AB (ref 0–149)
VLDL Cholesterol Cal: 64 mg/dL — ABNORMAL HIGH (ref 5–40)

## 2018-08-28 LAB — HEPATIC FUNCTION PANEL
ALK PHOS: 68 IU/L (ref 39–117)
ALT: 27 IU/L (ref 0–44)
AST: 16 IU/L (ref 0–40)
Albumin: 4.9 g/dL (ref 3.5–5.5)
BILIRUBIN TOTAL: 0.6 mg/dL (ref 0.0–1.2)
Bilirubin, Direct: 0.18 mg/dL (ref 0.00–0.40)
Total Protein: 6.7 g/dL (ref 6.0–8.5)

## 2018-08-28 LAB — VITAMIN D 25 HYDROXY (VIT D DEFICIENCY, FRACTURES): Vit D, 25-Hydroxy: 33.7 ng/mL (ref 30.0–100.0)

## 2018-09-13 ENCOUNTER — Encounter: Payer: Self-pay | Admitting: *Deleted

## 2018-09-13 NOTE — Progress Notes (Signed)
Per Dr Laurance Flatten - pt need a referral for supplies for his CPAP

## 2018-09-21 ENCOUNTER — Encounter: Payer: Self-pay | Admitting: Physician Assistant

## 2018-09-21 ENCOUNTER — Ambulatory Visit (INDEPENDENT_AMBULATORY_CARE_PROVIDER_SITE_OTHER): Payer: No Typology Code available for payment source | Admitting: Physician Assistant

## 2018-09-21 ENCOUNTER — Ambulatory Visit (INDEPENDENT_AMBULATORY_CARE_PROVIDER_SITE_OTHER): Payer: No Typology Code available for payment source

## 2018-09-21 VITALS — BP 137/75 | HR 82 | Temp 98.4°F | Ht 68.0 in | Wt 233.0 lb

## 2018-09-21 DIAGNOSIS — M25511 Pain in right shoulder: Secondary | ICD-10-CM | POA: Diagnosis not present

## 2018-09-21 DIAGNOSIS — S46001A Unspecified injury of muscle(s) and tendon(s) of the rotator cuff of right shoulder, initial encounter: Secondary | ICD-10-CM | POA: Diagnosis not present

## 2018-09-21 MED ORDER — METHYLPREDNISOLONE ACETATE 80 MG/ML IJ SUSP
80.0000 mg | Freq: Once | INTRAMUSCULAR | Status: AC
  Administered 2018-09-21: 80 mg via INTRAMUSCULAR

## 2018-09-22 NOTE — Progress Notes (Signed)
BP 137/75   Pulse 82   Temp 98.4 F (36.9 C) (Oral)   Ht '5\' 8"'  (1.727 m)   Wt 233 lb (105.7 kg)   BMI 35.43 kg/m     Subjective:    Patient ID: Jimmy Loretha Stapler., male    DOB: [birth year], 52 y.o.   MRN: [id number]  HPI: Jimmy Jule Schlabach. is a 52 y.o. male presenting on 09/21/2018 for Shoulder Pain (fell on Saturday night )  3 days ago the patient slipped outside on the step and fell into landscape timber's hitting his right shoulder on the front part.  He had excruciating amount of pain.  Ever since then he has had limited to no range of motion.  He has to use his other hand to lift the arm.  The pain is quite severe.  He was concerned about the shoulder being broken.  The x-ray did not show any bony abnormality.  Therefore I think we should move onto an MRI to evaluate the tissue abnormalities.  Past Medical History:  Diagnosis Date  . Borderline diabetes    no meds, watching diet.  . Chronic kidney disease   . Diabetes mellitus without complication ([place])   . GERD (gastroesophageal reflux disease)   . Hyperlipidemia   . Hypertension   . Kidney stones   . OSA (obstructive sleep apnea)    Cpap use--study 12-26-14 Epic suggestive of this.  . Pre-diabetes    "per patient"  . Vitamin D deficiency    Relevant past medical, surgical, family and social history reviewed and updated as indicated. Interim medical history since our last visit reviewed. Allergies and medications reviewed and updated. DATA REVIEWED: CHART IN EPIC  Family History reviewed for pertinent findings.  Review of Systems  Constitutional: Negative.  Negative for appetite change and fatigue.  Eyes: Negative for pain and visual disturbance.  Respiratory: Negative.  Negative for cough, chest tightness, shortness of breath and wheezing.   Cardiovascular: Negative.  Negative for chest pain, palpitations and leg swelling.  Gastrointestinal: Negative.  Negative for abdominal pain, diarrhea, nausea and  vomiting.  Genitourinary: Negative.   Musculoskeletal: Positive for arthralgias, back pain, joint swelling, myalgias, neck pain and neck stiffness.  Skin: Negative.  Negative for color change and rash.  Neurological: Negative.  Negative for weakness, numbness and headaches.  Psychiatric/Behavioral: Negative.     Allergies as of 09/21/2018   No Known Allergies     Medication List        Accurate as of 09/21/18 11:59 PM. Always use your most recent med list.          amLODipine 5 MG tablet Commonly known as:  NORVASC TAKE 1 TABLET BY MOUTH ONCE DAILY   aspirin EC 81 MG tablet Take 81 mg by mouth daily.   busPIRone 15 MG tablet Commonly known as:  BUSPAR Take 1 tablet (15 mg total) by mouth 2 (two) times daily as needed.   clopidogrel 75 MG tablet Commonly known as:  PLAVIX Take 1 tablet (75 mg total) by mouth daily.   colchicine 0.6 MG tablet Take 1 tablet (0.6 mg total) by mouth daily. May repeat in 2 hours x1 in 24hours   cyclobenzaprine 10 MG tablet Commonly known as:  FLEXERIL Take 1 tablet (10 mg total) by mouth 3 (three) times daily as needed for muscle spasms.   FREESTYLE FREEDOM LITE w/Device Kit Check BS once daily   freestyle lancets Stick once daily for glucose   glucose  blood test strip E11.9  Test once daily   Icosapent Ethyl 1 g Caps Take 2 capsules (2 g total) by mouth 2 (two) times daily.   losartan 50 MG tablet Commonly known as:  COZAAR Take 1 tablet (50 mg total) by mouth daily.   metFORMIN 500 MG tablet Commonly known as:  GLUCOPHAGE TAKE 1 TABLET BY MOUTH ONCE DAILY WITH BREAKFAST   metoprolol succinate 25 MG 24 hr tablet Commonly known as:  TOPROL-XL Take 1 tablet (25 mg total) by mouth daily.   nitroGLYCERIN 0.4 MG SL tablet Commonly known as:  NITROSTAT Place 1 tablet (0.4 mg total) under the tongue every 5 (five) minutes as needed for chest pain.   pantoprazole 40 MG tablet Commonly known as:  PROTONIX Take 1 tablet (40 mg  total) by mouth daily.   rosuvastatin 40 MG tablet Commonly known as:  CRESTOR Take 1 tablet (40 mg total) by mouth at bedtime.   Vitamin D (Ergocalciferol) 1.25 MG (50000 UT) Caps capsule Commonly known as:  DRISDOL Take 1 capsule (50,000 Units total) by mouth every 7 (seven) days.          Objective:    BP 137/75   Pulse 82   Temp 98.4 F (36.9 C) (Oral)   Ht '5\' 8"'  (1.727 m)   Wt 233 lb (105.7 kg)   BMI 35.43 kg/m    No Known Allergies  Wt Readings from Last 3 Encounters:  08/25/18 233 lb (105.7 kg)  04/28/18 232 lb 4 oz (105.3 kg)  04/21/18 235 lb (106.6 kg)    Physical Exam  Constitutional: He appears well-developed and well-nourished. No distress.  HENT:  Head: Normocephalic and atraumatic.  Eyes: Pupils are equal, round, and reactive to light. Conjunctivae and EOM are normal.  Cardiovascular: Normal rate, regular rhythm and normal heart sounds.  Pulmonary/Chest: Effort normal and breath sounds normal. No respiratory distress.  Musculoskeletal:       Right shoulder: He exhibits decreased range of motion, tenderness, swelling, pain, spasm and decreased strength. He exhibits no crepitus and no deformity.       Arms: Patient is unable to lift arm more than 30 degrees and has to use his other arm to help do that.  There is severe limit and range of motion in the anterior and lateral aspects  Skin: Skin is warm and dry.  Psychiatric: He has a normal mood and affect. His behavior is normal.  Nursing note and vitals reviewed.   Results for orders placed or performed in visit on 08/25/18  [facility]  Result Value Ref Range   Glucose 102 (H) 65 - 99 mg/dL   BUN 12 6 - 24 mg/dL   Creatinine, Ser 0.75 (L) 0.76 - 1.27 mg/dL   GFR calc non Af Amer 106 >59 mL/min/1.73   GFR calc Af Amer 123 >59 mL/min/1.73   BUN/Creatinine Ratio 16 9 - 20   Sodium 141 134 - 144 mmol/L   Potassium 4.5 3.5 - 5.2 mmol/L   Chloride 102 96 - 106 mmol/L   CO2 20 20 - 29 mmol/L   Calcium  9.4 8.7 - 10.2 mg/dL  CBC with Differential/Platelet  Result Value Ref Range   WBC 5.3 3.4 - 10.8 x10E3/uL   RBC 4.37 4.14 - 5.80 x10E6/uL   Hemoglobin 13.9 13.0 - 17.7 g/dL   Hematocrit 40.1 37.5 - 51.0 %   MCV 92 79 - 97 fL   MCH 31.8 26.6 - 33.0 pg   MCHC 34.7  31.5 - 35.7 g/dL   RDW 13.1 12.3 - 15.4 %   Platelets 264 150 - 450 x10E3/uL   Neutrophils 52 Not Estab. %   Lymphs 35 Not Estab. %   Monocytes 9 Not Estab. %   Eos 3 Not Estab. %   Basos 1 Not Estab. %   Neutrophils Absolute 2.7 1.4 - 7.0 x10E3/uL   Lymphocytes Absolute 1.9 0.7 - 3.1 x10E3/uL   Monocytes Absolute 0.5 0.1 - 0.9 x10E3/uL   EOS (ABSOLUTE) 0.2 0.0 - 0.4 x10E3/uL   Basophils Absolute 0.0 0.0 - 0.2 x10E3/uL   Immature Granulocytes 0 Not Estab. %   Immature Grans (Abs) 0.0 0.0 - 0.1 x10E3/uL  Lipid panel  Result Value Ref Range   Cholesterol, Total 121 100 - 199 mg/dL   Triglycerides 320 (H) 0 - 149 mg/dL   HDL 35 (L) >39 mg/dL   VLDL Cholesterol Cal 64 (H) 5 - 40 mg/dL   LDL Calculated 22 0 - 99 mg/dL   Chol/HDL Ratio 3.5 0.0 - 5.0 ratio  VITAMIN D 25 Hydroxy (Vit-D Deficiency, Fractures)  Result Value Ref Range   Vit D, 25-Hydroxy 33.7 30.0 - 100.0 ng/mL  Hepatic function panel  Result Value Ref Range   Total Protein 6.7 6.0 - 8.5 g/dL   Albumin 4.9 3.5 - 5.5 g/dL   Bilirubin Total 0.6 0.0 - 1.2 mg/dL   Bilirubin, Direct 0.18 0.00 - 0.40 mg/dL   Alkaline Phosphatase 68 39 - 117 IU/L   AST 16 0 - 40 IU/L   ALT 27 0 - 44 IU/L  Bayer DCA Hb A1c Waived  Result Value Ref Range   HB A1C (BAYER DCA - WAIVED) 6.5 <7.0 %      Assessment & Plan:   1. Acute pain of right shoulder - DG Shoulder Right; Future - methylPREDNISolone acetate (DEPO-MEDROL) injection 80 mg - MR Shoulder Right Wo Contrast; Future  2. Injury of right rotator cuff, initial encounter - methylPREDNISolone acetate (DEPO-MEDROL) injection 80 mg   Continue all other maintenance medications as listed above.  Follow up plan: No  follow-ups on file.  Educational handout given for [place] PA-C [place] [address]  [place], [place] [address] [phone]   09/22/2018, 8:58 AM

## 2018-09-24 MED FILL — ROSUVASTATIN CALCIUM 40 MG: 40 | 90 days supply | Qty: 90 | Fill #2

## 2018-09-24 MED FILL — METOPROLOL SUCCINATE ER 25: 25 | 90 days supply | Qty: 90 | Fill #1

## 2018-10-03 ENCOUNTER — Ambulatory Visit
Admission: RE | Admit: 2018-10-03 | Discharge: 2018-10-03 | Disposition: A | Discharge status: Home / Self Care | Payer: No Typology Code available for payment source | Source: Ambulatory Visit | Attending: Physician Assistant | Admitting: Physician Assistant

## 2018-10-03 DIAGNOSIS — M25511 Pain in right shoulder: Secondary | ICD-10-CM

## 2018-10-04 ENCOUNTER — Other Ambulatory Visit: Payer: Self-pay | Admitting: Physician Assistant

## 2018-10-04 DIAGNOSIS — M12811 Other specific arthropathies, not elsewhere classified, right shoulder: Secondary | ICD-10-CM

## 2018-10-04 DIAGNOSIS — M75101 Unspecified rotator cuff tear or rupture of right shoulder, not specified as traumatic: Secondary | ICD-10-CM

## 2018-10-04 NOTE — Progress Notes (Unsigned)
o

## 2018-10-13 ENCOUNTER — Ambulatory Visit: Payer: No Typology Code available for payment source | Attending: Orthopedic Surgery | Admitting: Physical Therapy

## 2018-10-13 ENCOUNTER — Encounter: Payer: Self-pay | Admitting: Physical Therapy

## 2018-10-13 ENCOUNTER — Other Ambulatory Visit: Payer: Self-pay

## 2018-10-13 DIAGNOSIS — R29898 Other symptoms and signs involving the musculoskeletal system: Secondary | ICD-10-CM | POA: Diagnosis present

## 2018-10-13 DIAGNOSIS — M6281 Muscle weakness (generalized): Secondary | ICD-10-CM | POA: Insufficient documentation

## 2018-10-13 DIAGNOSIS — M25511 Pain in right shoulder: Secondary | ICD-10-CM | POA: Diagnosis not present

## 2018-10-13 DIAGNOSIS — M25611 Stiffness of right shoulder, not elsewhere classified: Secondary | ICD-10-CM | POA: Diagnosis present

## 2018-10-13 NOTE — Therapy (Signed)
[facility] [address]  [place], [place], [address] Phone: [phone]   Fax:  [phone]  Physical Therapy Evaluation  Patient Details  Name: Jimmy Perez. MRN: [id number] Date of Birth: Dec 04, 1965 Referring Provider (PT): Justice Britain, MD   Encounter Date: 10/13/2018  PT End of Session - 10/13/18 1402    Visit Number  1    Number of Visits  6    Date for PT Re-Evaluation  11/17/18    Authorization Type  [facility]    PT Start Time  1311    PT Stop Time  1357    PT Time Calculation (min)  46 min    Activity Tolerance  Patient tolerated treatment well;Patient limited by pain    Behavior During Therapy  [facility] for tasks assessed/performed       Past Medical History:  Diagnosis Date  . Borderline diabetes    no meds, watching diet.  . Chronic kidney disease   . Diabetes mellitus without complication ([place])   . GERD (gastroesophageal reflux disease)   . Hyperlipidemia   . Hypertension   . Kidney stones   . OSA (obstructive sleep apnea)    Cpap use--study 12-26-14 Epic suggestive of this.  . Pre-diabetes    "per patient"  . Vitamin D deficiency     Past Surgical History:  Procedure Laterality Date  . CORONARY STENT INTERVENTION N/A 12/14/2017   Procedure: CORONARY STENT INTERVENTION;  Surgeon: [name], Peter M, MD;  Location: [facility];  Service: Cardiovascular;  Laterality: N/A;  . CYSTOSCOPY WITH RETROGRADE PYELOGRAM, URETEROSCOPY AND STENT PLACEMENT Right 05/29/2017   Procedure: CYSTOSCOPY WITH RETROGRADE PYELOGRAM, URETEROSCOPY AND STENT PLACEMENT;  Surgeon: [name], MD;  [facility];  Service: Urology;  Laterality: Right;  . EUS N/A 02/08/2015   Procedure: UPPER ENDOSCOPIC ULTRASOUND (EUS) LINEAR;  Surgeon: [name], MD;  [facility];  Service: Endoscopy;  Laterality: N/A;  . EUS N/A 09/25/2016   Procedure: UPPER ENDOSCOPIC ULTRASOUND (EUS) RADIAL;  Surgeon:  [name], MD;  [facility];  Service: Endoscopy;  Laterality: N/A;  . HOLMIUM LASER APPLICATION Right 07/03/6733   Procedure: HOLMIUM LASER APPLICATION;  Surgeon: [name], MD;  [facility];  Service: Urology;  Laterality: Right;  . KNEE ARTHROSCOPY Right 11/28/95   [name])  . LEFT HEART CATH AND CORONARY ANGIOGRAPHY N/A 12/14/2017   Procedure: LEFT HEART CATH AND CORONARY ANGIOGRAPHY;  Surgeon: [name], Peter M, MD;  Location: [facility];  Service: Cardiovascular;  Laterality: N/A;    There were no vitals filed for this visit.   Subjective Assessment - 10/13/18 1313    Subjective  Patient reports that about 3 weeks ago he ran off the porch and slipped, falling on some stones directly on the R shoulder. 3 days later went to see PCP to get MRI. Referred to orthopedist, who disagreed with MRI results, saying that it is simply fraying of the RTC rather than a full tear. Pain is located over anterior shoulder/pec region. Reports it has significantly improved since the incident. Worse with holding something with weight overhead., lifting to about 120 deg abduction. Ice and pressure seem to help. Denies N/T, reports intermittent radiation of pain down to elbow level.    Pertinent History  HTN, HLD, GERD, DM, CKD, L heart cath and angio with stent, R knee arthroscopy    Limitations  Lifting;House hold activities    Diagnostic  tests  10/04/18 R shoulder MRI: Large full-thickness supraspinatus and subscapularis tear, severe tendinosis of infraspinatus and subscapularis tendon, partial thickness tear of long head of the biceps tendon    Patient Stated Goals  not having to favor the R arm anymore     Currently in Pain?  Yes    Pain Score  2     Pain Location  Shoulder    Pain Orientation  Right;Anterior    Pain Descriptors / Indicators  Sharp    Pain Type  Acute pain         [facility] PT Assessment - 10/13/18 0001      Assessment   Medical Diagnosis  Pain in R  shoulder    Referring Provider (PT)  Justice Britain, MD    Onset Date/Surgical Date  09/21/18    Hand Dominance  Right    Next MD Visit  --   in 4-5 weeks   Prior Therapy  No      Precautions   Precautions  None      Restrictions   Weight Bearing Restrictions  No      Balance Screen   Has the patient fallen in the past 6 months  Yes    How many times?  1   the incident causing this injury   Has the patient had a decrease in activity level because of a fear of falling?   No    Is the patient reluctant to leave their home because of a fear of falling?   No      Home Film/video editor residence    Living Arrangements  Spouse/significant other    Available Help at Discharge  Family    Type of [place]      Prior Function   Level of [place]  Full time employment    Theatre manager operation, lifting, computer work    Leisure  playing cornhole, golf, fishing, Warehouse manager   Overall Cognitive Status  Within Functional Limits for tasks assessed      Observation/Other Assessments   Focus on Therapeutic Outcomes (FOTO)   Shoulder: 75 (25% limited, 23% predicted)      Sensation   Light Touch  Appears Intact      Coordination   Gross Motor Movements are Fluid and Coordinated  Yes      Posture/Postural Control   Posture/Postural Control  Postural limitations    Postural Limitations  Rounded Shoulders;Forward head   R shoulder slightly depressed     ROM / Strength   AROM / PROM / Strength  AROM;PROM;Strength      AROM   AROM Assessment Site  Shoulder    Right/Left Shoulder  Left;Right    Right Shoulder Flexion  160 Degrees   3/10 pain   Right Shoulder ABduction  152 Degrees   1/10 pain   Right Shoulder Internal Rotation  27 Degrees   5/10 pain   Right Shoulder External Rotation  89 Degrees   2/10   Left Shoulder Flexion  165 Degrees    Left Shoulder ABduction  168 Degrees     Left Shoulder Internal Rotation  55 Degrees    Left Shoulder External Rotation  120 Degrees      Strength   Strength Assessment Site  Shoulder    Right/Left Shoulder  Right;Left    Right Shoulder Flexion  4+/5  Right Shoulder ABduction  4+/5    Right Shoulder Internal Rotation  4/5   2/10 pain   Right Shoulder External Rotation  4/5   3/10 pain   Left Shoulder Flexion  4+/5    Left Shoulder ABduction  4+/5    Left Shoulder Internal Rotation  4+/5    Left Shoulder External Rotation  4/5      Palpation   Palpation comment  TTP in R anterior chest at pec insertion, mild tenderness along proximal biceps tendon                Objective measurements completed on examination: See above findings.              PT Education - 10/13/18 1402    Education Details  prognosis, POC, HEP; administered yellow TB    Person(s) Educated  Patient    Methods  Explanation;Demonstration;Tactile cues;Verbal cues;Handout    Comprehension  Verbalized understanding;Returned demonstration       PT Short Term Goals - 10/13/18 1410      PT SHORT TERM GOAL #1   Title  Patient to be independent with initial HEP.    Time  3    Period  Weeks    Status  New    Target Date  11/03/18        PT Long Term Goals - 10/13/18 1410      PT LONG TERM GOAL #1   Title  Patient to be independent with advanced HEP.    Time  5    Period  Weeks    Status  New    Target Date  11/17/18      PT LONG TERM GOAL #2   Title  Patient to demonstrate [facility] and pain-free R shoulder AROM.    Time  5    Period  Weeks    Status  New    Target Date  11/17/18      PT LONG TERM GOAL #3   Title  Patient to demonstrate >=4+/5 strength in R shoulder.     Time  5    Period  Weeks    Status  New    Target Date  11/17/18      PT LONG TERM GOAL #4   Title  Patient to demonstrate R shoulder overhead lifting with 7# without pain limiting.    Time  5    Period  Weeks    Status  New    Target Date   11/17/18             Plan - 10/13/18 1403    Clinical Impression Statement  Patient is a 52y/o F presenting to [facility] with c/o R shoulder pain after a fall 3 weeks ago. MRI shows large full thickness supraspinatus and subscapularis as well as partial biceps tear, however patient reports that Orthopedist disagreed with MRI reading, noting only fraying of the tendons and recommending conservative treatment. Pain is located over anterior shoulder/pec region and intermittently radiates down to elbow. Reports it has significantly improved since the incident. Worse with holding something with weight overhead., lifting to about 120 deg abduction. Patient today with painful and limited R shoulder AROM- worst with IR, decreased strength, abnormal posture, and tenderness to R anterior pec and proximal biceps tendon. Educated on gentle stretching and strengthening HEP- patient reported understanding. Would benefit from skilled PT services 1x/week or 5 weeks to address aforementioned impairments.     Clinical Presentation  Stable  Clinical Decision Making  Low    Rehab Potential  Good    Clinical Impairments Affecting Rehab Potential  HTN, HLD, GERD, DM, CKD, L heart cath and angio with stent, R knee arthroscopy    PT Frequency  1x / week    PT Duration  Other (comment)   5 weeks   PT Treatment/Interventions  ADLs/Self Care Home Management;Cryotherapy;Electrical Stimulation;Iontophoresis 4mg /ml Dexamethasone;Functional mobility training;Ultrasound;Moist Heat;Therapeutic activities;Therapeutic exercise;Neuromuscular re-education;Patient/family education;Passive range of motion;Manual techniques;Dry needling;Energy conservation;Splinting;Taping;Vasopneumatic Device    PT Next Visit Plan  reassess HEP    Consulted and Agree with Plan of Care  Patient       Patient will benefit from skilled therapeutic intervention in order to improve the following deficits and impairments:  Decreased range of motion,  Impaired UE functional use, Decreased activity tolerance, Pain, Improper body mechanics, Impaired flexibility, Decreased strength, Postural dysfunction  Visit Diagnosis: Acute pain of right shoulder  Stiffness of right shoulder, not elsewhere classified  Muscle weakness (generalized)  Other symptoms and signs involving the musculoskeletal system     Problem List Patient Active Problem List   Diagnosis Date Noted  . Injury of right rotator cuff 09/21/2018  . Acute pain of right shoulder 09/21/2018  . Type 2 diabetes mellitus without complication, without long-term current use of insulin ([place]) 12/14/2017  . Angina pectoris ([place]) 12/14/2017  . Abnormal CT scan, stomach   . Low back pain 12/06/2014  . Obstructive sleep apnea 11/06/2014  . Metabolic syndrome 95/28/4132  . HTN (hypertension) 04/18/2013  . Hyperlipidemia 04/18/2013  . GERD (gastroesophageal reflux disease) 04/18/2013  . Morbid obesity ([place]) 04/18/2013    Janene Harvey, PT, DPT 10/13/18 2:13 PM   [facility] [address]  [place], [place], [address] Phone: [phone]   Fax:  [phone]  Name: Jimmy Perez. MRN: [id number] Date of Birth: 06-Oct-1966

## 2018-10-14 MED FILL — AMLODIPINE BESYLATE 5 MG TA: 5 | 90 days supply | Qty: 90 | Fill #0

## 2018-10-19 ENCOUNTER — Ambulatory Visit: Payer: No Typology Code available for payment source

## 2018-10-19 DIAGNOSIS — M25511 Pain in right shoulder: Secondary | ICD-10-CM

## 2018-10-19 DIAGNOSIS — M25611 Stiffness of right shoulder, not elsewhere classified: Secondary | ICD-10-CM

## 2018-10-19 DIAGNOSIS — M6281 Muscle weakness (generalized): Secondary | ICD-10-CM

## 2018-10-19 DIAGNOSIS — R29898 Other symptoms and signs involving the musculoskeletal system: Secondary | ICD-10-CM

## 2018-10-19 NOTE — Therapy (Signed)
[facility] [address]  [place], [place], [address] Phone: [phone]   Fax:  [phone]  Physical Therapy Treatment  Patient Details  Name: Jimmy Perez. MRN: [id number] Date of Birth: 12-19-1965 Referring Provider (PT): Justice Britain, MD   Encounter Date: 10/19/2018  PT End of Session - 10/19/18 1124    Visit Number  2    Number of Visits  6    Date for PT Re-Evaluation  11/17/18    Authorization Type  [facility]    PT Start Time  1104    PT Stop Time  1147    PT Time Calculation (min)  43 min    Activity Tolerance  Patient tolerated treatment well;Patient limited by pain    Behavior During Therapy  [facility] for tasks assessed/performed       Past Medical History:  Diagnosis Date  . Borderline diabetes    no meds, watching diet.  . Chronic kidney disease   . Diabetes mellitus without complication ([place])   . GERD (gastroesophageal reflux disease)   . Hyperlipidemia   . Hypertension   . Kidney stones   . OSA (obstructive sleep apnea)    Cpap use--study 12-26-14 Epic suggestive of this.  . Pre-diabetes    "per patient"  . Vitamin D deficiency     Past Surgical History:  Procedure Laterality Date  . CORONARY STENT INTERVENTION N/A 12/14/2017   Procedure: CORONARY STENT INTERVENTION;  Surgeon: [name], Peter M, MD;  Location: [facility];  Service: Cardiovascular;  Laterality: N/A;  . CYSTOSCOPY WITH RETROGRADE PYELOGRAM, URETEROSCOPY AND STENT PLACEMENT Right 05/29/2017   Procedure: CYSTOSCOPY WITH RETROGRADE PYELOGRAM, URETEROSCOPY AND STENT PLACEMENT;  Surgeon: [name], MD;  [facility];  Service: Urology;  Laterality: Right;  . EUS N/A 02/08/2015   Procedure: UPPER ENDOSCOPIC ULTRASOUND (EUS) LINEAR;  Surgeon: [name], MD;  [facility];  Service: Endoscopy;  Laterality: N/A;  . EUS N/A 09/25/2016   Procedure: UPPER ENDOSCOPIC ULTRASOUND (EUS) RADIAL;  Surgeon:  [name], MD;  [facility];  Service: Endoscopy;  Laterality: N/A;  . HOLMIUM LASER APPLICATION Right 11/28/2977   Procedure: HOLMIUM LASER APPLICATION;  Surgeon: [name], MD;  [facility];  Service: Urology;  Laterality: Right;  . KNEE ARTHROSCOPY Right 11/28/95   [name])  . LEFT HEART CATH AND CORONARY ANGIOGRAPHY N/A 12/14/2017   Procedure: LEFT HEART CATH AND CORONARY ANGIOGRAPHY;  Surgeon: [name], Peter M, MD;  Location: [facility];  Service: Cardiovascular;  Laterality: N/A;    There were no vitals filed for this visit.  Subjective Assessment - 10/19/18 1107    Subjective  Doing well with notably less pain over this past week.      Pertinent History  HTN, HLD, GERD, DM, CKD, L heart cath and angio with stent, R knee arthroscopy    Diagnostic tests  10/04/18 R shoulder MRI: Large full-thickness supraspinatus and subscapularis tear, severe tendinosis of infraspinatus and subscapularis tendon, partial thickness tear of long head of the biceps tendon    Patient Stated Goals  not having to favor the R arm anymore     Currently in Pain?  Yes    Pain Score  1     Pain Location  Shoulder    Pain Orientation  Right;Anterior    Pain Descriptors / Indicators  Discomfort    Pain Type  Acute pain    Pain Radiating  Towards  Denies today     Pain Frequency  Intermittent    Aggravating Factors   Lifting out to the side     Pain Relieving Factors  Salonpas patch, rest    Multiple Pain Sites  No                       [facility] Adult PT Treatment/Exercise - 10/19/18 1131      Shoulder Exercises: Supine   Protraction  Right;15 reps    Protraction Limitations  Cues for proper technique     External Rotation  Right;12 reps;Theraband    External Rotation Limitations  isometric step-outs    cues for neutral positioning elbow by side    Internal Rotation  Right;12 reps;Theraband    Theraband Level (Shoulder Internal Rotation)  Level 1 (Yellow)     Internal Rotation Limitations  isometric step-outs   cues for neutral elbow positioning      Shoulder Exercises: Sidelying   External Rotation  Right;10 reps;Strengthening    External Rotation Limitations  Cues for scapular retraction     ABduction  Right;10 reps    ABduction Limitations  scaption; as full abduction painful   well tolerated in scaption/abduction      Shoulder Exercises: Standing   Extension  Both;Theraband;Strengthening    Theraband Level (Shoulder Extension)  Level 2 (Red)    Extension Limitations  Difficulty with scapular retraction thus terminated and performed another set of retraction at doorseal     Retraction  Both;10 reps   5" hold; 2 sets     Shoulder Exercises: Pulleys   Flexion  3 minutes    Flexion Limitations  Cues for painfree ROM provided     Scaption  3 minutes    Scaption Limitations  Cues for painfree ROM provided                PT Short Term Goals - 10/19/18 1124      PT SHORT TERM GOAL #1   Title  Patient to be independent with initial HEP.    Time  3    Period  Weeks    Status  On-going        PT Long Term Goals - 10/19/18 1125      PT LONG TERM GOAL #1   Title  Patient to be independent with advanced HEP.    Time  5    Period  Weeks    Status  On-going      PT LONG TERM GOAL #2   Title  Patient to demonstrate [facility] and pain-free R shoulder AROM.    Time  5    Period  Weeks    Status  On-going      PT LONG TERM GOAL #3   Title  Patient to demonstrate >=4+/5 strength in R shoulder.     Time  5    Period  Weeks    Status  On-going      PT LONG TERM GOAL #4   Title  Patient to demonstrate R shoulder overhead lifting with 7# without pain limiting.    Time  5    Period  Weeks    Status  On-going            Plan - 10/19/18 1125    Clinical Impression Statement  Pt. doing well today however notes he'd like to review HEP ER isometric step-out activity today to check technique.  Minor cueing required with this  activity for neutral shoulder positioning with review today.  Tolerated all other RTC/scapular strengthening activities well today.  Required heavy cueing for scapular retraction without elevation today and will benefit from further skilled work with this activity.  Some improvement following mirror feedback and tactile cueing today.  Will continue to progress toward goals.      Rehab Potential  Good    Clinical Impairments Affecting Rehab Potential  HTN, HLD, GERD, DM, CKD, L heart cath and angio with stent, R knee arthroscopy    PT Frequency  1x / week    PT Duration  Other (comment)   5 weeks    PT Treatment/Interventions  ADLs/Self Care Home Management;Cryotherapy;Electrical Stimulation;Iontophoresis 4mg /ml Dexamethasone;Functional mobility training;Ultrasound;Moist Heat;Therapeutic activities;Therapeutic exercise;Neuromuscular re-education;Patient/family education;Passive range of motion;Manual techniques;Dry needling;Energy conservation;Splinting;Taping;Vasopneumatic Device    PT Next Visit Plan  Progress gentle RTC strengthening per pt. tolerance; scapular mechanics     Consulted and Agree with Plan of Care  Patient       Patient will benefit from skilled therapeutic intervention in order to improve the following deficits and impairments:  Decreased range of motion, Impaired UE functional use, Decreased activity tolerance, Pain, Improper body mechanics, Impaired flexibility, Decreased strength, Postural dysfunction  Visit Diagnosis: Acute pain of right shoulder  Stiffness of right shoulder, not elsewhere classified  Muscle weakness (generalized)  Other symptoms and signs involving the musculoskeletal system     Problem List Patient Active Problem List   Diagnosis Date Noted  . Injury of right rotator cuff 09/21/2018  . Acute pain of right shoulder 09/21/2018  . Type 2 diabetes mellitus without complication, without long-term current use of insulin ([place]) 12/14/2017  . Angina  pectoris ([place]) 12/14/2017  . Abnormal CT scan, stomach   . Low back pain 12/06/2014  . Obstructive sleep apnea 11/06/2014  . Metabolic syndrome 50/35/4656  . HTN (hypertension) 04/18/2013  . Hyperlipidemia 04/18/2013  . GERD (gastroesophageal reflux disease) 04/18/2013  . Morbid obesity ([place]) 04/18/2013    Bess Harvest, PTA 10/19/18 12:09 PM    [facility] [address]  [place], [place], [address] Phone: [phone]   Fax:  [phone]  Name: Jimmy Perez. MRN: [id number] Date of Birth: 1966-08-28

## 2018-10-26 ENCOUNTER — Encounter: Payer: Self-pay | Admitting: Physical Therapy

## 2018-10-26 ENCOUNTER — Ambulatory Visit: Payer: No Typology Code available for payment source | Admitting: Physical Therapy

## 2018-10-26 DIAGNOSIS — M25611 Stiffness of right shoulder, not elsewhere classified: Secondary | ICD-10-CM

## 2018-10-26 DIAGNOSIS — M25511 Pain in right shoulder: Secondary | ICD-10-CM | POA: Diagnosis not present

## 2018-10-26 DIAGNOSIS — M6281 Muscle weakness (generalized): Secondary | ICD-10-CM

## 2018-10-26 DIAGNOSIS — R29898 Other symptoms and signs involving the musculoskeletal system: Secondary | ICD-10-CM

## 2018-10-26 NOTE — Therapy (Signed)
[facility] [address]  [place], [place], [address] Phone: [phone]   Fax:  [phone]  Physical Therapy Treatment  Patient Details  Name: Neeraj Carlas Vandyne. MRN: [id number] Date of Birth: 11/16/1965 Referring Provider (PT): Justice Britain, MD   Encounter Date: 10/26/2018  PT End of Session - 10/26/18 1415    Visit Number  3    Number of Visits  6    Date for PT Re-Evaluation  11/17/18    Authorization Type  [facility]    PT Start Time  1318    PT Stop Time  1359    PT Time Calculation (min)  41 min    Activity Tolerance  Patient tolerated treatment well    Behavior During Therapy  [facility] for tasks assessed/performed       Past Medical History:  Diagnosis Date  . Borderline diabetes    no meds, watching diet.  . Chronic kidney disease   . Diabetes mellitus without complication ([place])   . GERD (gastroesophageal reflux disease)   . Hyperlipidemia   . Hypertension   . Kidney stones   . OSA (obstructive sleep apnea)    Cpap use--study 12-26-14 Epic suggestive of this.  . Pre-diabetes    "per patient"  . Vitamin D deficiency     Past Surgical History:  Procedure Laterality Date  . CORONARY STENT INTERVENTION N/A 12/14/2017   Procedure: CORONARY STENT INTERVENTION;  Surgeon: [name], Peter M, MD;  Location: [place] [facility];  Service: Cardiovascular;  Laterality: N/A;  . CYSTOSCOPY WITH RETROGRADE PYELOGRAM, URETEROSCOPY AND STENT PLACEMENT Right 05/29/2017   Procedure: CYSTOSCOPY WITH RETROGRADE PYELOGRAM, URETEROSCOPY AND STENT PLACEMENT;  Surgeon: [name], MD;  [facility]: [facility];  Service: Urology;  Laterality: Right;  . EUS N/A 02/08/2015   Procedure: UPPER ENDOSCOPIC ULTRASOUND (EUS) LINEAR;  Surgeon: [name], MD;  [facility];  Service: Endoscopy;  Laterality: N/A;  . EUS N/A 09/25/2016   Procedure: UPPER ENDOSCOPIC ULTRASOUND (EUS) RADIAL;  Surgeon: [name], MD;   [facility];  Service: Endoscopy;  Laterality: N/A;  . HOLMIUM LASER APPLICATION Right 04/03/5915   Procedure: HOLMIUM LASER APPLICATION;  Surgeon: [name], MD;  [facility];  Service: Urology;  Laterality: Right;  . KNEE ARTHROSCOPY Right 11/28/95   [name])  . LEFT HEART CATH AND CORONARY ANGIOGRAPHY N/A 12/14/2017   Procedure: LEFT HEART CATH AND CORONARY ANGIOGRAPHY;  Surgeon: [name], Peter M, MD;  Location: [place] [facility];  Service: Cardiovascular;  Laterality: N/A;    There were no vitals filed for this visit.  Subjective Assessment - 10/26/18 1318    Subjective  Reports that his shoulder gets better and better each day. HEP has been going well.     Pertinent History  HTN, HLD, GERD, DM, CKD, L heart cath and angio with stent, R knee arthroscopy    Diagnostic tests  10/04/18 R shoulder MRI: Large full-thickness supraspinatus and subscapularis tear, severe tendinosis of infraspinatus and subscapularis tendon, partial thickness tear of long head of the biceps tendon    Patient Stated Goals  not having to favor the R arm anymore     Currently in Pain?  No/denies                       [facility] Adult PT Treatment/Exercise - 10/26/18 0001      Exercises   Exercises  Shoulder  Shoulder Exercises: Supine   Protraction  Strengthening;Right;Left;Weights;15 reps    Protraction Weight (lbs)  3    Protraction Limitations  2x15; cues to maintain elbow straight    Flexion  Strengthening;Right;10 reps;Weights    Shoulder Flexion Weight (lbs)  1    Flexion Limitations  cues to decrease speed and increase control      Shoulder Exercises: Prone   Retraction  Strengthening;Right;10 reps;Limitations    Retraction Limitations  2x10; 2nd set with 2#; prone row with cues for scapular retraction and avoiding elevation    Extension  Strengthening;Right;10 reps;Limitations    Extension Limitations  2x10; 2nd set with 1#; prone extension with cues for  scapular retraction      Shoulder Exercises: Sidelying   External Rotation  Right;10 reps;Strengthening;AROM    External Rotation Limitations  dowel under elbow for neutral positioning; Cues for scapular retraction     ABduction  Right;10 reps;AROM    ABduction Limitations  thumb up; cues for control and speed   mild c/o discomfort in R forearm     Shoulder Exercises: Standing   External Rotation  Strengthening;Right;10 reps;Theraband    Theraband Level (Shoulder External Rotation)  Level 2 (Red)    External Rotation Limitations  ER step outs with dowel under elbow    Internal Rotation  Strengthening;Right;10 reps;Theraband    Theraband Level (Shoulder Internal Rotation)  Level 2 (Red)    Internal Rotation Limitations  IR step outs with dowel under elbow    Retraction  Strengthening;Both;15 reps   at door     Shoulder Exercises: Pulleys   Flexion  3 minutes    Flexion Limitations  to tolerance   cues to avoid quick jerking motion   Scaption  3 minutes    Scaption Limitations  to tolerance      Shoulder Exercises: Stretch   Other Shoulder Stretches  R shoulder IR stretch with strap 10x5"             PT Education - 10/26/18 1415    Education Details  update to HEP; administered red TB    Person(s) Educated  Patient    Methods  Explanation;Demonstration;Tactile cues;Verbal cues;Handout    Comprehension  Verbalized understanding;Returned demonstration       PT Short Term Goals - 10/19/18 1124      PT SHORT TERM GOAL #1   Title  Patient to be independent with initial HEP.    Time  3    Period  Weeks    Status  On-going        PT Long Term Goals - 10/19/18 1125      PT LONG TERM GOAL #1   Title  Patient to be independent with advanced HEP.    Time  5    Period  Weeks    Status  On-going      PT LONG TERM GOAL #2   Title  Patient to demonstrate [facility] and pain-free R shoulder AROM.    Time  5    Period  Weeks    Status  On-going      PT LONG TERM GOAL #3    Title  Patient to demonstrate >=4+/5 strength in R shoulder.     Time  5    Period  Weeks    Status  On-going      PT LONG TERM GOAL #4   Title  Patient to demonstrate R shoulder overhead lifting with 7# without pain limiting.    Time  5    Period  Weeks    Status  On-going            Plan - 10/26/18 1421    Clinical Impression Statement  Patient arrived to session with report of continual improvement in R shoulder. Progressed serratus punches today with light resistance and cues to maintain elbows straight. Introduced supine R shoulder flexion with 1 lb weight with cues for increased control and decreased speed. Patient still with difficulty activating rhomboids with cues for scapular retraction, with tendency to arch back into extension instead. Mildly improvement in activation with prone row and extension, however manual guidance still required to avoid shoulder elevation. Updated HEP to progress IR/ER isometric step outs with red TB and added prone row and extension. Patient reported understanding and with no complaints at end of session.     Clinical Impairments Affecting Rehab Potential  HTN, HLD, GERD, DM, CKD, L heart cath and angio with stent, R knee arthroscopy    PT Treatment/Interventions  ADLs/Self Care Home Management;Cryotherapy;Electrical Stimulation;Iontophoresis 4mg /ml Dexamethasone;Functional mobility training;Ultrasound;Moist Heat;Therapeutic activities;Therapeutic exercise;Neuromuscular re-education;Patient/family education;Passive range of motion;Manual techniques;Dry needling;Energy conservation;Splinting;Taping;Vasopneumatic Device    PT Next Visit Plan  Progress gentle RTC strengthening per pt. tolerance; scapular mechanics     Consulted and Agree with Plan of Care  Patient       Patient will benefit from skilled therapeutic intervention in order to improve the following deficits and impairments:  Decreased range of motion, Impaired UE functional use, Decreased  activity tolerance, Pain, Improper body mechanics, Impaired flexibility, Decreased strength, Postural dysfunction  Visit Diagnosis: Acute pain of right shoulder  Stiffness of right shoulder, not elsewhere classified  Muscle weakness (generalized)  Other symptoms and signs involving the musculoskeletal system     Problem List Patient Active Problem List   Diagnosis Date Noted  . Injury of right rotator cuff 09/21/2018  . Acute pain of right shoulder 09/21/2018  . Type 2 diabetes mellitus without complication, without long-term current use of insulin ([place]) 12/14/2017  . Angina pectoris ([place]) 12/14/2017  . Abnormal CT scan, stomach   . Low back pain 12/06/2014  . Obstructive sleep apnea 11/06/2014  . Metabolic syndrome 54/49/2010  . HTN (hypertension) 04/18/2013  . Hyperlipidemia 04/18/2013  . GERD (gastroesophageal reflux disease) 04/18/2013  . Morbid obesity ([place]) 04/18/2013    Janene Harvey, PT, DPT 10/26/18 2:27 PM   [facility] [address]  [place], [place], [address] Phone: [phone]   Fax:  [phone]  Name: Derik Fults. MRN: [id number] Date of Birth: 01/16/1966

## 2018-11-03 ENCOUNTER — Ambulatory Visit: Payer: No Typology Code available for payment source | Attending: Orthopedic Surgery

## 2018-11-03 DIAGNOSIS — M25511 Pain in right shoulder: Secondary | ICD-10-CM | POA: Insufficient documentation

## 2018-11-03 DIAGNOSIS — M6281 Muscle weakness (generalized): Secondary | ICD-10-CM | POA: Diagnosis present

## 2018-11-03 DIAGNOSIS — R29898 Other symptoms and signs involving the musculoskeletal system: Secondary | ICD-10-CM | POA: Diagnosis present

## 2018-11-03 DIAGNOSIS — M25611 Stiffness of right shoulder, not elsewhere classified: Secondary | ICD-10-CM | POA: Diagnosis present

## 2018-11-03 NOTE — Therapy (Signed)
[facility] [address]  [place], [place], [address] Phone: [phone]   Fax:  [phone]  Physical Therapy Treatment  Patient Details  Name: Jimmy Perez. MRN: [id number] Date of Birth: 1966/08/06 Referring Provider (PT): Justice Britain, MD   Encounter Date: 11/03/2018  PT End of Session - 11/03/18 1319    Visit Number  4    Number of Visits  6    Date for PT Re-Evaluation  11/17/18    Authorization Type  [facility]    PT Start Time  1315    PT Stop Time  1355    PT Time Calculation (min)  40 min    Activity Tolerance  Patient tolerated treatment well    Behavior During Therapy  [facility] for tasks assessed/performed       Past Medical History:  Diagnosis Date  . Borderline diabetes    no meds, watching diet.  . Chronic kidney disease   . Diabetes mellitus without complication ([place])   . GERD (gastroesophageal reflux disease)   . Hyperlipidemia   . Hypertension   . Kidney stones   . OSA (obstructive sleep apnea)    Cpap use--study 12-26-14 Epic suggestive of this.  . Pre-diabetes    "per patient"  . Vitamin D deficiency     Past Surgical History:  Procedure Laterality Date  . CORONARY STENT INTERVENTION N/A 12/14/2017   Procedure: CORONARY STENT INTERVENTION;  Surgeon: [name], Peter M, MD;  Location: [place] [facility];  Service: Cardiovascular;  Laterality: N/A;  . CYSTOSCOPY WITH RETROGRADE PYELOGRAM, URETEROSCOPY AND STENT PLACEMENT Right 05/29/2017   Procedure: CYSTOSCOPY WITH RETROGRADE PYELOGRAM, URETEROSCOPY AND STENT PLACEMENT;  Surgeon: [name], MD;  [facility];  Service: Urology;  Laterality: Right;  . EUS N/A 02/08/2015   Procedure: UPPER ENDOSCOPIC ULTRASOUND (EUS) LINEAR;  Surgeon: [name], MD;  [facility];  Service: Endoscopy;  Laterality: N/A;  . EUS N/A 09/25/2016   Procedure: UPPER ENDOSCOPIC ULTRASOUND (EUS) RADIAL;  Surgeon: [name], MD;   [facility];  Service: Endoscopy;  Laterality: N/A;  . HOLMIUM LASER APPLICATION Right 05/05/239   Procedure: HOLMIUM LASER APPLICATION;  Surgeon: [name], MD;  [facility];  Service: Urology;  Laterality: Right;  . KNEE ARTHROSCOPY Right 11/28/95   [name])  . LEFT HEART CATH AND CORONARY ANGIOGRAPHY N/A 12/14/2017   Procedure: LEFT HEART CATH AND CORONARY ANGIOGRAPHY;  Surgeon: [name], Peter M, MD;  Location: [place] [facility];  Service: Cardiovascular;  Laterality: N/A;    There were no vitals filed for this visit.  Subjective Assessment - 11/03/18 1317    Subjective  Pt. reporting he has some pain remaining with overhead lifting items however notes "huge improvement" in R shoulder status.      Pertinent History  HTN, HLD, GERD, DM, CKD, L heart cath and angio with stent, R knee arthroscopy    Diagnostic tests  10/04/18 R shoulder MRI: Large full-thickness supraspinatus and subscapularis tear, severe tendinosis of infraspinatus and subscapularis tendon, partial thickness tear of long head of the biceps tendon    Patient Stated Goals  not having to favor the R arm anymore     Currently in Pain?  No/denies    Pain Score  0-No pain   pain rising to 2/10 at worst lifting items "with weight" overhead   Pain Location  Shoulder         [facility] PT Assessment -  11/03/18 0001      Assessment   Medical Diagnosis  Pain in R shoulder    Referring Provider (PT)  Justice Britain, MD    Onset Date/Surgical Date  09/21/18    Hand Dominance  Right    Next MD Visit  1.13.20    Prior Therapy  No      AROM   AROM Assessment Site  Shoulder    Right/Left Shoulder  Right    Right Shoulder Flexion  162 Degrees    Right Shoulder ABduction  165 Degrees    Right Shoulder Internal Rotation  --   FIR to T10   Right Shoulder External Rotation  --   FER to T5     PROM   Right/Left Shoulder  Right    Right Shoulder Flexion  175 Degrees    Right Shoulder ABduction  150 Degrees     Right Shoulder Internal Rotation  75 Degrees    Right Shoulder External Rotation  92 Degrees      Strength   Strength Assessment Site  Shoulder    Right/Left Shoulder  Right;Left    Right Shoulder Flexion  4+/5    Right Shoulder ABduction  4+/5    Right Shoulder Internal Rotation  4+/5    Right Shoulder External Rotation  4/5    Left Shoulder Flexion  4+/5    Left Shoulder ABduction  4+/5    Left Shoulder Internal Rotation  4+/5    Left Shoulder External Rotation  4/5                   [facility] Adult PT Treatment/Exercise - 11/03/18 1334      Shoulder Exercises: Supine   Protraction  Both;20 reps    Protraction Weight (lbs)  4    Protraction Limitations  Cues for slow motion       Shoulder Exercises: Standing   External Rotation  Right;Theraband;15 reps;Strengthening    External Rotation Limitations  Cues required for scapular retraction     Internal Rotation  Right;15 reps;Strengthening;Theraband    Internal Rotation Limitations  IR step outs with dowel under elbow    Row  Both;15 reps    Theraband Level (Shoulder Row)  Level 2 (Red)    Row Limitations  scapular retraction       Shoulder Exercises: Pulleys   Flexion  3 minutes    Flexion Limitations  to tolerance   Continues to require cueing for slow pacing    Scaption  3 minutes    Scaption Limitations  to tolerance      Shoulder Exercises: ROM/Strengthening   Wall Pushups  10 reps    Wall Pushups Limitations  cues for elbows into body                PT Short Term Goals - 11/03/18 1320      PT SHORT TERM GOAL #1   Title  Patient to be independent with initial HEP.    Time  3    Period  Weeks    Status  Achieved        PT Long Term Goals - 11/03/18 1352      PT LONG TERM GOAL #1   Title  Patient to be independent with advanced HEP.    Time  5    Period  Weeks    Status  Partially Met      PT LONG TERM GOAL #2   Title  Patient to demonstrate  WFL and pain-free R shoulder AROM.     Time  5    Period  Weeks    Status  Partially Met      PT LONG TERM GOAL #3   Title  Patient to demonstrate >=4+/5 strength in R shoulder.     Time  5    Period  Weeks    Status  Partially Met      PT LONG TERM GOAL #4   Title  Patient to demonstrate R shoulder overhead lifting with 7# without pain limiting.    Time  5    Period  Weeks    Status  Achieved            Plan - 11/03/18 1322    Clinical Impression Statement  Pt. has made excellent progress with therapy.  Able to partially meet R shoulder AROM goal today now only demonstrating slight limitation in functional internal rotation behind back to T10 spinal level.  Able to partially achieve strength goal today with MMT met for all motions except R ER still at 4/5 strength.  Pt. able to progress overhead lifting today to 7# dumbbell with R shoulder to second shelf over counter without pain achieving this goal.  Pt. to see MD for f/u early next week.  Progressing well toward goals LTG's.      Rehab Potential  Good    Clinical Impairments Affecting Rehab Potential  HTN, HLD, GERD, DM, CKD, L heart cath and angio with stent, R knee arthroscopy    PT Frequency  1x / week    PT Duration  Other (comment)    PT Treatment/Interventions  ADLs/Self Care Home Management;Cryotherapy;Electrical Stimulation;Iontophoresis 62m/ml Dexamethasone;Functional mobility training;Ultrasound;Moist Heat;Therapeutic activities;Therapeutic exercise;Neuromuscular re-education;Patient/family education;Passive range of motion;Manual techniques;Dry needling;Energy conservation;Splinting;Taping;Vasopneumatic Device    PT Next Visit Plan  Progress gentle RTC strengthening per pt. tolerance; scapular mechanics     Consulted and Agree with Plan of Care  Patient       Patient will benefit from skilled therapeutic intervention in order to improve the following deficits and impairments:  Decreased range of motion, Impaired UE functional use, Decreased activity  tolerance, Pain, Improper body mechanics, Impaired flexibility, Decreased strength, Postural dysfunction  Visit Diagnosis: Acute pain of right shoulder  Stiffness of right shoulder, not elsewhere classified  Muscle weakness (generalized)  Other symptoms and signs involving the musculoskeletal system     Problem List Patient Active Problem List   Diagnosis Date Noted  . Injury of right rotator cuff 09/21/2018  . Acute pain of right shoulder 09/21/2018  . Type 2 diabetes mellitus without complication, without long-term current use of insulin ([place] 12/14/2017  . [place] ([place] 12/14/2017  . Abnormal CT scan, stomach   . Low back pain 12/06/2014  . Obstructive sleep apnea 11/06/2014  . Metabolic syndrome 006/30/1601 . HTN (hypertension) 04/18/2013  . Hyperlipidemia 04/18/2013  . GERD (gastroesophageal reflux disease) 04/18/2013  . [place]bid [place] ([place] 04/18/2013    MBess Harvest PTA 11/03/18 3:31 PM    [facility] [address] [place] [address]Phone: [phone]  Fax:  [phone] Name: Price KGurkaran Rahm MRN: [id number] of Birth: [birth year]

## 2018-11-08 ENCOUNTER — Encounter: Payer: Self-pay | Admitting: Family Medicine

## 2018-11-08 ENCOUNTER — Other Ambulatory Visit: Payer: Self-pay | Admitting: *Deleted

## 2018-11-08 MED ORDER — PANTOPRAZOLE SODIUM 40 MG PO TBEC: 40.0000 mg | DELAYED_RELEASE_TABLET | Freq: Two times a day (BID) | ORAL | 3 refills | Status: DC

## 2018-11-08 MED FILL — PANTOPRAZOLE SOD DR 40 MG T: 40 | 90 days supply | Qty: 90 | Fill #0

## 2018-11-08 MED FILL — CLOPIDOGREL 75 MG TABLET: 75 | 90 days supply | Qty: 90 | Fill #2

## 2018-11-08 NOTE — Telephone Encounter (Signed)
Pharmacy says if this medicine,(pantoprazole) , is increased to two daily , it will interact with the plavix.   Please advise.

## 2018-11-10 ENCOUNTER — Ambulatory Visit: Payer: No Typology Code available for payment source

## 2018-11-10 DIAGNOSIS — M6281 Muscle weakness (generalized): Secondary | ICD-10-CM

## 2018-11-10 DIAGNOSIS — M25611 Stiffness of right shoulder, not elsewhere classified: Secondary | ICD-10-CM

## 2018-11-10 DIAGNOSIS — R29898 Other symptoms and signs involving the musculoskeletal system: Secondary | ICD-10-CM

## 2018-11-10 DIAGNOSIS — M25511 Pain in right shoulder: Secondary | ICD-10-CM | POA: Diagnosis not present

## 2018-11-10 NOTE — Therapy (Signed)
[facility] [address]  [place], [place], [address] Phone: [phone]   Fax:  [phone]  Physical Therapy Treatment  Patient Details  Name: Jimmy Perez. MRN: [id number] Date of Birth: 1966/01/09 Referring Provider (PT): Justice Britain, MD   Encounter Date: 11/10/2018  PT End of Session - 11/10/18 1320    Visit Number  5    Number of Visits  6    Date for PT Re-Evaluation  11/17/18    Authorization Type  [facility]    PT Start Time  7262    PT Stop Time  1358    PT Time Calculation (min)  41 min    Activity Tolerance  Patient tolerated treatment well    Behavior During Therapy  [facility] for tasks assessed/performed       Past Medical History:  Diagnosis Date  . Borderline diabetes    no meds, watching diet.  . Chronic kidney disease   . Diabetes mellitus without complication ([place])   . GERD (gastroesophageal reflux disease)   . Hyperlipidemia   . Hypertension   . Kidney stones   . OSA (obstructive sleep apnea)    Cpap use--study 12-26-14 Epic suggestive of this.  . Pre-diabetes    "per patient"  . Vitamin D deficiency     Past Surgical History:  Procedure Laterality Date  . CORONARY STENT INTERVENTION N/A 12/14/2017   Procedure: CORONARY STENT INTERVENTION;  Surgeon: [name], Peter M, MD;  Location: [place] [facility];  Service: Cardiovascular;  Laterality: N/A;  . CYSTOSCOPY WITH RETROGRADE PYELOGRAM, URETEROSCOPY AND STENT PLACEMENT Right 05/29/2017   Procedure: CYSTOSCOPY WITH RETROGRADE PYELOGRAM, URETEROSCOPY AND STENT PLACEMENT;  Surgeon: [name], MD;  [facility];  Service: Urology;  Laterality: Right;  . EUS N/A 02/08/2015   Procedure: UPPER ENDOSCOPIC ULTRASOUND (EUS) LINEAR;  Surgeon: [name], MD;  [facility];  Service: Endoscopy;  Laterality: N/A;  . EUS N/A 09/25/2016   Procedure: UPPER ENDOSCOPIC ULTRASOUND (EUS) RADIAL;  Surgeon: [name], MD;   [facility];  Service: Endoscopy;  Laterality: N/A;  . HOLMIUM LASER APPLICATION Right 0/12/5595   Procedure: HOLMIUM LASER APPLICATION;  Surgeon: [name], MD;  [facility];  Service: Urology;  Laterality: Right;  . KNEE ARTHROSCOPY Right 11/28/95   [name])  . LEFT HEART CATH AND CORONARY ANGIOGRAPHY N/A 12/14/2017   Procedure: LEFT HEART CATH AND CORONARY ANGIOGRAPHY;  Surgeon: [name], Peter M, MD;  Location: [place] [facility];  Service: Cardiovascular;  Laterality: N/A;    There were no vitals filed for this visit.  Subjective Assessment - 11/10/18 1323    Subjective  Pt. reporting he may be ready to finish up with therapy at next visit.     Pertinent History  HTN, HLD, GERD, DM, CKD, L heart cath and angio with stent, R knee arthroscopy    Diagnostic tests  10/04/18 R shoulder MRI: Large full-thickness supraspinatus and subscapularis tear, severe tendinosis of infraspinatus and subscapularis tendon, partial thickness tear of long head of the biceps tendon    Patient Stated Goals  not having to favor the R arm anymore     Currently in Pain?  No/denies    Pain Score  0-No pain    Multiple Pain Sites  No                       [facility] Adult PT Treatment/Exercise - 11/10/18  1331      Shoulder Exercises: Standing   External Rotation  Both;15 reps;Strengthening;Theraband    Theraband Level (Shoulder External Rotation)  Level 1 (Yellow)    External Rotation Limitations  Leaning on doorseal     Row  Both;10 reps;Strengthening;Theraband    Theraband Level (Shoulder Row)  Level 3 (Green)    Row Limitations  Cues required for scapular retraction       Shoulder Exercises: ROM/Strengthening   UBE (Upper Arm Bike)  Lvl 1.0, 3 min forward/3 min backwards     Cybex Row  10 reps    Cybex Row Limitations  25# -     Wall Pushups  15 reps    Wall Pushups Limitations  cues for elbows into body       Shoulder Exercises: Stretch   Internal Rotation Stretch   5 reps   x 30 sec    Internal Rotation Stretch Limitations  Sleeper stretch, standing with golf club, strap behind back       Manual Therapy   Manual Therapy  Soft tissue mobilization    Manual therapy comments  supine     Soft tissue mobilization  STM to R posterior/inferior shoulder              PT Education - 11/10/18 1606    Education Details  HEP update;  green TB issued to pt. for row    Person(s) Educated  Patient    Methods  Explanation;Demonstration;Verbal cues;Handout    Comprehension  Verbalized understanding;Returned demonstration;Verbal cues required;Need further instruction       PT Short Term Goals - 11/03/18 1320      PT SHORT TERM GOAL #1   Title  Patient to be independent with initial HEP.    Time  3    Period  Weeks    Status  Achieved        PT Long Term Goals - 11/03/18 1352      PT LONG TERM GOAL #1   Title  Patient to be independent with advanced HEP.    Time  5    Period  Weeks    Status  Partially Met      PT LONG TERM GOAL #2   Title  Patient to demonstrate [facility] and pain-free R shoulder AROM.    Time  5    Period  Weeks    Status  Partially Met      PT LONG TERM GOAL #3   Title  Patient to demonstrate >=4+/5 strength in R shoulder.     Time  5    Period  Weeks    Status  Partially Met      PT LONG TERM GOAL #4   Title  Patient to demonstrate R shoulder overhead lifting with 7# without pain limiting.    Time  5    Period  Weeks    Status  Achieved            Plan - 11/10/18 1321    Clinical Impression Statement  [name] reporting improvement in R shoulder with improved confidence with reaching activities.  Noting he may be ready to transition to home program at upcoming visit.  Tolerated progression of scapular/RTC strengthening activities and stretches focused on improving R IR well.  HEP updated.  May consider transition to home program per pt. in coming visit.      Rehab Potential  Good    Clinical Impairments Affecting  Rehab Potential  HTN, HLD,  GERD, DM, CKD, L heart cath and angio with stent, R knee arthroscopy    PT Frequency  1x / week    PT Duration  Other (comment)    PT Treatment/Interventions  ADLs/Self Care Home Management;Cryotherapy;Electrical Stimulation;Iontophoresis 76m/ml Dexamethasone;Functional mobility training;Ultrasound;Moist Heat;Therapeutic activities;Therapeutic exercise;Neuromuscular re-education;Patient/family education;Passive range of motion;Manual techniques;Dry needling;Energy conservation;Splinting;Taping;Vasopneumatic Device    PT Next Visit Plan  possible transition to home program    Consulted and Agree with Plan of Care  Patient       Patient will benefit from skilled therapeutic intervention in order to improve the following deficits and impairments:  Decreased range of motion, Impaired UE functional use, Decreased activity tolerance, Pain, Improper body mechanics, Impaired flexibility, Decreased strength, Postural dysfunction  Visit Diagnosis: Acute pain of right shoulder  Stiffness of right shoulder, not elsewhere classified  Muscle weakness (generalized)  Other symptoms and signs involving the musculoskeletal system     Problem List Patient Active Problem List   Diagnosis Date Noted  . Injury of right rotator cuff 09/21/2018  . Acute pain of right shoulder 09/21/2018  . Type 2 diabetes mellitus without complication, without long-term current use of insulin ([place] 12/14/2017  . Angina pectoris ([place] 12/14/2017  . Abnormal CT scan, stomach   . Low back pain 12/06/2014  . Obstructive sleep apnea 11/06/2014  . Metabolic syndrome 060/16/5800 . HTN (hypertension) 04/18/2013  . Hyperlipidemia 04/18/2013  . GERD (gastroesophageal reflux disease) 04/18/2013  . Morbid obesity ([place] 04/18/2013    MBess Harvest PTA 11/10/18 4:09 PM   [facility] [address] [place] [address]Phone:  [phone]  Fax:  [phone] Name: Regie KWillett Lefeber MRN: [id number] of Birth: 107-23-1967

## 2018-11-17 ENCOUNTER — Ambulatory Visit: Payer: No Typology Code available for payment source

## 2018-11-17 DIAGNOSIS — M6281 Muscle weakness (generalized): Secondary | ICD-10-CM

## 2018-11-17 DIAGNOSIS — M25611 Stiffness of right shoulder, not elsewhere classified: Secondary | ICD-10-CM

## 2018-11-17 DIAGNOSIS — M25511 Pain in right shoulder: Secondary | ICD-10-CM | POA: Diagnosis not present

## 2018-11-17 DIAGNOSIS — R29898 Other symptoms and signs involving the musculoskeletal system: Secondary | ICD-10-CM

## 2018-11-17 NOTE — Therapy (Addendum)
Jimmy Perez [address]  Jimmy Perez, Jimmy Perez, Jimmy Perez Phone: [phone]   Fax:  [phone]  Physical Therapy Treatment  Patient Details  Name: Jimmy Perez. MRN: [id number] Date of Birth: May 19, 1966 Referring Provider (PT): Jimmy Britain, MD   Encounter Date: 11/17/2018  PT End of Session - 11/17/18 1327    Visit Number  6    Number of Visits  6    Date for PT Re-Evaluation  11/17/18    Authorization Type  [facility]    PT Start Time  1318    PT Stop Time  1356    PT Time Calculation (min)  38 min    Activity Tolerance  Patient tolerated treatment well    Behavior During Therapy  [facility] for tasks assessed/performed       Past Medical History:  Diagnosis Date  . Borderline diabetes    no meds, watching diet.  . Chronic kidney disease   . Diabetes mellitus without complication ([place] )   . GERD (gastroesophageal reflux disease)   . Hyperlipidemia   . Hypertension   . Kidney stones   . OSA (obstructive sleep apnea)    Cpap use--study 12-26-14 Epic suggestive of this.  . Pre-diabetes    "per patient"  . Vitamin D deficiency     Past Surgical History:  Procedure Laterality Date  . CORONARY STENT INTERVENTION N/A 12/14/2017   Procedure: CORONARY STENT INTERVENTION;  Surgeon: Perez, Jimmy M, MD;  Location: [place] [facility];  Service: Cardiovascular;  Laterality: N/A;  . CYSTOSCOPY WITH RETROGRADE PYELOGRAM, URETEROSCOPY AND STENT PLACEMENT Right 05/29/2017   Procedure: CYSTOSCOPY WITH RETROGRADE PYELOGRAM, URETEROSCOPY AND STENT PLACEMENT;  Surgeon: Jimmy Frock, MD;  [facility];  Service: Urology;  Laterality: Right;  . EUS N/A 02/08/2015   Procedure: UPPER ENDOSCOPIC ULTRASOUND (EUS) LINEAR;  Surgeon: Jimmy Banister, MD;  [facility];  Service: Endoscopy;  Laterality: N/A;  . EUS N/A 09/25/2016   Procedure: UPPER ENDOSCOPIC ULTRASOUND (EUS) RADIAL;  Surgeon: Jimmy Banister, MD;   [facility];  Service: Endoscopy;  Laterality: N/A;  . HOLMIUM LASER APPLICATION Right 03/27/4708   Procedure: HOLMIUM LASER APPLICATION;  Surgeon: Jimmy Frock, MD;  [facility];  Service: Urology;  Laterality: Right;  . KNEE ARTHROSCOPY Right 11/28/95   Jimmy Perez)  . LEFT HEART CATH AND CORONARY ANGIOGRAPHY N/A 12/14/2017   Procedure: LEFT HEART CATH AND CORONARY ANGIOGRAPHY;  Surgeon: Perez, Jimmy M, MD;  Location: [place] [facility];  Service: Cardiovascular;  Laterality: N/A;    There were no vitals filed for this visit.  Subjective Assessment - 11/17/18 1326    Subjective  Pt. reporting he wishes to transition to home program from therapy.      Pertinent History  HTN, HLD, GERD, DM, CKD, L heart cath and angio with stent, R knee arthroscopy    Diagnostic tests  10/04/18 R shoulder MRI: Large full-thickness supraspinatus and subscapularis tear, severe tendinosis of infraspinatus and subscapularis tendon, partial thickness tear of long head of the biceps tendon    Patient Stated Goals  not having to favor the R arm anymore     Currently in Pain?  No/denies    Pain Score  0-No pain    Multiple Pain Sites  No         [facility] PT Assessment - 11/17/18 1330      AROM   AROM Assessment Site  Shoulder  Right/Left Shoulder  Right    Right Shoulder Internal Rotation  --   FIR T9   Right Shoulder External Rotation  --   FER to T4   Left Shoulder Flexion  165 Degrees    Left Shoulder ABduction  168 Degrees    Left Shoulder Internal Rotation  --   FIR to T8   Left Shoulder External Rotation  --   FER to T4     Strength   Strength Assessment Site  Shoulder    Right/Left Shoulder  Right;Left    Right Shoulder Flexion  5/5    Right Shoulder ABduction  5/5    Right Shoulder Internal Rotation  4/5    Right Shoulder External Rotation  4+/5    Left Shoulder Flexion  5/5    Left Shoulder ABduction  5/5    Left Shoulder Internal Rotation  4+/5    Left Shoulder  External Rotation  4+/5                   [facility] Adult PT Treatment/Exercise - 11/17/18 0001      Self-Care   Self-Care  Other Self-Care Comments    Other Self-Care Comments   Verbal review of comprehensive HEP with pt. verbalizing understanding       Shoulder Exercises: Supine   Protraction  Both;20 reps    Protraction Weight (lbs)  5    Protraction Limitations  Cues for slow motion     Other Supine Exercises  R shoulder ABC's with yellow med ball (2000Gr) x 1 min       Shoulder Exercises: Standing   External Rotation  Right;10 reps;Theraband    Theraband Level (Shoulder External Rotation)  Level 3 (Green)    Internal Rotation  Right;10 reps    Theraband Level (Shoulder Internal Rotation)  Level 3 (Green)    Other Standing Exercises  R flexion, scaption 5# x 30 sec     Other Standing Exercises  R standing biceps and triceps blue TB x 10 reps each way       Shoulder Exercises: ROM/Strengthening   UBE (Upper Arm Bike)  Lvl 1.0, 3 min forward/3 min backwards     Cybex Row  10 reps    Cybex Row Limitations  25# - low handles              PT Education - 11/17/18 1414    Education Details  HEP update; blue TB issued to pt. for biceps curl per request     Person(s) Educated  Patient    Methods  Explanation;Demonstration;Verbal cues;Handout    Comprehension  Verbalized understanding;Returned demonstration;Verbal cues required;Need further instruction       PT Short Term Goals - 11/03/18 1320      PT SHORT TERM GOAL #1   Title  Patient to be independent with initial HEP.    Time  3    Period  Weeks    Status  Achieved        PT Long Term Goals - 11/17/18 1341      PT LONG TERM GOAL #1   Title  Patient to be independent with advanced HEP.    Time  5    Period  Weeks    Status  Achieved      PT LONG TERM GOAL #2   Title  Patient to demonstrate [facility] and pain-free R shoulder AROM.    Time  5    Period  Weeks  Status  Partially Met      PT LONG TERM  GOAL #3   Title  Patient to demonstrate >=4+/5 strength in R shoulder.     Time  5    Period  Weeks    Status  Partially Met   11/17/18: still R IR 4/5 strength      PT LONG TERM GOAL #4   Title  Patient to demonstrate R shoulder overhead lifting with 7# without pain limiting.    Time  5    Period  Weeks    Status  Achieved            Plan - 11/17/18 1327    Clinical Impression Statement  Pt. has made good progress with therapy.  Reports no recent pain or limitation at home or with work tasks from R shoulder.  Reports, "I've got my confidence back now".  Able to partially meet strength goal with MMT today only 4/5 strength now in R shoulder IR strength.  Able to achieve or partially achieve all LTG's with therapy at this Perez.  Pt. reporting he wishes to go on hold from therapy and focus on home program.  Supervising PT approving this plan for 30-day hold.  Pt. now on 30-day hold.     Rehab Potential  Good    Clinical Impairments Affecting Rehab Potential  HTN, HLD, GERD, DM, CKD, L heart cath and angio with stent, R knee arthroscopy    PT Frequency  1x / week    PT Duration  Other (comment)    PT Treatment/Interventions  ADLs/Self Care Home Management;Cryotherapy;Electrical Stimulation;Iontophoresis 47m/ml Dexamethasone;Functional mobility training;Ultrasound;Moist Heat;Therapeutic activities;Therapeutic exercise;Neuromuscular re-education;Patient/family education;Passive range of motion;Manual techniques;Dry needling;Energy conservation;Splinting;Taping;Vasopneumatic Device    PT Next Visit Plan  pt. on 30-day hold from therapy    Consulted and Agree with Plan of Care  Patient       Patient will benefit from skilled therapeutic intervention in order to improve the following deficits and impairments:  Decreased range of motion, Impaired UE functional use, Decreased activity tolerance, Pain, Improper body mechanics, Impaired flexibility, Decreased strength, Postural  dysfunction  Visit Diagnosis: Acute pain of right shoulder  Stiffness of right shoulder, not elsewhere classified  Muscle weakness (generalized)  Other symptoms and signs involving the musculoskeletal system     Problem List Patient Active Problem List   Diagnosis Date Noted  . Injury of right rotator cuff 09/21/2018  . Acute pain of right shoulder 09/21/2018  . Type 2 diabetes mellitus without complication, without long-term current use of insulin ([place] 12/14/2017  . Angina pectoris ([place] 12/14/2017  . Abnormal CT scan, stomach   . Low back pain 12/06/2014  . Obstructive sleep apnea 11/06/2014  . Metabolic syndrome 040/98/1191 . HTN (hypertension) 04/18/2013  . Hyperlipidemia 04/18/2013  . GERD (gastroesophageal reflux disease) 04/18/2013  . Morbid obesity ([place] 04/18/2013    MBess Harvest PTA 11/17/18 2:15 PM   [facility] [address] [place] [address]Phone: [phone]  Fax:  [phone] Name: Jimmy Perez MRN: [id number] of Birth: 107-23-1967 PHYSICAL THERAPY DISCHARGE SUMMARY  Visits from Start of Care: 6  Current functional level related to goals / functional outcomes: See above clinical impression; patient pleased with progress and did not return after being placed on 30 day hold   Remaining deficits: Decreased shoulder strength and ROM   Education / Equipment: HEP  Plan: Patient agrees to  discharge.  Patient goals were partially met. Patient is being discharged due to being pleased with the current functional level.  ?????     Janene Harvey, PT, DPT 12/23/18 2:39 PM

## 2018-11-24 ENCOUNTER — Encounter: Payer: No Typology Code available for payment source | Admitting: Physical Therapy

## 2018-11-25 ENCOUNTER — Other Ambulatory Visit: Payer: Self-pay

## 2018-11-25 MED ORDER — METFORMIN HCL 500 MG PO TABS: ORAL_TABLET | ORAL | 0 refills | Status: DC

## 2018-11-25 MED FILL — metFORMIN HCL 500 MG TABS: 500 | 90 days supply | Qty: 90 | Fill #0

## 2018-11-26 MED FILL — LOSARTAN POTASSIUM 50 MG TA: 50 | 30 days supply | Qty: 30 | Fill #1

## 2018-11-26 MED FILL — VASCEPA 1 GM CAPSULE: 1 | 90 days supply | Qty: 360 | Fill #2

## 2018-11-26 MED FILL — VIT D2 1.25 MG (50,000 UNIT: 1.25 MG | 84 days supply | Qty: 12 | Fill #2

## 2018-12-17 MED FILL — ROSUVASTATIN CALCIUM 40 MG: 40 | 90 days supply | Qty: 90 | Fill #3

## 2018-12-17 MED FILL — METOPROLOL SUCCINATE ER 25: 25 | 90 days supply | Qty: 90 | Fill #2

## 2018-12-29 ENCOUNTER — Ambulatory Visit: Payer: No Typology Code available for payment source | Admitting: Family Medicine

## 2018-12-31 MED FILL — LOSARTAN POTASSIUM 50 MG TA: 50 | 60 days supply | Qty: 60 | Fill #0

## 2019-01-19 MED FILL — AMLODIPINE BESYLATE 5 MG TA: 5 | 90 days supply | Qty: 90 | Fill #1

## 2019-01-19 MED FILL — PANTOPRAZOLE SOD DR 40 MG T: 40 | 90 days supply | Qty: 90 | Fill #1

## 2019-01-27 ENCOUNTER — Ambulatory Visit: Payer: 59 | Admitting: Internal Medicine

## 2019-02-07 ENCOUNTER — Ambulatory Visit (INDEPENDENT_AMBULATORY_CARE_PROVIDER_SITE_OTHER): Payer: No Typology Code available for payment source | Admitting: Family Medicine

## 2019-02-07 ENCOUNTER — Other Ambulatory Visit: Payer: Self-pay

## 2019-02-07 ENCOUNTER — Encounter: Payer: Self-pay | Admitting: Family Medicine

## 2019-02-07 DIAGNOSIS — W57XXXA Bitten or stung by nonvenomous insect and other nonvenomous arthropods, initial encounter: Secondary | ICD-10-CM

## 2019-02-07 DIAGNOSIS — S20362A Insect bite (nonvenomous) of left front wall of thorax, initial encounter: Secondary | ICD-10-CM

## 2019-02-07 MED ORDER — DOXYCYCLINE HYCLATE 100 MG PO TABS: 100.0000 mg | ORAL_TABLET | Freq: Two times a day (BID) | ORAL | 0 refills | Status: DC

## 2019-02-07 MED FILL — DOXYCYCLINE HYCLATE 100 MG: 100 | 14 days supply | Qty: 28 | Fill #0

## 2019-02-07 NOTE — Progress Notes (Signed)
Virtual Visit via telephone Note  I connected with Jimmy Perez. on 02/07/19 at 1126 by telephone and verified that I am speaking with the correct person using two identifiers. Jimmy Perez. is currently located at running errands and wife are currently with her during visit. The provider, Fransisca Kaufmann Dettinger, MD is located in their office at time of visit.  Call ended at 1133  I discussed the limitations, risks, security and privacy concerns of performing an evaluation and management service by telephone and the availability of in person appointments. I also discussed with the patient that there may be a patient responsible charge related to this service. The patient expressed understanding and agreed to proceed.   History and Present Illness: Tick bite on left upper chest, that they noticed just yesterday.  The rashes on his left upper chest and is just one small hard lump right where the bite was but has not spread any further.  He denies any fevers or chills or redness or warmth or rash anywhere else.  He has had tick bites before and has been treated with doxycycline without any issues.  No diagnosis found.  Outpatient Encounter Medications as of 02/07/2019  Medication Sig  . amLODipine (NORVASC) 5 MG tablet TAKE 1 TABLET BY MOUTH ONCE DAILY  . aspirin EC 81 MG tablet Take 81 mg by mouth daily.  . Blood Glucose Monitoring Suppl (FREESTYLE FREEDOM LITE) w/Device KIT Check BS once daily  . busPIRone (BUSPAR) 15 MG tablet Take 1 tablet (15 mg total) by mouth 2 (two) times daily as needed.  . clopidogrel (PLAVIX) 75 MG tablet Take 1 tablet (75 mg total) by mouth daily.  . colchicine 0.6 MG tablet Take 1 tablet (0.6 mg total) by mouth daily. May repeat in 2 hours x1 in 24hours (Patient taking differently: Take 0.6 mg by mouth daily as needed (for gout). May repeat in 2 hours x1 in 24hours)  . cyclobenzaprine (FLEXERIL) 10 MG tablet Take 1 tablet (10 mg total) by mouth 3  (three) times daily as needed for muscle spasms.  Marland Kitchen glucose blood (FREESTYLE TEST STRIPS) test strip E11.9  Test once daily  . Icosapent Ethyl (VASCEPA) 1 g CAPS Take 2 capsules (2 g total) by mouth 2 (two) times daily.  . Lancets (FREESTYLE) lancets Stick once daily for glucose  . losartan (COZAAR) 50 MG tablet Take 1 tablet (50 mg total) by mouth daily.  . metFORMIN (GLUCOPHAGE) 500 MG tablet TAKE 1 TABLET BY MOUTH ONCE DAILY WITH BREAKFAST  . metoprolol succinate (TOPROL-XL) 25 MG 24 hr tablet Take 1 tablet (25 mg total) by mouth daily.  . nitroGLYCERIN (NITROSTAT) 0.4 MG SL tablet Place 1 tablet (0.4 mg total) under the tongue every 5 (five) minutes as needed for chest pain.  . pantoprazole (PROTONIX) 40 MG tablet Take 1 tablet (40 mg total) by mouth 2 (two) times daily.  . rosuvastatin (CRESTOR) 40 MG tablet Take 1 tablet (40 mg total) by mouth at bedtime.  . Vitamin D, Ergocalciferol, (DRISDOL) 50000 units CAPS capsule Take 1 capsule (50,000 Units total) by mouth every 7 (seven) days.   No facility-administered encounter medications on file as of 02/07/2019.     Review of Systems  Constitutional: Negative for chills and fever.  Eyes: Negative for visual disturbance.  Respiratory: Negative for shortness of breath and wheezing.   Cardiovascular: Negative for chest pain and leg swelling.  Musculoskeletal: Negative for back pain, gait problem and myalgias.  Skin:  Positive for rash. Negative for wound.  All other systems reviewed and are negative.   Observations/Objective: Patient sounds comfortable and in no acute distress  Assessment and Plan: Problem List Items Addressed This Visit    None    Visit Diagnoses    Tick bite, initial encounter    -  Primary   Relevant Medications   doxycycline (VIBRA-TABS) 100 MG tablet       Follow Up Instructions: Follow-up as needed or if worsens, gave warning signs for Lyme disease or recommended spotted fever.    I discussed the  assessment and treatment plan with the patient. The patient was provided an opportunity to ask questions and all were answered. The patient agreed with the plan and demonstrated an understanding of the instructions.   The patient was advised to call back or seek an in-person evaluation if the symptoms worsen or if the condition fails to improve as anticipated.  The above assessment and management plan was discussed with the patient. The patient verbalized understanding of and has agreed to the management plan. Patient is aware to call the clinic if symptoms persist or worsen. Patient is aware when to return to the clinic for a follow-up visit. Patient educated on when it is appropriate to go to the emergency department.    I provided 7 minutes of non-face-to-face time during this encounter.    Worthy Rancher, MD

## 2019-02-15 MED FILL — VIT D2 1.25 MG (50,000 UNIT: 1.25 MG | 84 days supply | Qty: 12 | Fill #3

## 2019-02-15 MED FILL — metFORMIN HCL 500 MG TABS: 500 | 90 days supply | Qty: 90 | Fill #0

## 2019-02-15 MED FILL — busPIRone HCL 15 MG TABS: 15 | 90 days supply | Qty: 180 | Fill #1

## 2019-02-15 MED FILL — CLOPIDOGREL 75 MG TABLET: 75 | 90 days supply | Qty: 90 | Fill #0

## 2019-03-03 ENCOUNTER — Other Ambulatory Visit: Payer: Self-pay | Admitting: Family Medicine

## 2019-03-03 DIAGNOSIS — W57XXXA Bitten or stung by nonvenomous insect and other nonvenomous arthropods, initial encounter: Secondary | ICD-10-CM

## 2019-03-03 MED ORDER — DOXYCYCLINE HYCLATE 100 MG PO TABS: 100.0000 mg | ORAL_TABLET | Freq: Two times a day (BID) | ORAL | 0 refills | Status: DC

## 2019-03-03 MED FILL — DOXYCYCLINE HYCLATE 100 MG: 100 | 14 days supply | Qty: 28 | Fill #0

## 2019-03-09 ENCOUNTER — Other Ambulatory Visit: Payer: Self-pay | Admitting: Cardiovascular Disease

## 2019-03-09 MED FILL — VASCEPA 1 GM CAPSULE: 1 | 90 days supply | Qty: 360 | Fill #0

## 2019-03-09 MED FILL — LOSARTAN POTASSIUM 50 MG TA: 50 | 30 days supply | Qty: 30 | Fill #0

## 2019-03-10 ENCOUNTER — Telehealth: Payer: No Typology Code available for payment source | Admitting: Cardiovascular Disease

## 2019-04-04 ENCOUNTER — Other Ambulatory Visit: Payer: Self-pay | Admitting: Cardiovascular Disease

## 2019-04-04 ENCOUNTER — Other Ambulatory Visit: Payer: Self-pay | Admitting: *Deleted

## 2019-04-04 MED ORDER — ROSUVASTATIN CALCIUM 40 MG PO TABS: 40.0000 mg | ORAL_TABLET | Freq: Every day | ORAL | 3 refills | Status: DC

## 2019-04-04 MED FILL — LOSARTAN POTASSIUM 50 MG TA: 50 | 30 days supply | Qty: 30 | Fill #1

## 2019-04-04 MED FILL — ROSUVASTATIN CALCIUM 40 MG: 40 | 90 days supply | Qty: 90 | Fill #0

## 2019-04-13 MED FILL — ROSUVASTATIN CALCIUM 40 MG: 40 | 90 days supply | Qty: 90 | Fill #0

## 2019-04-13 MED FILL — AMLODIPINE BESYLATE 5 MG TA: 5 | 90 days supply | Qty: 90 | Fill #0

## 2019-04-13 MED FILL — METOPROLOL SUCCINATE ER 25: 25 | 90 days supply | Qty: 90 | Fill #0

## 2019-05-02 MED FILL — LOSARTAN POTASSIUM 50 MG TA: 50 | 30 days supply | Qty: 30 | Fill #2

## 2019-05-16 ENCOUNTER — Telehealth: Payer: Self-pay | Admitting: Cardiovascular Disease

## 2019-05-16 MED FILL — metFORMIN HCL 500 MG TABS: 500 | 90 days supply | Qty: 90 | Fill #0

## 2019-05-16 MED FILL — CLOPIDOGREL 75 MG TABLET: 75 | 90 days supply | Qty: 90 | Fill #0

## 2019-05-16 MED FILL — PANTOPRAZOLE SOD DR 40 MG T: 40 | 90 days supply | Qty: 90 | Fill #0

## 2019-05-16 NOTE — Telephone Encounter (Signed)
LMTCB to change appointment with Dr. Claiborne Billings on 7/22 to regular office visit.

## 2019-05-18 ENCOUNTER — Telehealth: Payer: Self-pay | Admitting: Cardiovascular Disease

## 2019-05-18 ENCOUNTER — Encounter: Payer: No Typology Code available for payment source | Admitting: Cardiovascular Disease

## 2019-05-18 ENCOUNTER — Encounter: Payer: Self-pay | Admitting: Cardiovascular Disease

## 2019-05-18 NOTE — Telephone Encounter (Signed)
I left a message for pt to let us know if he would do an Office Visit instead of a Virtual Visit.

## 2019-05-19 ENCOUNTER — Telehealth: Payer: Self-pay | Admitting: *Deleted

## 2019-05-19 NOTE — Telephone Encounter (Signed)
Left message for patient to call and schedule appointment (late pm) with Dr. Claiborne Billings

## 2019-05-20 NOTE — Progress Notes (Signed)
This encounter was created in error - please disregard.

## 2019-05-31 ENCOUNTER — Encounter: Payer: Self-pay | Admitting: Internal Medicine

## 2019-06-01 MED FILL — LOSARTAN POTASSIUM 50 MG TA: 50 | 30 days supply | Qty: 30 | Fill #3

## 2019-06-02 ENCOUNTER — Encounter: Payer: Self-pay | Admitting: Internal Medicine

## 2019-06-02 ENCOUNTER — Ambulatory Visit: Payer: No Typology Code available for payment source | Admitting: Internal Medicine

## 2019-06-02 ENCOUNTER — Other Ambulatory Visit: Payer: Self-pay

## 2019-06-02 VITALS — BP 150/90 | HR 74 | Temp 98.4°F | Ht 68.0 in | Wt 225.0 lb

## 2019-06-02 DIAGNOSIS — I10 Essential (primary) hypertension: Secondary | ICD-10-CM

## 2019-06-02 DIAGNOSIS — G4733 Obstructive sleep apnea (adult) (pediatric): Secondary | ICD-10-CM

## 2019-06-02 NOTE — Patient Instructions (Signed)
Order- DME [employer] Please change auto range to 10-20, mask of choice, humidifier, supplies, AirView/ card  Please call if we can help

## 2019-06-02 NOTE — Progress Notes (Signed)
HPI male nonsmoker followed for OSA, complicated by HBP, GERD, obesity, CAD/ stent Unattended home sleep study 01/05/15 AHI 77/ hr, desat to 73%, weight 253 lbs   -----------------------------------------------------------------------------------------.  01/21/18- 53 year old male nonsmoker followed for OSA, complicated by HBP, GERD, obesity, CAD/ stent CPAP auto 8-15/[facility]>> today auto 8-20 ----OSA: DME:[facility]. Pt wears CPAP nightly and DL attached. Pressure works well for patient.  No new supplies needed at this time.  Has had a stent placed for coronary disease without MI. He would like to try a little higher CPAP pressure which we discussed. Download 90% compliance AHI 1.8/hour.  He sleeps better with CPAP.  06/02/2019- 53 year old male nonsmoker followed for OSA, complicated by HBP, GERD, obesity, CAD/ stent CPAP auto 8-20/ [facility]>> today change to auto 10-20 Download compliance 90%, AHI 1.2/hr Body weight today 225 lbs -----F/U for OSA on CPAP 8-20, DME: [facility], no complaints Wants pressure to start a little higher when he puts on mask.  Download reviewed.  Brother recently died met cancer. Denies acute medical issues. Arrival BP 150/90 - for attention of PCP.  ROS-see HPI   + = positive Constitutional:   No-   weight loss, night sweats, fevers, chills, +fatigue, lassitude. HEENT:   No-  headaches, difficulty swallowing, tooth/dental problems, sore throat,       No-  sneezing, itching, ear ache, nasal congestion, post nasal drip,  CV:  No-   chest pain, orthopnea, PND, swelling in lower extremities, anasarca,                                                    dizziness, palpitations Resp: No-   shortness of breath with exertion or at rest.              No-   productive cough,  No non-productive cough,  No- coughing up of blood.              No-   change in color of mucus.  No- wheezing.   Skin: No-   rash or lesions. GI:  +heartburn, indigestion, No-abdominal pain, nausea, vomiting,   GU:  MS:  No-   joint pain or swelling.   Neuro-     nothing unusual Psych:  No- change in mood or affect. No depression or anxiety.  No memory loss.  OBJ- Physical Exam     +obese General- Alert, Oriented, Affect-appropriate, Distress- none acute,  Skin- rash-none, lesions- none, excoriation- none Lymphadenopathy- none Head- atraumatic            Eyes- Gross vision intact, PERRLA, conjunctivae and secretions clear            Ears- Hearing, canals-normal            Nose- Clear, no-Septal dev, mucus, polyps, erosion, perforation             Throat- Mallampati IV , mucosa clear , drainage- none, tonsils- atrophic, + hoarse Neck- flexible , trachea midline, no stridor , thyroid nl, carotid no bruit Chest - symmetrical excursion , unlabored           Heart/CV- RRR , no murmur , no gallop  , no rub, nl s1 s2                           - JVD-  none , edema- none, stasis changes- none, varices- none           Lung- clear to P&A, wheeze- none, cough- none , dullness-none, rub- none           Chest wall-  Abd-  Br/ Gen/ Rectal- Not done, not indicated Extrem- cyanosis- none, clubbing, none, atrophy- none, strength- nl Neuro- grossly intact to observation

## 2019-07-01 MED FILL — LOSARTAN POTASSIUM 50 MG TA: 50 | 30 days supply | Qty: 30 | Fill #4

## 2019-07-14 MED FILL — ROSUVASTATIN CALCIUM 40 MG: 40 | 90 days supply | Qty: 90 | Fill #1

## 2019-07-15 ENCOUNTER — Ambulatory Visit (INDEPENDENT_AMBULATORY_CARE_PROVIDER_SITE_OTHER): Payer: No Typology Code available for payment source | Admitting: Family Medicine

## 2019-07-15 ENCOUNTER — Encounter: Payer: Self-pay | Admitting: Family Medicine

## 2019-07-15 DIAGNOSIS — M109 Gout, unspecified: Secondary | ICD-10-CM | POA: Diagnosis not present

## 2019-07-15 MED ORDER — COLCHICINE 0.6 MG PO TABS: ORAL_TABLET | ORAL | 2 refills | Status: DC

## 2019-07-15 MED FILL — COLCHICINE 0.6 MG TABS: 0.6 | 9 days supply | Qty: 9 | Fill #0

## 2019-07-15 NOTE — Progress Notes (Signed)
Virtual Visit via Telephone Note  I connected with Jimmy Perez. on 07/15/19 at 1:52 PM by telephone and verified that I am speaking with the correct person using two identifiers. Jimmy Perez. is currently located at work and nobody is currently with him during this visit. The provider, Loman Brooklyn, FNP is located in their home at time of visit.  I discussed the limitations, risks, security and privacy concerns of performing an evaluation and management service by telephone and the availability of in person appointments. I also discussed with the patient that there may be a patient responsible charge related to this service. The patient expressed understanding and agreed to proceed.  Subjective: PCP: Dettinger, Fransisca Kaufmann, MD  Chief Complaint  Patient presents with  . Gout   Patient currently experiencing gout flare of left great toe that began yesterday. He reports the toe is tight, sore to the touch, and aching. No erythema as of yet. He reports his last gout flare was in 2018 and felt the exact same. He took something for pain last night but does not have any colchicine at home.    ROS: Per HPI  Current Outpatient Medications:  .  amLODipine (NORVASC) 5 MG tablet, TAKE 1 TABLET BY MOUTH ONCE DAILY, Disp: 90 tablet, Rfl: 3 .  aspirin EC 81 MG tablet, Take 81 mg by mouth daily., Disp: , Rfl:  .  Blood Glucose Monitoring Suppl (FREESTYLE FREEDOM LITE) w/Device KIT, Check BS once daily, Disp: 1 each, Rfl: 0 .  busPIRone (BUSPAR) 15 MG tablet, Take 1 tablet (15 mg total) by mouth 2 (two) times daily as needed., Disp: 180 tablet, Rfl: 3 .  clopidogrel (PLAVIX) 75 MG tablet, Take 1 tablet (75 mg total) by mouth daily., Disp: 90 tablet, Rfl: 3 .  colchicine 0.6 MG tablet, Take 1 tablet (0.6 mg total) by mouth daily. May repeat in 2 hours x1 in 24hours (Patient taking differently: Take 0.6 mg by mouth daily as needed (for gout). May repeat in 2 hours x1 in 24hours), Disp: 20  tablet, Rfl: 1 .  cyclobenzaprine (FLEXERIL) 10 MG tablet, Take 1 tablet (10 mg total) by mouth 3 (three) times daily as needed for muscle spasms., Disp: 30 tablet, Rfl: 1 .  glucose blood (FREESTYLE TEST STRIPS) test strip, E11.9  Test once daily, Disp: 100 each, Rfl: 12 .  Lancets (FREESTYLE) lancets, Stick once daily for glucose, Disp: 100 each, Rfl: 12 .  losartan (COZAAR) 50 MG tablet, Take 1 tablet (50 mg total) by mouth daily., Disp: 90 tablet, Rfl: 3 .  metFORMIN (GLUCOPHAGE) 500 MG tablet, TAKE 1 TABLET BY MOUTH ONCE DAILY WITH BREAKFAST, Disp: 90 tablet, Rfl: 0 .  metoprolol succinate (TOPROL-XL) 25 MG 24 hr tablet, Take 1 tablet (25 mg total) by mouth daily. (Patient not taking: Reported on 06/02/2019), Disp: 90 tablet, Rfl: 3 .  nitroGLYCERIN (NITROSTAT) 0.4 MG SL tablet, Place 1 tablet (0.4 mg total) under the tongue every 5 (five) minutes as needed for chest pain., Disp: 25 tablet, Rfl: 1 .  pantoprazole (PROTONIX) 40 MG tablet, Take 1 tablet (40 mg total) by mouth 2 (two) times daily. (Patient not taking: Reported on 06/02/2019), Disp: 180 tablet, Rfl: 3 .  rosuvastatin (CRESTOR) 40 MG tablet, TAKE 1 TABLET BY MOUTH EVERY NIGHT AT BEDTIME, Disp: 90 tablet, Rfl: 0 .  VASCEPA 1 g CAPS, TAKE 2 CAPSULES BY MOUTH TWO TIMES DAILY, Disp: 360 capsule, Rfl: 0 .  Vitamin D,  Ergocalciferol, (DRISDOL) 50000 units CAPS capsule, Take 1 capsule (50,000 Units total) by mouth every 7 (seven) days., Disp: 12 capsule, Rfl: 3  No Known Allergies Past Medical History:  Diagnosis Date  . Borderline diabetes    no meds, watching diet.  . Chronic kidney disease   . Diabetes mellitus without complication ([place])   . GERD (gastroesophageal reflux disease)   . Hyperlipidemia   . Hypertension   . Kidney stones   . OSA (obstructive sleep apnea)    Cpap use--study 12-26-14 Epic suggestive of this.  . Pre-diabetes    "per patient"  . Vitamin D deficiency     Observations/Objective: A&O  No respiratory  distress or wheezing audible over the phone Mood, judgement, and thought processes all WNL  Assessment and Plan: 1. Acute gout involving toe of left foot, unspecified cause - Education provided on gout.  - colchicine 0.6 MG tablet; Take 1.2 mg (2 tablets) by mouth once, then take 0.6 mg one hour later. Use as needed for gout flare. Do not repeat for at least 3 days.  Dispense: 9 tablet; Refill: 2   Follow Up Instructions:  I discussed the assessment and treatment plan with the patient. The patient was provided an opportunity to ask questions and all were answered. The patient agreed with the plan and demonstrated an understanding of the instructions.   The patient was advised to call back or seek an in-person evaluation if the symptoms worsen or if the condition fails to improve as anticipated.  The above assessment and management plan was discussed with the patient. The patient verbalized understanding of and has agreed to the management plan. Patient is aware to call the clinic if symptoms persist or worsen. Patient is aware when to return to the clinic for a follow-up visit. Patient educated on when it is appropriate to go to the emergency department.   Time call ended: 2:00 PM  I provided 9 minutes of non-face-to-face time during this encounter.  Hendricks Limes, MSN, APRN, FNP-C [facility] 07/15/19

## 2019-07-15 NOTE — Patient Instructions (Signed)
Gout  Gout is painful swelling of your joints. Gout is a type of arthritis. It is caused by having too much uric acid in your body. Uric acid is a chemical that is made when your body breaks down substances called purines. If your body has too much uric acid, sharp crystals can form and build up in your joints. This causes pain and swelling. Gout attacks can happen quickly and be very painful (acute gout). Over time, the attacks can affect more joints and happen more often (chronic gout). What are the causes?  Too much uric acid in your blood. This can happen because: ? Your kidneys do not remove enough uric acid from your blood. ? Your body makes too much uric acid. ? You eat too many foods that are high in purines. These foods include organ meats, some seafood, and beer.  Trauma or stress. What increases the risk?  Having a family history of gout.  Being male and middle-aged.  Being male and having gone through menopause.  Being very overweight (obese).  Drinking alcohol, especially beer.  Not having enough water in the body (being dehydrated).  Losing weight too quickly.  Having an organ transplant.  Having lead poisoning.  Taking certain medicines.  Having kidney disease.  Having a skin condition called psoriasis. What are the signs or symptoms? An attack of acute gout usually happens in just one joint. The most common place is the big toe. Attacks often start at night. Other joints that may be affected include joints of the feet, ankle, knee, fingers, wrist, or elbow. Symptoms of an attack may include:  Very bad pain.  Warmth.  Swelling.  Stiffness.  Shiny, red, or purple skin.  Tenderness. The affected joint may be very painful to touch.  Chills and fever. Chronic gout may cause symptoms more often. More joints may be involved. You may also have white or yellow lumps (tophi) on your hands or feet or in other areas near your joints. How is this  treated?  Treatment for this condition has two phases: treating an acute attack and preventing future attacks.  Acute gout treatment may include: ? NSAIDs. ? Steroids. These are taken by mouth or injected into a joint. ? Colchicine. This medicine relieves pain and swelling. It can be given by mouth or through an IV tube.  Preventive treatment may include: ? Taking small doses of NSAIDs or colchicine daily. ? Using a medicine that reduces uric acid levels in your blood. ? Making changes to your diet. You may need to see a food expert (dietitian) about what to eat and drink to prevent gout. Follow these instructions at home: During a gout attack   If told, put ice on the painful area: ? Put ice in a plastic bag. ? Place a towel between your skin and the bag. ? Leave the ice on for 20 minutes, 2-3 times a day.  Raise (elevate) the painful joint above the level of your heart as often as you can.  Rest the joint as much as possible. If the joint is in your leg, you may be given crutches.  Follow instructions from your doctor about what you cannot eat or drink. Avoiding future gout attacks  Eat a low-purine diet. Avoid foods and drinks such as: ? Liver. ? Kidney. ? Anchovies. ? Asparagus. ? Herring. ? Mushrooms. ? Mussels. ? Beer.  Stay at a healthy weight. If you want to lose weight, talk with your doctor. Do not lose weight   too fast.  Start or continue an exercise plan as told by your doctor. Eating and drinking  Drink enough fluids to keep your pee (urine) pale yellow.  If you drink alcohol: ? Limit how much you use to:  0-1 drink a day for women.  0-2 drinks a day for men. ? Be aware of how much alcohol is in your drink. In the [place] one drink equals one 12 oz bottle of beer (355 mL), one 5 oz glass of wine (148 mL), or one 1 oz glass of hard liquor (44 mL). General instructions  Take over-the-counter and prescription medicines only as told by your doctor.  Do  not drive or use heavy machinery while taking prescription pain medicine.  Return to your normal activities as told by your doctor. Ask your doctor what activities are safe for you.  Keep all follow-up visits as told by your doctor. This is important. Contact a doctor if:  You have another gout attack.  You still have symptoms of a gout attack after 10 days of treatment.  You have problems (side effects) because of your medicines.  You have chills or a fever.  You have burning pain when you pee (urinate).  You have pain in your lower back or belly. Get help right away if:  You have very bad pain.  Your pain cannot be controlled.  You cannot pee. Summary  Gout is painful swelling of the joints.  The most common site of pain is the big toe, but it can affect other joints.  Medicines and avoiding some foods can help to prevent and treat gout attacks. This information is not intended to replace advice given to you by your health care provider. Make sure you discuss any questions you have with your health care provider. Document Released: 07/22/2008 Document Revised: 05/05/2018 Document Reviewed: 05/05/2018 Elsevier Patient Education  2020 [employer] Inc.  

## 2019-07-17 NOTE — Assessment & Plan Note (Signed)
Benefits with good compliance and control. Plan- continue CPAP, changing range to auto 10-20.

## 2019-07-17 NOTE — Assessment & Plan Note (Signed)
Arrival BP 150/ 90.  Plan- he will review with PCP.

## 2019-07-19 ENCOUNTER — Ambulatory Visit (INDEPENDENT_AMBULATORY_CARE_PROVIDER_SITE_OTHER): Payer: No Typology Code available for payment source

## 2019-07-19 ENCOUNTER — Ambulatory Visit (INDEPENDENT_AMBULATORY_CARE_PROVIDER_SITE_OTHER): Payer: No Typology Code available for payment source | Admitting: Physician Assistant

## 2019-07-19 ENCOUNTER — Encounter: Payer: Self-pay | Admitting: Physician Assistant

## 2019-07-19 ENCOUNTER — Other Ambulatory Visit: Payer: Self-pay

## 2019-07-19 VITALS — BP 128/83 | HR 73 | Temp 99.3°F | Ht 68.0 in | Wt 233.0 lb

## 2019-07-19 DIAGNOSIS — M25561 Pain in right knee: Secondary | ICD-10-CM | POA: Diagnosis not present

## 2019-07-19 DIAGNOSIS — S83411A Sprain of medial collateral ligament of right knee, initial encounter: Secondary | ICD-10-CM

## 2019-07-20 ENCOUNTER — Encounter: Payer: Self-pay | Admitting: *Deleted

## 2019-07-21 NOTE — Progress Notes (Signed)
BP 128/83   Pulse 73   Temp 99.3 F (37.4 C) (Temporal)   Ht '5\' 8"'  (1.727 m)   Wt 233 lb (105.7 kg)   SpO2 97%   BMI 35.43 kg/m    Subjective:    Patient ID: Jimmy Perez., male    DOB: 01/25/1966, 53 y.o.   MRN: [id number]  HPI: Jimmy Perez. is a 53 y.o. male presenting on 07/19/2019 for Knee Pain  Patient has had 2 days of increasing knee pain.  He states that he was going up and down ladders over the weekend which is not something he normally does.  And the pain has been increasing over the last 2 days.  He did use some ice last night and it has improved a little bit but still very tender in the medial portion of the right knee  Past Medical History:  Diagnosis Date  . Borderline diabetes    no meds, watching diet.  . Chronic kidney disease   . Diabetes mellitus without complication ([place])   . GERD (gastroesophageal reflux disease)   . Hyperlipidemia   . Hypertension   . Kidney stones   . OSA (obstructive sleep apnea)    Cpap use--study 12-26-14 Epic suggestive of this.  . Pre-diabetes    "per patient"  . Vitamin D deficiency    Relevant past medical, surgical, family and social history reviewed and updated as indicated. Interim medical history since our last visit reviewed. Allergies and medications reviewed and updated. DATA REVIEWED: CHART IN EPIC  Family History reviewed for pertinent findings.  Review of Systems  Constitutional: Negative.  Negative for appetite change and fatigue.  Eyes: Negative for pain and visual disturbance.  Respiratory: Negative.  Negative for cough, chest tightness, shortness of breath and wheezing.   Cardiovascular: Negative.  Negative for chest pain, palpitations and leg swelling.  Gastrointestinal: Negative.  Negative for abdominal pain, diarrhea, nausea and vomiting.  Genitourinary: Negative.   Musculoskeletal: Positive for arthralgias and joint swelling.  Skin: Negative.  Negative for color change and rash.   Neurological: Negative.  Negative for weakness, numbness and headaches.  Psychiatric/Behavioral: Negative.     Allergies as of 07/19/2019   No Known Allergies     Medication List       Accurate as of July 19, 2019 11:59 PM. If you have any questions, ask your nurse or doctor.        amLODipine 5 MG tablet Commonly known as: NORVASC TAKE 1 TABLET BY MOUTH ONCE DAILY   aspirin EC 81 MG tablet Take 81 mg by mouth daily.   busPIRone 15 MG tablet Commonly known as: BUSPAR Take 1 tablet (15 mg total) by mouth 2 (two) times daily as needed.   clopidogrel 75 MG tablet Commonly known as: Plavix Take 1 tablet (75 mg total) by mouth daily.   colchicine 0.6 MG tablet Take 1.2 mg (2 tablets) by mouth once, then take 0.6 mg one hour later. Use as needed for gout flare. Do not repeat for at least 3 days.   cyclobenzaprine 10 MG tablet Commonly known as: FLEXERIL Take 1 tablet (10 mg total) by mouth 3 (three) times daily as needed for muscle spasms.   FreeStyle Freedom Lite w/Device Kit Check BS once daily   freestyle lancets Stick once daily for glucose   glucose blood test strip Commonly known as: FREESTYLE TEST STRIPS E11.9  Test once daily   losartan 50 MG tablet Commonly known as: COZAAR  Take 1 tablet (50 mg total) by mouth daily.   metFORMIN 500 MG tablet Commonly known as: GLUCOPHAGE TAKE 1 TABLET BY MOUTH ONCE DAILY WITH BREAKFAST   metoprolol succinate 25 MG 24 hr tablet Commonly known as: TOPROL-XL Take 1 tablet (25 mg total) by mouth daily.   nitroGLYCERIN 0.4 MG SL tablet Commonly known as: NITROSTAT Place 1 tablet (0.4 mg total) under the tongue every 5 (five) minutes as needed for chest pain.   pantoprazole 40 MG tablet Commonly known as: Protonix Take 1 tablet (40 mg total) by mouth 2 (two) times daily.   rosuvastatin 40 MG tablet Commonly known as: CRESTOR TAKE 1 TABLET BY MOUTH EVERY NIGHT AT BEDTIME   Vascepa 1 g Caps Generic drug:  Icosapent Ethyl TAKE 2 CAPSULES BY MOUTH TWO TIMES DAILY   Vitamin D (Ergocalciferol) 1.25 MG (50000 UT) Caps capsule Commonly known as: DRISDOL Take 1 capsule (50,000 Units total) by mouth every 7 (seven) days.          Objective:    BP 128/83   Pulse 73   Temp 99.3 F (37.4 C) (Temporal)   Ht '5\' 8"'  (1.727 m)   Wt 233 lb (105.7 kg)   SpO2 97%   BMI 35.43 kg/m   No Known Allergies  Wt Readings from Last 3 Encounters:  07/19/19 233 lb (105.7 kg)  06/02/19 225 lb (102.1 kg)  05/18/19 224 lb (101.6 kg)    Physical Exam Vitals signs and nursing note reviewed.  Constitutional:      General: He is not in acute distress.    Appearance: He is well-developed.  HENT:     Head: Normocephalic and atraumatic.  Eyes:     Conjunctiva/sclera: Conjunctivae normal.     Pupils: Pupils are equal, round, and reactive to light.  Cardiovascular:     Rate and Rhythm: Normal rate and regular rhythm.     Heart sounds: Normal heart sounds.  Pulmonary:     Effort: Pulmonary effort is normal. No respiratory distress.     Breath sounds: Normal breath sounds.  Musculoskeletal:     Right knee: He exhibits decreased range of motion and swelling. He exhibits no erythema. Tenderness found. Medial joint line tenderness noted.       Legs:  Skin:    General: Skin is warm and dry.  Psychiatric:        Behavior: Behavior normal.         Assessment & Plan:   1. Acute pain of right knee - DG Knee 1-2 Views Right; Future  2. Sprain of medial collateral ligament of right knee, initial encounter - DG Knee 1-2 Views Right; Future Ibuprofen 400 mg twice a day for the next 3 to 5 days Ice as much as possible Avoid climbing and going up and down ladders  Continue all other maintenance medications as listed above.  Follow up plan: No follow-ups on file.  Educational handout given for [place] PA-C [facility] [address]  [place], [place] [address]  [phone]   07/21/2019, 2:59 PM

## 2019-07-25 ENCOUNTER — Ambulatory Visit: Payer: No Typology Code available for payment source | Admitting: Cardiovascular Disease

## 2019-07-26 ENCOUNTER — Other Ambulatory Visit: Payer: Self-pay | Admitting: *Deleted

## 2019-07-26 MED ORDER — METOPROLOL SUCCINATE ER 25 MG PO TB24: 25.0000 mg | ORAL_TABLET | Freq: Every day | ORAL | 3 refills | Status: DC

## 2019-07-26 MED ORDER — VASCEPA 1 G PO CAPS: ORAL_CAPSULE | ORAL | 0 refills | Status: DC

## 2019-08-01 MED FILL — LOSARTAN POTASSIUM 50 MG TA: 50 | 30 days supply | Qty: 30 | Fill #5

## 2019-08-09 ENCOUNTER — Ambulatory Visit: Payer: No Typology Code available for payment source | Admitting: Physician Assistant

## 2019-08-09 ENCOUNTER — Ambulatory Visit (INDEPENDENT_AMBULATORY_CARE_PROVIDER_SITE_OTHER): Payer: No Typology Code available for payment source | Admitting: Family Medicine

## 2019-08-09 ENCOUNTER — Encounter: Payer: Self-pay | Admitting: Family Medicine

## 2019-08-09 DIAGNOSIS — K219 Gastro-esophageal reflux disease without esophagitis: Secondary | ICD-10-CM

## 2019-08-09 DIAGNOSIS — I10 Essential (primary) hypertension: Secondary | ICD-10-CM | POA: Diagnosis not present

## 2019-08-09 DIAGNOSIS — E1169 Type 2 diabetes mellitus with other specified complication: Secondary | ICD-10-CM

## 2019-08-09 DIAGNOSIS — E559 Vitamin D deficiency, unspecified: Secondary | ICD-10-CM

## 2019-08-09 DIAGNOSIS — E782 Mixed hyperlipidemia: Secondary | ICD-10-CM

## 2019-08-09 LAB — BAYER DCA HB A1C WAIVED: HB A1C (BAYER DCA - WAIVED): 6.5 % (ref <7.0)

## 2019-08-09 MED ORDER — LOSARTAN POTASSIUM 50 MG PO TABS: 50.0000 mg | ORAL_TABLET | Freq: Every day | ORAL | 3 refills | Status: DC

## 2019-08-09 MED ORDER — METOPROLOL SUCCINATE ER 25 MG PO TB24: 25.0000 mg | ORAL_TABLET | Freq: Every day | ORAL | 3 refills | Status: DC

## 2019-08-09 MED ORDER — METFORMIN HCL 500 MG PO TABS: 500.0000 mg | ORAL_TABLET | Freq: Every day | ORAL | 3 refills | Status: DC

## 2019-08-09 MED ORDER — CLOPIDOGREL BISULFATE 75 MG PO TABS: 75.0000 mg | ORAL_TABLET | Freq: Every day | ORAL | 3 refills | Status: DC

## 2019-08-09 MED ORDER — AMLODIPINE BESYLATE 5 MG PO TABS: 5.0000 mg | ORAL_TABLET | Freq: Every day | ORAL | 3 refills | Status: DC

## 2019-08-09 MED ORDER — VASCEPA 1 G PO CAPS: 2.0000 g | ORAL_CAPSULE | Freq: Two times a day (BID) | ORAL | 3 refills | Status: DC

## 2019-08-09 MED ORDER — PANTOPRAZOLE SODIUM 40 MG PO TBEC: 40.0000 mg | DELAYED_RELEASE_TABLET | Freq: Every day | ORAL | 3 refills | Status: DC

## 2019-08-09 MED ORDER — ROSUVASTATIN CALCIUM 40 MG PO TABS: 40.0000 mg | ORAL_TABLET | Freq: Every day | ORAL | 3 refills | Status: DC

## 2019-08-09 MED FILL — busPIRone HCL 15 MG TABS: 15 | 90 days supply | Qty: 180 | Fill #0

## 2019-08-09 MED FILL — PANTOPRAZOLE SOD DR 40 MG T: 40 | 90 days supply | Qty: 90 | Fill #1

## 2019-08-09 MED FILL — metFORMIN HCL 500 MG TABS: 500 | 90 days supply | Qty: 90 | Fill #0

## 2019-08-09 MED FILL — CLOPIDOGREL 75 MG TABLET: 75 | 90 days supply | Qty: 90 | Fill #0

## 2019-08-09 NOTE — Progress Notes (Signed)
Virtual Visit via telephone Note  I connected with Jimmy Perez. on 08/09/19 at 1615 by telephone and verified that I am speaking with the correct person using two identifiers. Jimmy Perez. is currently located at home and no other people are currently with her during visit. The provider, Fransisca Kaufmann Kiannah Grunow, MD is located in their office at time of visit.  Call ended at 1642  I discussed the limitations, risks, security and privacy concerns of performing an evaluation and management service by telephone and the availability of in person appointments. I also discussed with the patient that there may be a patient responsible charge related to this service. The patient expressed understanding and agreed to proceed.  [id number] History and Present Illness: Type 2 diabetes mellitus Patient comes in today for recheck of his diabetes. Patient has been currently taking metformin and BS is 120-131. Patient is currently on an ACE inhibitor/ARB. Patient has not seen an ophthalmologist this year. Patient denies any issues with their feet.   Patient is having pain is on the right inner knee that is improving and he still has a minor limp. It is improving  Hypertension Patient is currently on metoprolol and losartan and amlodipine, and their blood pressure today is 120/72. Patient denies any lightheadedness or dizziness. Patient denies headaches, blurred vision, chest pains, shortness of breath, or weakness. Denies any side effects from medication and is content with current medication.   Hyperlipidemia Patient is coming in for recheck of his hyperlipidemia. The patient is currently taking crestor and vascepa. They deny any issues with myalgias or history of liver damage from it. They deny any focal numbness or weakness or chest pain.   GERD Patient is currently on pantprazole.  She denies any major symptoms or abdominal pain or belching or burping. She denies any blood in her stool  or lightheadedness or dizziness.   No diagnosis found.  Outpatient Encounter Medications as of 08/09/2019  Medication Sig  . amLODipine (NORVASC) 5 MG tablet TAKE 1 TABLET BY MOUTH ONCE DAILY  . aspirin EC 81 MG tablet Take 81 mg by mouth daily.  . Blood Glucose Monitoring Suppl (FREESTYLE FREEDOM LITE) w/Device KIT Check BS once daily  . busPIRone (BUSPAR) 15 MG tablet Take 1 tablet (15 mg total) by mouth 2 (two) times daily as needed.  . clopidogrel (PLAVIX) 75 MG tablet Take 1 tablet (75 mg total) by mouth daily.  . colchicine 0.6 MG tablet Take 1.2 mg (2 tablets) by mouth once, then take 0.6 mg one hour later. Use as needed for gout flare. Do not repeat for at least 3 days.  . cyclobenzaprine (FLEXERIL) 10 MG tablet Take 1 tablet (10 mg total) by mouth 3 (three) times daily as needed for muscle spasms.  Marland Kitchen glucose blood (FREESTYLE TEST STRIPS) test strip E11.9  Test once daily  . Icosapent Ethyl (VASCEPA) 1 g CAPS TAKE 2 CAPSULES BY MOUTH TWO TIMES DAILY  . Lancets (FREESTYLE) lancets Stick once daily for glucose  . losartan (COZAAR) 50 MG tablet Take 1 tablet (50 mg total) by mouth daily.  . metFORMIN (GLUCOPHAGE) 500 MG tablet TAKE 1 TABLET BY MOUTH ONCE DAILY WITH BREAKFAST  . metoprolol succinate (TOPROL-XL) 25 MG 24 hr tablet Take 1 tablet (25 mg total) by mouth daily.  . nitroGLYCERIN (NITROSTAT) 0.4 MG SL tablet Place 1 tablet (0.4 mg total) under the tongue every 5 (five) minutes as needed for chest pain.  . pantoprazole (PROTONIX) 40 MG  tablet Take 1 tablet (40 mg total) by mouth 2 (two) times daily.  . rosuvastatin (CRESTOR) 40 MG tablet TAKE 1 TABLET BY MOUTH EVERY NIGHT AT BEDTIME  . Vitamin D, Ergocalciferol, (DRISDOL) 50000 units CAPS capsule Take 1 capsule (50,000 Units total) by mouth every 7 (seven) days.   No facility-administered encounter medications on file as of 08/09/2019.     Review of Systems  Constitutional: Negative for chills and fever.  HENT: Negative for  ear pain and tinnitus.   Eyes: Negative for pain and visual disturbance.  Respiratory: Negative for cough, shortness of breath and wheezing.   Cardiovascular: Negative for chest pain, palpitations and leg swelling.  Gastrointestinal: Negative for abdominal pain, blood in stool, constipation and diarrhea.  Genitourinary: Negative for dysuria and hematuria.  Musculoskeletal: Positive for arthralgias. Negative for back pain, gait problem and myalgias.  Skin: Negative for rash.  Neurological: Negative for dizziness, weakness, light-headedness and headaches.  Psychiatric/Behavioral: Negative for suicidal ideas.  All other systems reviewed and are negative.   Observations/Objective: Patient sounds comfortable and in no acute distress  Assessment and Plan: Problem List Items Addressed This Visit      Cardiovascular and Mediastinum   HTN (hypertension)   Relevant Medications   metoprolol succinate (TOPROL-XL) 25 MG 24 hr tablet   losartan (COZAAR) 50 MG tablet   amLODipine (NORVASC) 5 MG tablet   Icosapent Ethyl (VASCEPA) 1 g CAPS   rosuvastatin (CRESTOR) 40 MG tablet   Other Relevant Orders   CMP14+EGFR     Digestive   GERD (gastroesophageal reflux disease)   Relevant Medications   pantoprazole (PROTONIX) 40 MG tablet   Other Relevant Orders   CBC with Differential/Platelet     Endocrine   Type 2 diabetes mellitus with other specified complication (HCC)   Relevant Medications   losartan (COZAAR) 50 MG tablet   metFORMIN (GLUCOPHAGE) 500 MG tablet   rosuvastatin (CRESTOR) 40 MG tablet   Other Relevant Orders   CMP14+EGFR   Bayer DCA Hb A1c Waived     Other   Hyperlipidemia - Primary   Relevant Medications   metoprolol succinate (TOPROL-XL) 25 MG 24 hr tablet   losartan (COZAAR) 50 MG tablet   amLODipine (NORVASC) 5 MG tablet   Icosapent Ethyl (VASCEPA) 1 g CAPS   rosuvastatin (CRESTOR) 40 MG tablet   Other Relevant Orders   Lipid panel    Other Visit Diagnoses     Vitamin D deficiency       Relevant Orders   VITAMIN D 25 Hydroxy (Vit-D Deficiency, Fractures)       Follow Up Instructions: Follow up in 6 months  Patient needs an eye doctor appt Patient can pick up with voltaren gel for knee Will check vit d and evaluate if needed to continue    I discussed the assessment and treatment plan with the patient. The patient was provided an opportunity to ask questions and all were answered. The patient agreed with the plan and demonstrated an understanding of the instructions.   The patient was advised to call back or seek an in-person evaluation if the symptoms worsen or if the condition fails to improve as anticipated.  The above assessment and management plan was discussed with the patient. The patient verbalized understanding of and has agreed to the management plan. Patient is aware to call the clinic if symptoms persist or worsen. Patient is aware when to return to the clinic for a follow-up visit. Patient educated on when it is  appropriate to go to the emergency department.    I provided 27 minutes of non-face-to-face time during this encounter.    Worthy Rancher, MD

## 2019-08-09 NOTE — Patient Instructions (Addendum)
Patient needs an eye doctor appt Patient can pick up with voltaren gel for knee Will check vit d and evaluate if needed to continue Needs nurse visit for flu shot  Sent refills  F/u with cards

## 2019-08-10 LAB — VITAMIN D 25 HYDROXY (VIT D DEFICIENCY, FRACTURES): Vit D, 25-Hydroxy: 25.4 ng/mL — ABNORMAL LOW (ref 30.0–100.0)

## 2019-08-10 LAB — CBC WITH DIFFERENTIAL/PLATELET
Basophils Absolute: 0.1 10*3/uL (ref 0.0–0.2)
Basos: 1 %
EOS (ABSOLUTE): 0.3 10*3/uL (ref 0.0–0.4)
Eos: 5 %
Hematocrit: 40.9 % (ref 37.5–51.0)
Hemoglobin: 14.1 g/dL (ref 13.0–17.7)
Immature Grans (Abs): 0 10*3/uL (ref 0.0–0.1)
Immature Granulocytes: 0 %
Lymphocytes Absolute: 2.1 10*3/uL (ref 0.7–3.1)
Lymphs: 34 %
MCH: 32.1 pg (ref 26.6–33.0)
MCHC: 34.5 g/dL (ref 31.5–35.7)
MCV: 93 fL (ref 79–97)
Monocytes Absolute: 0.7 10*3/uL (ref 0.1–0.9)
Monocytes: 11 %
Neutrophils Absolute: 3.1 10*3/uL (ref 1.4–7.0)
Neutrophils: 49 %
Platelets: 258 10*3/uL (ref 150–450)
RBC: 4.39 x10E6/uL (ref 4.14–5.80)
RDW: 12.6 % (ref 11.6–15.4)
WBC: 6.3 10*3/uL (ref 3.4–10.8)

## 2019-08-10 LAB — CMP14+EGFR
ALT: 24 IU/L (ref 0–44)
AST: 16 IU/L (ref 0–40)
Albumin/Globulin Ratio: 2.4 — ABNORMAL HIGH (ref 1.2–2.2)
Albumin: 4.4 g/dL (ref 3.8–4.9)
Alkaline Phosphatase: 65 IU/L (ref 39–117)
BUN/Creatinine Ratio: 15 (ref 9–20)
BUN: 13 mg/dL (ref 6–24)
Bilirubin Total: 0.3 mg/dL (ref 0.0–1.2)
CO2: 20 mmol/L (ref 20–29)
Calcium: 8.8 mg/dL (ref 8.7–10.2)
Chloride: 106 mmol/L (ref 96–106)
Creatinine, Ser: 0.87 mg/dL (ref 0.76–1.27)
GFR calc Af Amer: 115 mL/min/{1.73_m2} (ref >59)
GFR calc non Af Amer: 99 mL/min/{1.73_m2} (ref >59)
Globulin, Total: 1.8 g/dL (ref 1.5–4.5)
Glucose: 118 mg/dL — ABNORMAL HIGH (ref 65–99)
Potassium: 4.6 mmol/L (ref 3.5–5.2)
Sodium: 142 mmol/L (ref 134–144)
Total Protein: 6.2 g/dL (ref 6.0–8.5)

## 2019-08-10 LAB — LIPID PANEL
Chol/HDL Ratio: 3.3 ratio (ref 0.0–5.0)
Cholesterol, Total: 131 mg/dL (ref 100–199)
HDL: 40 mg/dL (ref >39)
LDL Chol Calc (NIH): 49 mg/dL (ref 0–99)
Triglycerides: 274 mg/dL — ABNORMAL HIGH (ref 0–149)
VLDL Cholesterol Cal: 42 mg/dL — ABNORMAL HIGH (ref 5–40)

## 2019-08-11 MED FILL — LOSARTAN POTASSIUM 50 MG TA: 50 | 30 days supply | Qty: 30 | Fill #5

## 2019-08-15 ENCOUNTER — Encounter: Payer: Self-pay | Admitting: *Deleted

## 2019-08-18 ENCOUNTER — Encounter: Payer: Self-pay | Admitting: Physician Assistant

## 2019-08-18 ENCOUNTER — Other Ambulatory Visit: Payer: Self-pay

## 2019-08-18 ENCOUNTER — Ambulatory Visit (INDEPENDENT_AMBULATORY_CARE_PROVIDER_SITE_OTHER): Payer: No Typology Code available for payment source | Admitting: Physician Assistant

## 2019-08-18 VITALS — BP 126/70 | HR 57 | Ht 68.0 in | Wt 232.4 lb

## 2019-08-18 DIAGNOSIS — E119 Type 2 diabetes mellitus without complications: Secondary | ICD-10-CM

## 2019-08-18 DIAGNOSIS — E782 Mixed hyperlipidemia: Secondary | ICD-10-CM

## 2019-08-18 DIAGNOSIS — I10 Essential (primary) hypertension: Secondary | ICD-10-CM | POA: Diagnosis not present

## 2019-08-18 DIAGNOSIS — G4733 Obstructive sleep apnea (adult) (pediatric): Secondary | ICD-10-CM

## 2019-08-18 DIAGNOSIS — I2510 Atherosclerotic heart disease of native coronary artery without angina pectoris: Secondary | ICD-10-CM

## 2019-08-18 DIAGNOSIS — Z9989 Dependence on other enabling machines and devices: Secondary | ICD-10-CM

## 2019-08-18 NOTE — Patient Instructions (Addendum)
Medication Instructions:  STOP Plavix  *If you need a refill on your cardiac medications before your next appointment, please call your pharmacy*  Lab Work: None  If you have labs (blood work) drawn today and your tests are completely normal, you will receive your results only by: Marland Kitchen MyChart Message (if you have MyChart) OR . A paper copy in the mail If you have any lab test that is abnormal or we need to change your treatment, we will call you to review the results.  Testing/Procedures: None   Follow-Up: At [facility], you and your health needs are our priority.  As part of our continuing mission to provide you with exceptional heart care, we have created designated Provider Care Teams.  These Care Teams include your primary Cardiologist (physician) and Advanced Practice Providers (APPs -  Physician Assistants and Nurse Practitioners) who all work together to provide you with the care you need, when you need it.  Your next appointment:   12 months  The format for your next appointment:   In Person  Provider:   You may see [name], MD   Other Instructions PLEASE research addition of fenofibrate on top of Crestor and Vascepa on controlling triglyceride

## 2019-08-18 NOTE — Progress Notes (Signed)
Cardiology Office Note    Date:  08/20/2019   ID:  Jimmy Loretha Stapler., DOB 1966-02-13, MRN [id number]  PCP:  Dettinger, Fransisca Kaufmann, MD  Cardiologist:  Dr. Claiborne Billings   Chief Complaint  Patient presents with  . Follow-up    seen for Dr. Claiborne Billings.     History of Present Illness:  Jimmy Perez. is a 53 y.o. male with PMH of HTN, HLD, OSA on CPAP, CAD and diabetes. ETT on 10/04/2015 showed no ischemic changes, normal stress test.His younger brother had first MI at age 61. His father had 3 stents by age 34. He never smoked himself, he drank about 5-6 beers on the weekend.  I last saw the patient on 12/09/2017 for exertional chest pain. I discussed the case with Dr. Claiborne Billings who recommended cardiac catheterization.  Cardiac catheterization performed on 12/14/2017 showed single-vessel disease with 90% OM1 lesion treated with synergy 3.0 x 12 mm DES.  He has no other residual disease.  Post cath, patient was placed on aspirin and Plavix.  Due to elevated triglyceride, he was started on Vescepa on top of Crestor.  Patient presents today for cardiology office visit.  He he denies any recent chest pain shortness of breath, lower extremity edema, orthopnea or PND.  Recent lab work continued to show elevated triglyceride around 270.  His triglyceride goal is less than 150.  He is currently on the maximum dose of the Crestor and Vascepa.  We discussed the possibility of fenofibrate.  Fenofibrate may increase the probability of myalgia in this patient, however I think it is still would worth attempt to try the fenofibrate.  He wished to do some research on this along with his wife who is a Marine scientist.  Otherwise since he is doing quite good from the cardiology perspective, he can follow-up in 1 year.  He is more than 1 year out from the stent placement, I will discontinue his Plavix.  He will continue on aspirin lifelong.   Past Medical History:  Diagnosis Date  . Borderline diabetes    no meds, watching diet.   . Chronic kidney disease   . Diabetes mellitus without complication ([place])   . GERD (gastroesophageal reflux disease)   . Hyperlipidemia   . Hypertension   . Kidney stones   . OSA (obstructive sleep apnea)    Cpap use--study 12-26-14 Epic suggestive of this.  . Pre-diabetes    "per patient"  . Vitamin D deficiency     Past Surgical History:  Procedure Laterality Date  . CORONARY STENT INTERVENTION N/A 12/14/2017   Procedure: CORONARY STENT INTERVENTION;  Surgeon: [name], Peter M, MD;  Location: [facility];  Service: Cardiovascular;  Laterality: N/A;  . CYSTOSCOPY WITH RETROGRADE PYELOGRAM, URETEROSCOPY AND STENT PLACEMENT Right 05/29/2017   Procedure: CYSTOSCOPY WITH RETROGRADE PYELOGRAM, URETEROSCOPY AND STENT PLACEMENT;  Surgeon: [name], MD;  Location: [facility];  Service: Urology;  Laterality: Right;  . EUS N/A 02/08/2015   Procedure: UPPER ENDOSCOPIC ULTRASOUND (EUS) LINEAR;  Surgeon: [name], MD;  Location: [facility];  Service: Endoscopy;  Laterality: N/A;  . EUS N/A 09/25/2016   Procedure: UPPER ENDOSCOPIC ULTRASOUND (EUS) RADIAL;  Surgeon: [name], MD;  Location: [facility];  Service: Endoscopy;  Laterality: N/A;  . HOLMIUM LASER APPLICATION Right 01/25/9621   Procedure: HOLMIUM LASER APPLICATION;  Surgeon: [name], MD;  Location: [facility];  Service: Urology;  Laterality: Right;  . KNEE ARTHROSCOPY Right 11/28/95   [name])  .  LEFT HEART CATH AND CORONARY ANGIOGRAPHY N/A 12/14/2017   Procedure: LEFT HEART CATH AND CORONARY ANGIOGRAPHY;  Surgeon: [name], Peter M, MD;  Location: [facility];  Service: Cardiovascular;  Laterality: N/A;    Current Medications: Outpatient Medications Prior to Visit  Medication Sig Dispense Refill  . amLODipine (NORVASC) 5 MG tablet Take 1 tablet (5 mg total) by mouth daily. TAKE 1 TABLET BY MOUTH ONCE DAILY 90 tablet 3  . aspirin EC 81 MG tablet Take 81 mg by mouth daily.    . Blood Glucose Monitoring  Suppl (FREESTYLE FREEDOM LITE) w/Device KIT Check BS once daily 1 each 0  . busPIRone (BUSPAR) 15 MG tablet Take 1 tablet (15 mg total) by mouth 2 (two) times daily as needed. 180 tablet 3  . clopidogrel (PLAVIX) 75 MG tablet Take 1 tablet (75 mg total) by mouth daily. 90 tablet 3  . colchicine 0.6 MG tablet Take 1.2 mg (2 tablets) by mouth once, then take 0.6 mg one hour later. Use as needed for gout flare. Do not repeat for at least 3 days. 9 tablet 2  . glucose blood (FREESTYLE TEST STRIPS) test strip E11.9  Test once daily 100 each 12  . Icosapent Ethyl (VASCEPA) 1 g CAPS Take 2 capsules (2 g total) by mouth 2 (two) times daily. TAKE 2 CAPSULES BY MOUTH TWO TIMES DAILY 360 capsule 3  . Lancets (FREESTYLE) lancets Stick once daily for glucose 100 each 12  . losartan (COZAAR) 50 MG tablet Take 1 tablet (50 mg total) by mouth daily. 90 tablet 3  . metFORMIN (GLUCOPHAGE) 500 MG tablet Take 1 tablet (500 mg total) by mouth daily with breakfast. TAKE 1 TABLET BY MOUTH ONCE DAILY WITH BREAKFAST 90 tablet 3  . metoprolol succinate (TOPROL-XL) 25 MG 24 hr tablet Take 1 tablet (25 mg total) by mouth daily. 90 tablet 3  . nitroGLYCERIN (NITROSTAT) 0.4 MG SL tablet Place 1 tablet (0.4 mg total) under the tongue every 5 (five) minutes as needed for chest pain. 25 tablet 1  . pantoprazole (PROTONIX) 40 MG tablet Take 1 tablet (40 mg total) by mouth daily. 90 tablet 3  . rosuvastatin (CRESTOR) 40 MG tablet Take 1 tablet (40 mg total) by mouth at bedtime. 90 tablet 3  . Vitamin D, Ergocalciferol, (DRISDOL) 50000 units CAPS capsule Take 1 capsule (50,000 Units total) by mouth every 7 (seven) days. 12 capsule 3   No facility-administered medications prior to visit.      Allergies:   Patient has no known allergies.   Social History   Socioeconomic History  . Marital status: Married    Spouse name: Tye [place]   . Number of children: 2  . Years of education: Not on file  . Highest education level: Not on file   Occupational History  . Occupation: Financial trader  Social Needs  . Financial resource strain: Not on file  . Food insecurity    Worry: Not on file    Inability: Not on file  . Transportation needs    Medical: Not on file    Non-medical: Not on file  Tobacco Use  . Smoking status: Never Smoker  . Smokeless tobacco: Current User    Types: Snuff  . Tobacco comment: off an on (pouches) of snuff  Substance and Sexual Activity  . Alcohol use: Yes    Alcohol/week: 6.0 standard drinks    Types: 6 Cans of beer per week    Comment: occasional- depending on the  occasion  . Drug use: No  . Sexual activity: Not on file  Lifestyle  . Physical activity    Days per week: Not on file    Minutes per session: Not on file  . Stress: Not on file  Relationships  . Social Herbalist on phone: Not on file    Gets together: Not on file    Attends religious service: Not on file    Active member of club or organization: Not on file    Attends meetings of clubs or organizations: Not on file    Relationship status: Not on file  Other Topics Concern  . Not on file  Social History Narrative  . Not on file     Family History:  The patient's family history includes Colon polyps in his brother; Diabetes in his father, mother, and sister; Heart attack (age of onset: 26) in his brother; Heart disease in his father; Hyperlipidemia in his brother and father; Hypertension in his brother, father, and sister.   ROS:   Please see the history of present illness.    ROS All other systems reviewed and are negative.   PHYSICAL EXAM:   VS:  BP 126/70   Pulse (!) 57   Ht 5' 8" (1.727 m)   Wt 232 lb 6.4 oz (105.4 kg)   SpO2 95%   BMI 35.34 kg/m    GEN: Well nourished, well developed, in no acute distress  HEENT: normal  Neck: no JVD, carotid bruits, or masses Cardiac: RRR; no murmurs, rubs, or gallops,no edema  Respiratory:  clear to auscultation bilaterally, normal work of breathing GI:  soft, nontender, nondistended, + BS MS: no deformity or atrophy  Skin: warm and dry, no rash Neuro:  Alert and Oriented x 3, Strength and sensation are intact Psych: euthymic mood, full affect  Wt Readings from Last 3 Encounters:  08/18/19 232 lb 6.4 oz (105.4 kg)  07/19/19 233 lb (105.7 kg)  06/02/19 225 lb (102.1 kg)      Studies/Labs Reviewed:   EKG:  EKG is ordered today.  The ekg ordered today demonstrates sinus bradycardia, no significant ST-T wave changes  Recent Labs: 08/09/2019: ALT 24; BUN 13; Creatinine, Ser 0.87; Hemoglobin 14.1; Platelets 258; Potassium 4.6; Sodium 142   Lipid Panel    Component Value Date/Time   CHOL 131 08/09/2019 1637   TRIG 274 (H) 08/09/2019 1637   HDL 40 08/09/2019 1637   CHOLHDL 3.3 08/09/2019 1637   CHOLHDL 5.4 04/18/2013 0916   VLDL 76 (H) 04/18/2013 0916   LDLCALC 49 08/09/2019 1637   LDLDIRECT 88 06/06/2015 1527    Additional studies/ records that were reviewed today include:   Cath 12/14/2017  Ost 1st Mrg lesion is 90% stenosed.  Post intervention, there is a 0% residual stenosis.  A drug-eluting stent was successfully placed using a STENT SYNERGY DES 3X12.  The left ventricular systolic function is normal.  LV end diastolic pressure is normal.  The left ventricular ejection fraction is 55-65% by visual estimate.   1. Single vessel obstructive CAD 2. Normal LV function 3. Normal LVEDP 4. Successful PCI of the first OM with DES  Plan: DAPT for one year. High intensity statin. Resume Metformin in 2 days. Patient is a candidate for same day discharge.   ASSESSMENT:    1. Coronary artery disease involving native coronary artery of native heart without angina pectoris   2. Essential hypertension   3. Mixed hyperlipidemia   4.  OSA on CPAP   5. Controlled type 2 diabetes mellitus without complication, without long-term current use of insulin (HCC)      PLAN:  In order of problems listed above:  1. CAD: Last PCI  in February 2019.  Since he is more than 1 year out from PCI, he may discontinue the Plavix.  He is aware that he needs aspirin lifelong.  2. Hypertension: Blood pressure stable on current therapy  3. Hyperlipidemia: Triglycerides remain elevated.  We discussed possibly adding fenofibrate, he wished to research this with his wife first  4. Obstructive sleep apnea: Continue CPAP therapy  5. DM2: Managed by primary care provider    Medication Adjustments/Labs and Tests Ordered: Current medicines are reviewed at length with the patient today.  Concerns regarding medicines are outlined above.  Medication changes, Labs and Tests ordered today are listed in the Patient Instructions below. Patient Instructions  Medication Instructions:  STOP Plavix  *If you need a refill on your cardiac medications before your next appointment, please call your pharmacy*  Lab Work: None  If you have labs (blood work) drawn today and your tests are completely normal, you will receive your results only by: Marland Kitchen MyChart Message (if you have MyChart) OR . A paper copy in the mail If you have any lab test that is abnormal or we need to change your treatment, we will call you to review the results.  Testing/Procedures: None   Follow-Up: At [facility], you and your health needs are our priority.  As part of our continuing mission to provide you with exceptional heart care, we have created designated Provider Care Teams.  These Care Teams include your primary Cardiologist (physician) and Advanced Practice Providers (APPs -  Physician Assistants and Nurse Practitioners) who all work together to provide you with the care you need, when you need it.  Your next appointment:   12 months  The format for your next appointment:   In Person  Provider:   You may see [name], MD   Other Instructions PLEASE research addition of fenofibrate on top of Crestor and Vascepa on controlling triglyceride     Weston Brass  Almyra Deforest, [place]  08/20/2019 9:46 PM    [facility] [address], [place], [place]  [address] Phone: [phone]; Fax: [phone]

## 2019-08-20 ENCOUNTER — Encounter: Payer: Self-pay | Admitting: Physician Assistant

## 2019-09-02 MED FILL — LOSARTAN POTASSIUM 50 MG TA: 50 | 90 days supply | Qty: 90 | Fill #0

## 2019-09-12 ENCOUNTER — Ambulatory Visit (INDEPENDENT_AMBULATORY_CARE_PROVIDER_SITE_OTHER): Payer: No Typology Code available for payment source | Admitting: *Deleted

## 2019-09-12 DIAGNOSIS — Z23 Encounter for immunization: Secondary | ICD-10-CM

## 2019-10-11 MED FILL — ROSUVASTATIN CALCIUM 40 MG: 40 | 90 days supply | Qty: 90 | Fill #2

## 2019-10-12 ENCOUNTER — Other Ambulatory Visit: Payer: Self-pay

## 2019-10-12 ENCOUNTER — Ambulatory Visit: Payer: No Typology Code available for payment source | Attending: Internal Medicine

## 2019-10-12 DIAGNOSIS — Z20822 Contact with and (suspected) exposure to covid-19: Secondary | ICD-10-CM

## 2019-10-13 LAB — NOVEL CORONAVIRUS, NAA: SARS-CoV-2, NAA: NOT DETECTED

## 2019-10-20 ENCOUNTER — Telehealth: Payer: Self-pay | Admitting: Family Medicine

## 2019-10-20 NOTE — Telephone Encounter (Signed)
Patient called back received negative test result on My Chart

## 2019-10-24 MED FILL — PANTOPRAZOLE SOD DR 40 MG T: 40 | 90 days supply | Qty: 90 | Fill #0

## 2019-10-24 MED FILL — AMLODIPINE BESYLATE 5 MG TA: 5 | 90 days supply | Qty: 90 | Fill #0

## 2019-10-24 MED FILL — METOPROLOL SUCCINATE ER 25: 25 | 90 days supply | Qty: 90 | Fill #0

## 2019-11-17 MED FILL — VASCEPA 1 GM CAPSULE: 1 | 90 days supply | Qty: 360 | Fill #0

## 2019-11-17 MED FILL — metFORMIN HCL 500 MG TABS: 500 | 90 days supply | Qty: 90 | Fill #1

## 2019-12-01 MED FILL — LOSARTAN POTASSIUM 50 MG TA: 50 | 90 days supply | Qty: 90 | Fill #1

## 2019-12-08 ENCOUNTER — Telehealth (INDEPENDENT_AMBULATORY_CARE_PROVIDER_SITE_OTHER): Payer: No Typology Code available for payment source | Admitting: Family Medicine

## 2019-12-08 ENCOUNTER — Encounter: Payer: Self-pay | Admitting: Family Medicine

## 2019-12-08 DIAGNOSIS — J329 Chronic sinusitis, unspecified: Secondary | ICD-10-CM

## 2019-12-08 DIAGNOSIS — B9789 Other viral agents as the cause of diseases classified elsewhere: Secondary | ICD-10-CM | POA: Diagnosis not present

## 2019-12-08 DIAGNOSIS — U071 COVID-19: Secondary | ICD-10-CM

## 2019-12-08 MED ORDER — FLUTICASONE PROPIONATE 50 MCG/ACT NA SUSP: 1.0000 | Freq: Two times a day (BID) | NASAL | 6 refills | Status: DC | PRN

## 2019-12-08 MED ORDER — AZITHROMYCIN 250 MG PO TABS: ORAL_TABLET | ORAL | 0 refills | Status: DC

## 2019-12-08 NOTE — Progress Notes (Signed)
Virtual Visit via MyChart video Note  I connected with Jimmy Perez. on 12/08/19 at 1535 by video and verified that I am speaking with the correct person using two identifiers. Jimmy Perez. is currently located at home and wife  are currently with her during visit. The provider, Fransisca Kaufmann Jennessa Trigo, MD is located in their office at time of visit.  Call ended at 1550  I discussed the limitations, risks, security and privacy concerns of performing an evaluation and management service by video and the availability of in person appointments. I also discussed with the patient that there may be a patient responsible charge related to this service. The patient expressed understanding and agreed to proceed.   History and Present Illness: He has started vit c and zinc and elderberry this week.  He was diagnosed with covid last week and had headache and sinus and cough.  He has improved and still has mainly mild headache and sinus congestion. He tested positive again today. He denies any fevers or chills. He has been using alka seltzer and tylenol and aspirin. He is much improved but not clearing it.  His achiness is gone and fatigue is improved. He has more congestion.   No diagnosis found.  Outpatient Encounter Medications as of 12/08/2019  Medication Sig  . amLODipine (NORVASC) 5 MG tablet Take 1 tablet (5 mg total) by mouth daily. TAKE 1 TABLET BY MOUTH ONCE DAILY  . aspirin EC 81 MG tablet Take 81 mg by mouth daily.  . Blood Glucose Monitoring Suppl (FREESTYLE FREEDOM LITE) w/Device KIT Check BS once daily  . busPIRone (BUSPAR) 15 MG tablet Take 1 tablet (15 mg total) by mouth 2 (two) times daily as needed.  . clopidogrel (PLAVIX) 75 MG tablet Take 1 tablet (75 mg total) by mouth daily.  . colchicine 0.6 MG tablet Take 1.2 mg (2 tablets) by mouth once, then take 0.6 mg one hour later. Use as needed for gout flare. Do not repeat for at least 3 days.  Marland Kitchen glucose blood (FREESTYLE TEST  STRIPS) test strip E11.9  Test once daily  . Icosapent Ethyl (VASCEPA) 1 g CAPS Take 2 capsules (2 g total) by mouth 2 (two) times daily. TAKE 2 CAPSULES BY MOUTH TWO TIMES DAILY  . Lancets (FREESTYLE) lancets Stick once daily for glucose  . losartan (COZAAR) 50 MG tablet Take 1 tablet (50 mg total) by mouth daily.  . metFORMIN (GLUCOPHAGE) 500 MG tablet Take 1 tablet (500 mg total) by mouth daily with breakfast. TAKE 1 TABLET BY MOUTH ONCE DAILY WITH BREAKFAST  . metoprolol succinate (TOPROL-XL) 25 MG 24 hr tablet Take 1 tablet (25 mg total) by mouth daily.  . nitroGLYCERIN (NITROSTAT) 0.4 MG SL tablet Place 1 tablet (0.4 mg total) under the tongue every 5 (five) minutes as needed for chest pain.  . pantoprazole (PROTONIX) 40 MG tablet Take 1 tablet (40 mg total) by mouth daily.  . rosuvastatin (CRESTOR) 40 MG tablet Take 1 tablet (40 mg total) by mouth at bedtime.  . Vitamin D, Ergocalciferol, (DRISDOL) 50000 units CAPS capsule Take 1 capsule (50,000 Units total) by mouth every 7 (seven) days.   No facility-administered encounter medications on file as of 12/08/2019.    Review of Systems  Constitutional: Negative for chills and fever.  HENT: Positive for congestion, postnasal drip, rhinorrhea, sinus pressure, sneezing and sore throat. Negative for ear discharge, ear pain and voice change.   Eyes: Negative for pain, discharge, redness  and visual disturbance.  Respiratory: Positive for cough. Negative for shortness of breath and wheezing.   Cardiovascular: Negative for chest pain and leg swelling.  Musculoskeletal: Negative for gait problem.  Skin: Negative for rash.  All other systems reviewed and are negative.   Observations/Objective: Patient sounds and looks comfortable and in no acute distress  Assessment and Plan: Problem List Items Addressed This Visit    None    Visit Diagnoses    Viral sinusitis    -  Primary   Relevant Medications   azithromycin (ZITHROMAX) 250 MG tablet     fluticasone (FLONASE) 50 MCG/ACT nasal spray   COVID-19       Relevant Medications   azithromycin (ZITHROMAX) 250 MG tablet   fluticasone (FLONASE) 50 MCG/ACT nasal spray      Will try short course of azithromycin and Flonase to see if it helps, if not improved, we can always try dexamethasone in the future. Follow up plan: Return if symptoms worsen or fail to improve.     I discussed the assessment and treatment plan with the patient. The patient was provided an opportunity to ask questions and all were answered. The patient agreed with the plan and demonstrated an understanding of the instructions.   The patient was advised to call back or seek an in-person evaluation if the symptoms worsen or if the condition fails to improve as anticipated.  The above assessment and management plan was discussed with the patient. The patient verbalized understanding of and has agreed to the management plan. Patient is aware to call the clinic if symptoms persist or worsen. Patient is aware when to return to the clinic for a follow-up visit. Patient educated on when it is appropriate to go to the emergency department.    I provided 15 minutes of non-face-to-face time during this encounter.    Worthy Rancher, MD

## 2019-12-09 MED FILL — FLUTICASONE PROP 50 MCG SPR: 50 | 30 days supply | Qty: 16 | Fill #0

## 2019-12-09 MED FILL — AZITHROMYCIN 250 MG TABLET: 250 | 5 days supply | Qty: 6 | Fill #0

## 2020-01-03 IMAGING — DX DG SHOULDER 2+V*R*
3 series · 3 of 3 positions shown · non-contrast
Comparison: None.

CLINICAL DATA: Acute right shoulder pain.

EXAM:
RIGHT SHOULDER - 2+ VIEW

[shoulder ap]
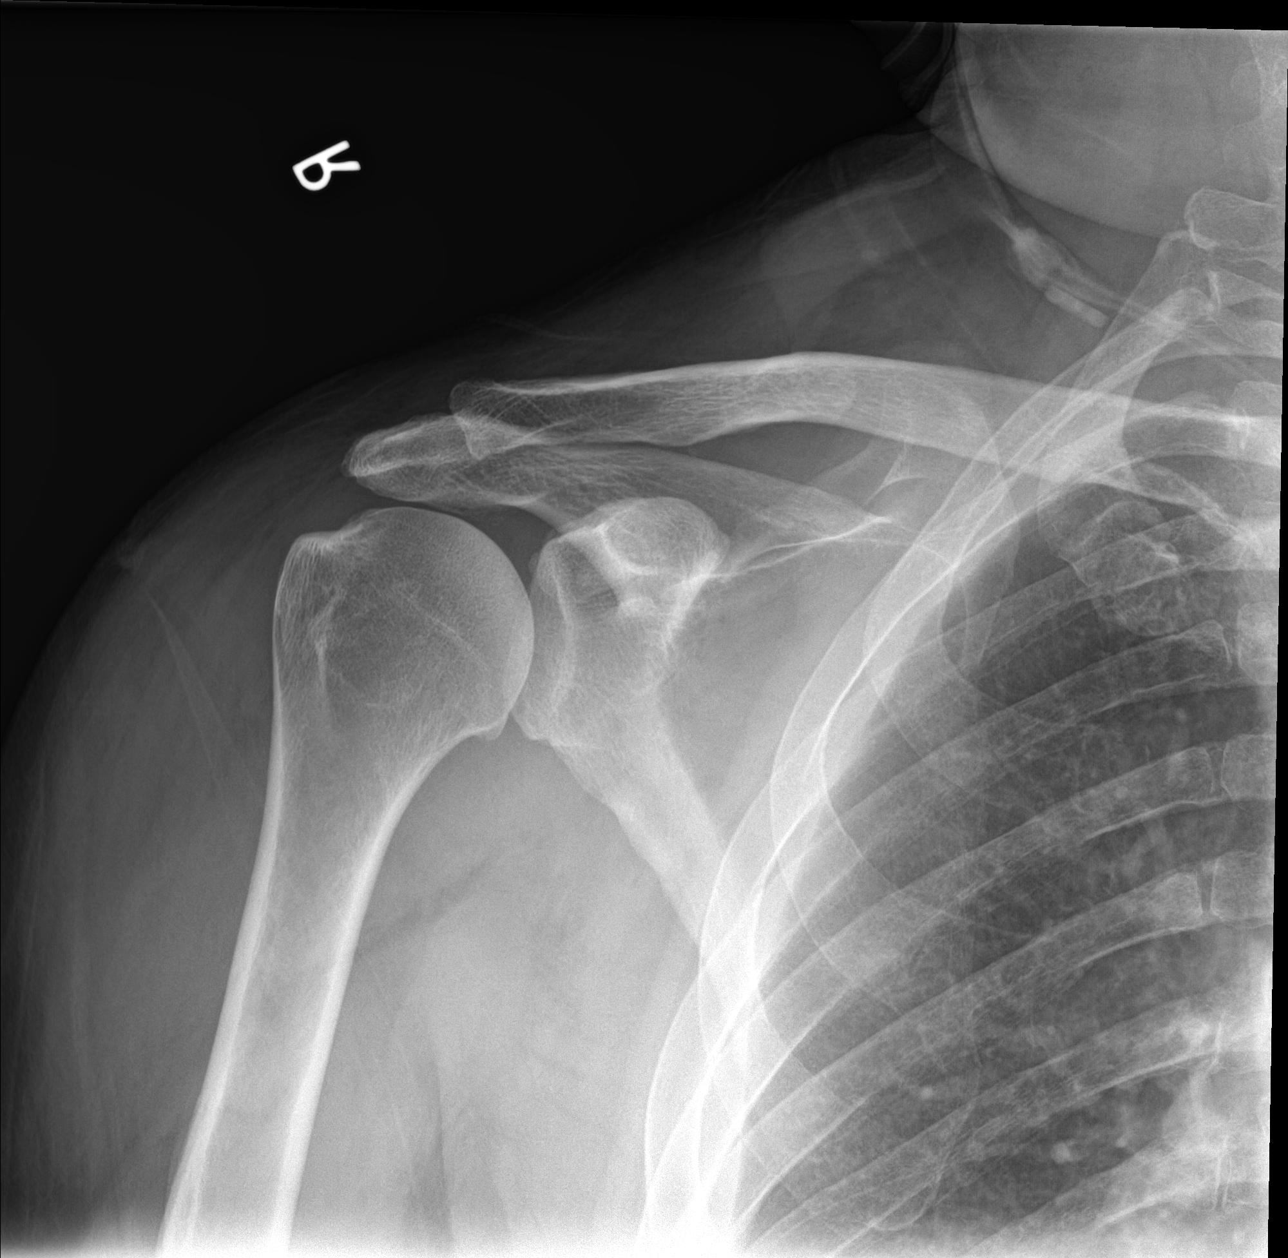

[shoulder obl]
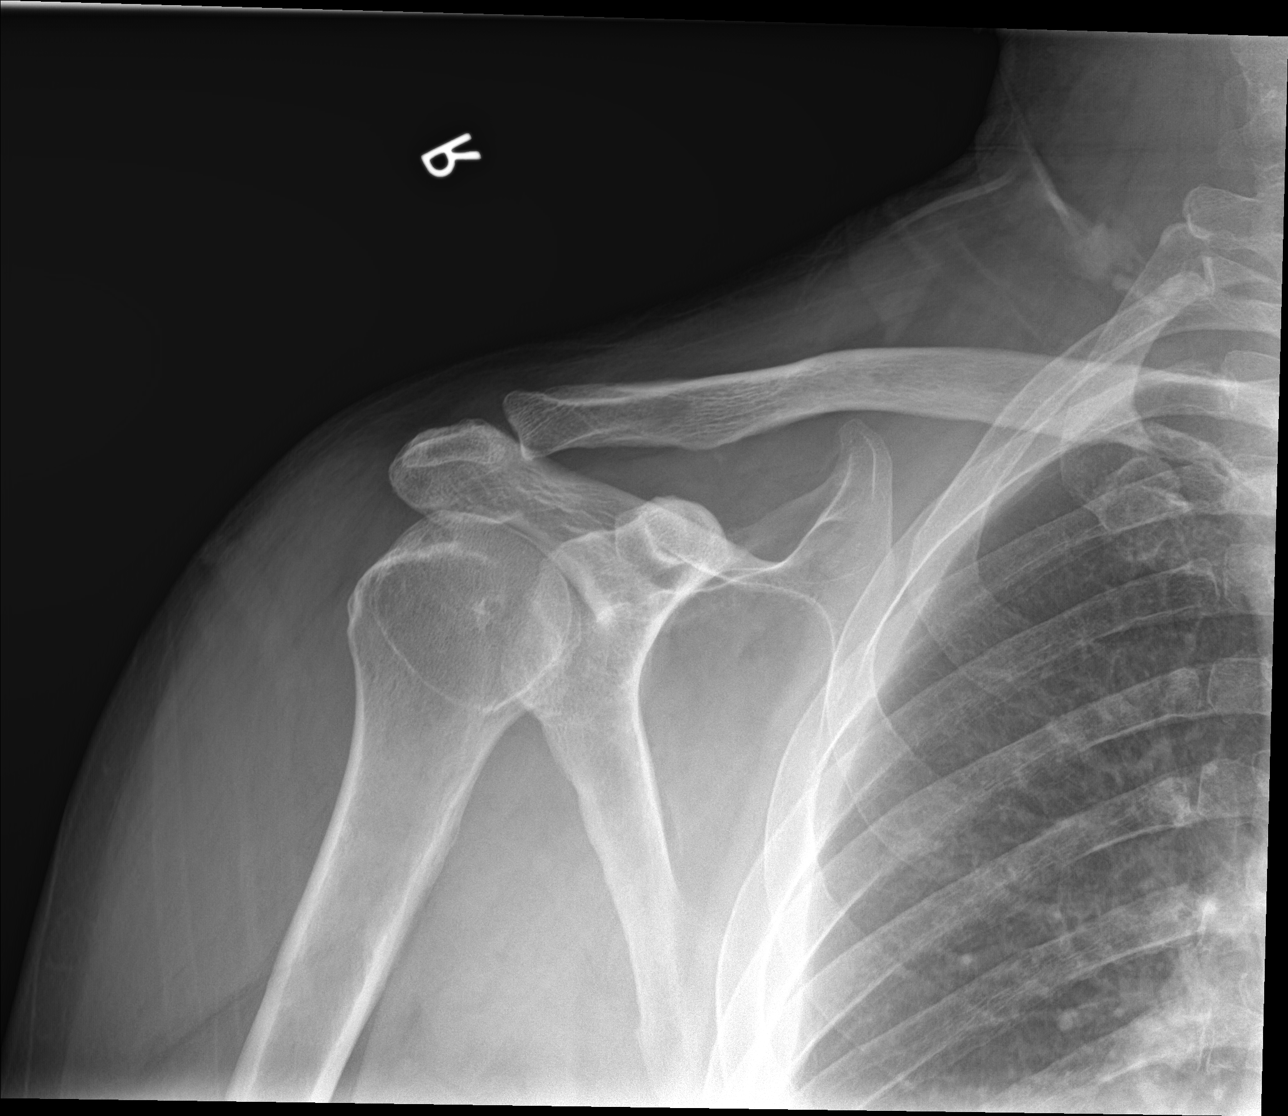

[shoulder axial]
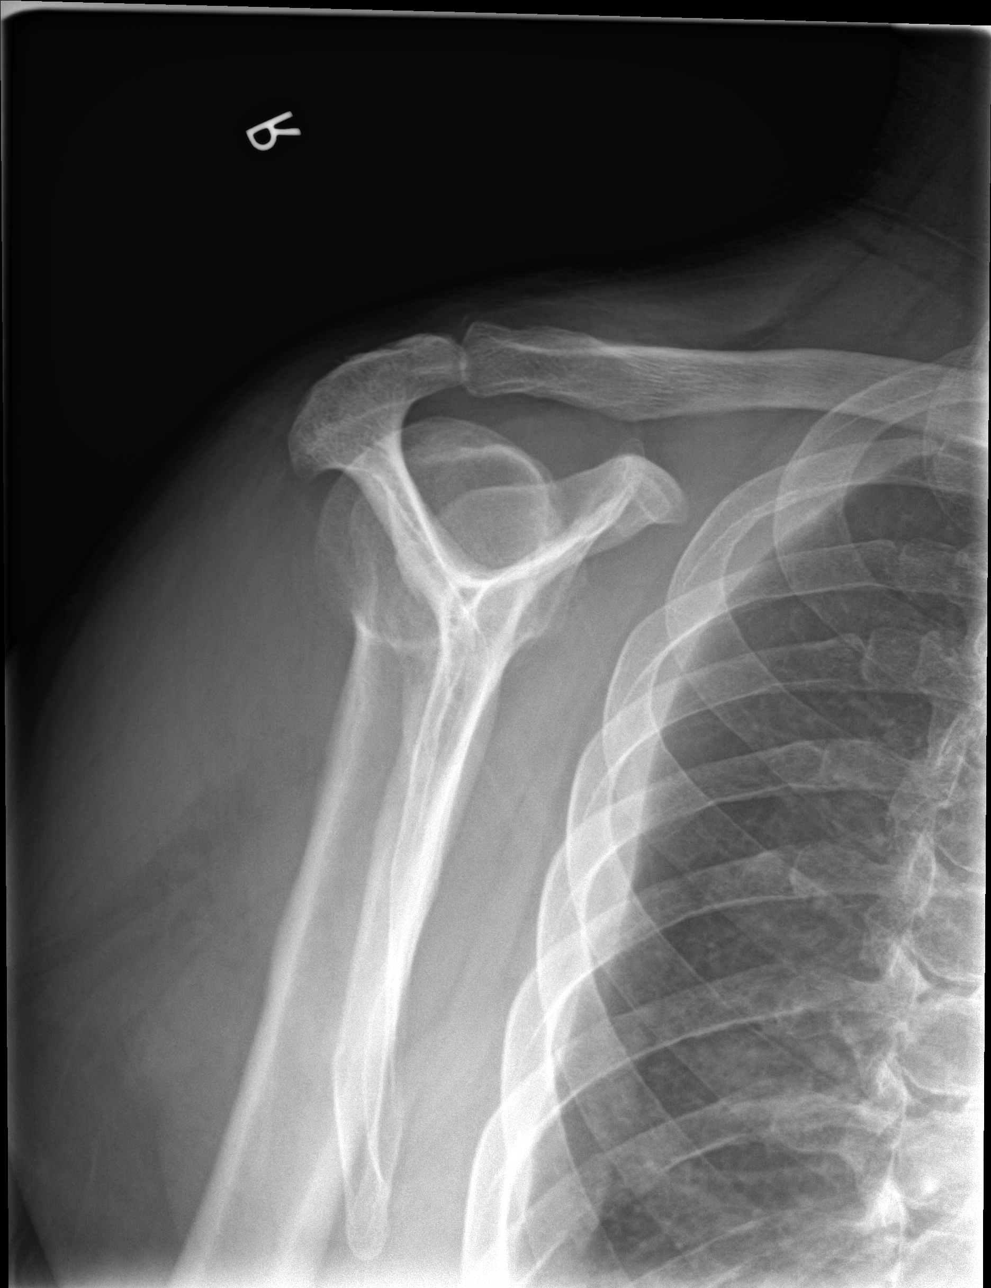

[3 of 3 positions shown; findings below may reference images not displayed]

FINDINGS: There is no evidence of fracture or dislocation. There is no
evidence of arthropathy or other focal bone abnormality. Soft
tissues are unremarkable.
IMPRESSION: Negative.

## 2020-01-12 MED FILL — ROSUVASTATIN CALCIUM 40 MG: 40 | 90 days supply | Qty: 90 | Fill #3

## 2020-01-31 ENCOUNTER — Other Ambulatory Visit: Payer: Self-pay | Admitting: Family Medicine

## 2020-01-31 MED ORDER — BUSPIRONE HCL 15 MG PO TABS: 15.0000 mg | ORAL_TABLET | Freq: Two times a day (BID) | ORAL | 3 refills | Status: DC | PRN

## 2020-01-31 MED FILL — AMLODIPINE BESYLATE 5 MG TA: 5 | 90 days supply | Qty: 90 | Fill #1

## 2020-01-31 MED FILL — METOPROLOL SUCCINATE ER 25: 25 | 90 days supply | Qty: 90 | Fill #1

## 2020-01-31 MED FILL — PANTOPRAZOLE SOD DR 40 MG T: 40 | 90 days supply | Qty: 90 | Fill #1

## 2020-01-31 MED FILL — busPIRone HCL 15 MG TABS: 15 | 90 days supply | Qty: 180 | Fill #0

## 2020-02-02 ENCOUNTER — Other Ambulatory Visit: Payer: Self-pay

## 2020-02-02 ENCOUNTER — Ambulatory Visit (INDEPENDENT_AMBULATORY_CARE_PROVIDER_SITE_OTHER): Payer: No Typology Code available for payment source | Admitting: Family Medicine

## 2020-02-02 ENCOUNTER — Other Ambulatory Visit: Payer: Self-pay | Admitting: Family Medicine

## 2020-02-02 ENCOUNTER — Encounter: Payer: Self-pay | Admitting: Family Medicine

## 2020-02-02 VITALS — BP 124/71 | HR 73 | Temp 99.8°F | Ht 68.0 in | Wt 239.2 lb

## 2020-02-02 DIAGNOSIS — I10 Essential (primary) hypertension: Secondary | ICD-10-CM

## 2020-02-02 DIAGNOSIS — E1169 Type 2 diabetes mellitus with other specified complication: Secondary | ICD-10-CM

## 2020-02-02 DIAGNOSIS — E782 Mixed hyperlipidemia: Secondary | ICD-10-CM | POA: Diagnosis not present

## 2020-02-02 DIAGNOSIS — K219 Gastro-esophageal reflux disease without esophagitis: Secondary | ICD-10-CM

## 2020-02-02 DIAGNOSIS — M7522 Bicipital tendinitis, left shoulder: Secondary | ICD-10-CM

## 2020-02-02 DIAGNOSIS — M722 Plantar fascial fibromatosis: Secondary | ICD-10-CM

## 2020-02-02 LAB — BAYER DCA HB A1C WAIVED: HB A1C (BAYER DCA - WAIVED): 8.3 % — ABNORMAL HIGH (ref <7.0)

## 2020-02-02 MED ORDER — SITAGLIPTIN PHOSPHATE 100 MG PO TABS: 100.0000 mg | ORAL_TABLET | Freq: Every day | ORAL | 3 refills | Status: DC

## 2020-02-02 MED FILL — JANUVIA 100 MG TABLET: 100 | 30 days supply | Qty: 30 | Fill #0

## 2020-02-02 NOTE — Progress Notes (Signed)
BP 124/71   Pulse 73   Temp 99.8 F (37.7 C)   Ht '5\' 8"'  (1.727 m)   Wt 239 lb 4 oz (108.5 kg)   SpO2 95%   BMI 36.38 kg/m    Subjective:   Patient ID: Jimmy Loretha Stapler., male    DOB: 04-10-1966, 54 y.o.   MRN: [id number]  HPI: Jimmy Rose Hippler. is a 54 y.o. male presenting on 02/02/2020 for Medical Management of Chronic Issues and Hyperlipidemia   HPI Type 2 diabetes mellitus Patient comes in today for recheck of his diabetes. Patient has been currently taking Metformin and Januvia. Patient is currently on an ACE inhibitor/ARB. Patient has not seen an ophthalmologist this year. Patient denies any issues with their feet.  A1c is up to 8.3  Hyperlipidemia Patient is coming in for recheck of his hyperlipidemia. The patient is currently taking Vascepa Crestor. They deny any issues with myalgias or history of liver damage from it. They deny any focal numbness or weakness or chest pain.   Hypertension Patient is currently on amlodipine and losartan and metoprolol, and their blood pressure today is 124/71. Patient denies any lightheadedness or dizziness. Patient denies headaches, blurred vision, chest pains, shortness of breath, or weakness. Denies any side effects from medication and is content with current medication.   GERD Patient is currently on pantoprazole.  She denies any major symptoms or abdominal pain or belching or burping. She denies any blood in her stool or lightheadedness or dizziness.   Relevant past medical, surgical, family and social history reviewed and updated as indicated. Interim medical history since our last visit reviewed. Allergies and medications reviewed and updated.  Review of Systems  Constitutional: Negative for chills and fever.  Eyes: Negative for visual disturbance.  Respiratory: Negative for shortness of breath and wheezing.   Cardiovascular: Negative for chest pain and leg swelling.  Gastrointestinal: Negative for abdominal pain.    Musculoskeletal: Positive for arthralgias. Negative for back pain and gait problem.  Skin: Negative for rash.  Neurological: Negative for dizziness, weakness and numbness.  All other systems reviewed and are negative.  Per HPI unless specifically indicated above   Allergies as of 02/02/2020   No Known Allergies     Medication List       Accurate as of February 02, 2020 11:59 PM. If you have any questions, ask your nurse or doctor.        STOP taking these medications   azithromycin 250 MG tablet Commonly known as: ZITHROMAX Stopped by: Fransisca Kaufmann Zannah Melucci, MD     TAKE these medications   amLODipine 5 MG tablet Commonly known as: NORVASC Take 1 tablet (5 mg total) by mouth daily. TAKE 1 TABLET BY MOUTH ONCE DAILY   aspirin EC 81 MG tablet Take 81 mg by mouth daily.   busPIRone 15 MG tablet Commonly known as: BUSPAR Take 1 tablet (15 mg total) by mouth 2 (two) times daily as needed.   colchicine 0.6 MG tablet Take 1.2 mg (2 tablets) by mouth once, then take 0.6 mg one hour later. Use as needed for gout flare. Do not repeat for at least 3 days.   fluticasone 50 MCG/ACT nasal spray Commonly known as: FLONASE Place 1 spray into both nostrils 2 (two) times daily as needed for allergies or rhinitis.   FreeStyle Freedom Lite w/Device Kit Check BS once daily   freestyle lancets Stick once daily for glucose   glucose blood test strip Commonly known  as: FREESTYLE TEST STRIPS E11.9  Test once daily   losartan 50 MG tablet Commonly known as: COZAAR Take 1 tablet (50 mg total) by mouth daily.   metFORMIN 500 MG tablet Commonly known as: GLUCOPHAGE Take 1 tablet (500 mg total) by mouth daily with breakfast. TAKE 1 TABLET BY MOUTH ONCE DAILY WITH BREAKFAST   metoprolol succinate 25 MG 24 hr tablet Commonly known as: TOPROL-XL Take 1 tablet (25 mg total) by mouth daily.   nitroGLYCERIN 0.4 MG SL tablet Commonly known as: NITROSTAT Place 1 tablet (0.4 mg total) under the  tongue every 5 (five) minutes as needed for chest pain.   pantoprazole 40 MG tablet Commonly known as: Protonix Take 1 tablet (40 mg total) by mouth daily.   rosuvastatin 40 MG tablet Commonly known as: CRESTOR Take 1 tablet (40 mg total) by mouth at bedtime.   sitaGLIPtin 100 MG tablet Commonly known as: Januvia Take 1 tablet (100 mg total) by mouth daily. Started by: Fransisca Kaufmann Mikyla Schachter, MD   Vascepa 1 g capsule Generic drug: icosapent Ethyl Take 2 capsules (2 g total) by mouth 2 (two) times daily. TAKE 2 CAPSULES BY MOUTH TWO TIMES DAILY   VITAMIN D PO Take 5,000 mg by mouth.        Objective:   BP 124/71   Pulse 73   Temp 99.8 F (37.7 C)   Ht '5\' 8"'  (1.727 m)   Wt 239 lb 4 oz (108.5 kg)   SpO2 95%   BMI 36.38 kg/m   Wt Readings from Last 3 Encounters:  02/02/20 239 lb 4 oz (108.5 kg)  08/18/19 232 lb 6.4 oz (105.4 kg)  07/19/19 233 lb (105.7 kg)    Physical Exam Vitals and nursing note reviewed.  Constitutional:      General: He is not in acute distress.    Appearance: He is well-developed. He is not diaphoretic.  Eyes:     General: No scleral icterus.    Conjunctiva/sclera: Conjunctivae normal.  Neck:     Thyroid: No thyromegaly.  Cardiovascular:     Rate and Rhythm: Normal rate and regular rhythm.     Heart sounds: Normal heart sounds. No murmur.  Pulmonary:     Effort: Pulmonary effort is normal. No respiratory distress.     Breath sounds: Normal breath sounds. No wheezing.  Musculoskeletal:        General: Tenderness (Anterior tenderness in both bicipital tendon) present.     Cervical back: Neck supple.  Lymphadenopathy:     Cervical: No cervical adenopathy.  Skin:    General: Skin is warm and dry.     Findings: No rash.  Neurological:     Mental Status: He is alert and oriented to person, place, and time.     Coordination: Coordination normal.  Psychiatric:        Behavior: Behavior normal.       Assessment & Plan:   Problem List  Items Addressed This Visit      Cardiovascular and Mediastinum   HTN (hypertension)   Relevant Orders   CMP14+EGFR (Completed)   CBC with Differential/Platelet (Completed)     Digestive   GERD (gastroesophageal reflux disease)     Endocrine   Type 2 diabetes mellitus with other specified complication ([place]) - Primary   Relevant Medications   sitaGLIPtin (JANUVIA) 100 MG tablet   Other Relevant Orders   Bayer DCA Hb A1c Waived (Completed)     Other   Hyperlipidemia  Relevant Orders   CBC with Differential/Platelet (Completed)   Lipid panel (Completed)   Bayer DCA Hb A1c Waived (Completed)   Morbid obesity (HCC)   Relevant Medications   sitaGLIPtin (JANUVIA) 100 MG tablet    Other Visit Diagnoses    Biceps tendinitis of left upper extremity       Plantar fasciitis, bilateral          For plantar fasciitis recommended bilateral new inserts for his shoes for more support.  For biceps tendinitis, recommended anti-inflammatories for a solid week and ice and watch out for motion such as repetitive pronation or supination that would flared up again.  We will add Januvia for diabetes because A1c is elevated. Follow up plan: Return in about 3 months (around 05/03/2020), or if symptoms worsen or fail to improve, for Recheck diabetes.  Counseling provided for all of the vaccine components Orders Placed This Encounter  Procedures  . CMP14+EGFR  . CBC with Differential/Platelet  . Lipid panel  . Bayer [facility] Hb A1c [place], MD [facility] 02/08/2020, 10:34 PM

## 2020-02-03 ENCOUNTER — Ambulatory Visit: Payer: No Typology Code available for payment source | Admitting: Family Medicine

## 2020-02-03 LAB — CBC WITH DIFFERENTIAL/PLATELET
Basophils Absolute: 0 10*3/uL (ref 0.0–0.2)
Basos: 1 %
EOS (ABSOLUTE): 0.1 10*3/uL (ref 0.0–0.4)
Eos: 1 %
Hematocrit: 44 % (ref 37.5–51.0)
Hemoglobin: 15.2 g/dL (ref 13.0–17.7)
Immature Grans (Abs): 0 10*3/uL (ref 0.0–0.1)
Immature Granulocytes: 0 %
Lymphocytes Absolute: 1.8 10*3/uL (ref 0.7–3.1)
Lymphs: 22 %
MCH: 32.4 pg (ref 26.6–33.0)
MCHC: 34.5 g/dL (ref 31.5–35.7)
MCV: 94 fL (ref 79–97)
Monocytes Absolute: 0.7 10*3/uL (ref 0.1–0.9)
Monocytes: 8 %
Neutrophils Absolute: 5.7 10*3/uL (ref 1.4–7.0)
Neutrophils: 68 %
Platelets: 276 10*3/uL (ref 150–450)
RBC: 4.69 x10E6/uL (ref 4.14–5.80)
RDW: 13.6 % (ref 11.6–15.4)
WBC: 8.4 10*3/uL (ref 3.4–10.8)

## 2020-02-03 LAB — CMP14+EGFR
ALT: 34 IU/L (ref 0–44)
AST: 24 IU/L (ref 0–40)
Albumin/Globulin Ratio: 2.2 (ref 1.2–2.2)
Albumin: 5.1 g/dL — ABNORMAL HIGH (ref 3.8–4.9)
Alkaline Phosphatase: 83 IU/L (ref 39–117)
BUN/Creatinine Ratio: 15 (ref 9–20)
BUN: 13 mg/dL (ref 6–24)
Bilirubin Total: 1.2 mg/dL (ref 0.0–1.2)
CO2: 21 mmol/L (ref 20–29)
Calcium: 10 mg/dL (ref 8.7–10.2)
Chloride: 102 mmol/L (ref 96–106)
Creatinine, Ser: 0.89 mg/dL (ref 0.76–1.27)
GFR calc Af Amer: 113 mL/min/{1.73_m2} (ref >59)
GFR calc non Af Amer: 98 mL/min/{1.73_m2} (ref >59)
Globulin, Total: 2.3 g/dL (ref 1.5–4.5)
Glucose: 134 mg/dL — ABNORMAL HIGH (ref 65–99)
Potassium: 4.2 mmol/L (ref 3.5–5.2)
Sodium: 140 mmol/L (ref 134–144)
Total Protein: 7.4 g/dL (ref 6.0–8.5)

## 2020-02-03 LAB — LIPID PANEL
Chol/HDL Ratio: 3.6 ratio (ref 0.0–5.0)
Cholesterol, Total: 141 mg/dL (ref 100–199)
HDL: 39 mg/dL — ABNORMAL LOW (ref >39)
LDL Chol Calc (NIH): 50 mg/dL (ref 0–99)
Triglycerides: 340 mg/dL — ABNORMAL HIGH (ref 0–149)
VLDL Cholesterol Cal: 52 mg/dL — ABNORMAL HIGH (ref 5–40)

## 2020-02-28 MED FILL — METFORMIN HCL 500 MG TABS: 500 | 90 days supply | Qty: 90 | Fill #2

## 2020-02-28 MED FILL — FLUTICASONE PROP 50 MCG SPR: 50 | 30 days supply | Qty: 16 | Fill #1

## 2020-03-23 ENCOUNTER — Encounter: Payer: Self-pay | Admitting: Family Medicine

## 2020-03-23 MED FILL — LOSARTAN POTASSIUM 50 MG TA: 50 | 90 days supply | Qty: 90 | Fill #2

## 2020-03-28 ENCOUNTER — Other Ambulatory Visit: Payer: Self-pay | Admitting: Family

## 2020-03-28 MED ORDER — DOXYCYCLINE HYCLATE 100 MG PO TABS: 100.0000 mg | ORAL_TABLET | Freq: Two times a day (BID) | ORAL | 0 refills | Status: DC

## 2020-03-28 MED FILL — DOXYCYCLINE HYCLATE 100 MG: 100 | 21 days supply | Qty: 42 | Fill #0

## 2020-04-09 ENCOUNTER — Encounter: Payer: Self-pay | Admitting: Family Medicine

## 2020-04-09 ENCOUNTER — Ambulatory Visit (INDEPENDENT_AMBULATORY_CARE_PROVIDER_SITE_OTHER): Payer: No Typology Code available for payment source | Admitting: Family Medicine

## 2020-04-09 ENCOUNTER — Other Ambulatory Visit: Payer: Self-pay

## 2020-04-09 VITALS — BP 119/76 | HR 53 | Temp 97.1°F | Ht 68.0 in | Wt 241.0 lb

## 2020-04-09 DIAGNOSIS — S8011XA Contusion of right lower leg, initial encounter: Secondary | ICD-10-CM

## 2020-04-09 LAB — CBC WITH DIFFERENTIAL/PLATELET
Basophils Absolute: 0.1 10*3/uL (ref 0.0–0.2)
Basos: 1 %
EOS (ABSOLUTE): 0.2 10*3/uL (ref 0.0–0.4)
Eos: 2 %
Hematocrit: 43 % (ref 37.5–51.0)
Hemoglobin: 14.9 g/dL (ref 13.0–17.7)
Immature Grans (Abs): 0 10*3/uL (ref 0.0–0.1)
Immature Granulocytes: 1 %
Lymphocytes Absolute: 1.9 10*3/uL (ref 0.7–3.1)
Lymphs: 22 %
MCH: 32.7 pg (ref 26.6–33.0)
MCHC: 34.7 g/dL (ref 31.5–35.7)
MCV: 94 fL (ref 79–97)
Monocytes Absolute: 0.8 10*3/uL (ref 0.1–0.9)
Monocytes: 9 %
Neutrophils Absolute: 5.7 10*3/uL (ref 1.4–7.0)
Neutrophils: 65 %
Platelets: 263 10*3/uL (ref 150–450)
RBC: 4.56 x10E6/uL (ref 4.14–5.80)
RDW: 12.7 % (ref 11.6–15.4)
WBC: 8.6 10*3/uL (ref 3.4–10.8)

## 2020-04-09 LAB — D-DIMER, QUANTITATIVE: D-DIMER: 0.32 mg/L FEU (ref 0.00–0.49)
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
## 2020-04-24 ENCOUNTER — Other Ambulatory Visit: Payer: Self-pay | Admitting: *Deleted

## 2020-04-24 MED ORDER — ROSUVASTATIN CALCIUM 40 MG PO TABS: 40.0000 mg | ORAL_TABLET | Freq: Every day | ORAL | 0 refills | Status: DC

## 2020-04-24 MED FILL — ROSUVASTATIN CALCIUM 40 MG: 40 | 90 days supply | Qty: 90 | Fill #0

## 2020-04-24 MED FILL — VASCEPA 1 GM CAPSULE: 1 | 90 days supply | Qty: 360 | Fill #1

## 2020-05-14 MED FILL — AMLODIPINE BESYLATE 5 MG TA: 5 | 90 days supply | Qty: 90 | Fill #2

## 2020-05-14 MED FILL — METFORMIN HCL 500 MG TABS: 500 | 90 days supply | Qty: 90 | Fill #3

## 2020-05-14 MED FILL — METOPROLOL SUCCINATE ER 25: 25 | 90 days supply | Qty: 90 | Fill #2

## 2020-05-14 MED FILL — PANTOPRAZOLE SOD DR 40 MG T: 40 | 90 days supply | Qty: 90 | Fill #2

## 2020-06-04 ENCOUNTER — Ambulatory Visit: Payer: No Typology Code available for payment source | Admitting: Internal Medicine

## 2020-06-27 ENCOUNTER — Ambulatory Visit: Payer: No Typology Code available for payment source | Admitting: Family Medicine

## 2020-06-28 MED FILL — LOSARTAN POTASSIUM 50 MG TA: 50 | 90 days supply | Qty: 90 | Fill #3

## 2020-07-05 ENCOUNTER — Telehealth (INDEPENDENT_AMBULATORY_CARE_PROVIDER_SITE_OTHER): Payer: No Typology Code available for payment source | Admitting: Family Medicine

## 2020-07-05 ENCOUNTER — Other Ambulatory Visit: Payer: Self-pay | Admitting: Family Medicine

## 2020-07-05 ENCOUNTER — Encounter: Payer: Self-pay | Admitting: Family Medicine

## 2020-07-05 DIAGNOSIS — E1169 Type 2 diabetes mellitus with other specified complication: Secondary | ICD-10-CM

## 2020-07-05 DIAGNOSIS — I10 Essential (primary) hypertension: Secondary | ICD-10-CM

## 2020-07-05 DIAGNOSIS — E782 Mixed hyperlipidemia: Secondary | ICD-10-CM | POA: Diagnosis not present

## 2020-07-05 MED ORDER — METOPROLOL SUCCINATE ER 25 MG PO TB24
25.0000 mg | ORAL_TABLET | Freq: Every day | ORAL | 3 refills | Status: DC
Start: 2020-07-05 — End: 2020-07-05

## 2020-07-05 MED ORDER — METFORMIN HCL 500 MG PO TABS
500.0000 mg | ORAL_TABLET | Freq: Every day | ORAL | 3 refills | Status: DC
Start: 2020-07-05 — End: 2020-07-05

## 2020-07-05 MED ORDER — ICOSAPENT ETHYL 1 G PO CAPS
2.0000 g | ORAL_CAPSULE | Freq: Two times a day (BID) | ORAL | 3 refills | Status: DC
Start: 2020-07-05 — End: 2020-07-05

## 2020-07-05 MED ORDER — AMLODIPINE BESYLATE 5 MG PO TABS
5.0000 mg | ORAL_TABLET | Freq: Every day | ORAL | 3 refills | Status: DC
Start: 2020-07-05 — End: 2020-07-05

## 2020-07-05 MED ORDER — ROSUVASTATIN CALCIUM 40 MG PO TABS
40.0000 mg | ORAL_TABLET | Freq: Every day | ORAL | 0 refills | Status: DC
Start: 2020-07-05 — End: 2020-10-18

## 2020-07-05 MED ORDER — SITAGLIPTIN PHOSPHATE 100 MG PO TABS: 100.0000 mg | ORAL_TABLET | Freq: Every day | ORAL | 3 refills | Status: DC

## 2020-07-05 MED ORDER — LOSARTAN POTASSIUM 50 MG PO TABS
50.0000 mg | ORAL_TABLET | Freq: Every day | ORAL | 3 refills | Status: DC
Start: 2020-07-05 — End: 2020-10-09

## 2020-07-05 MED ORDER — PANTOPRAZOLE SODIUM 40 MG PO TBEC
40.0000 mg | DELAYED_RELEASE_TABLET | Freq: Every day | ORAL | 3 refills | Status: DC
Start: 2020-07-05 — End: 2020-07-05

## 2020-07-05 MED FILL — ROSUVASTATIN CALCIUM 40 MG: 40 | 90 days supply | Qty: 90 | Fill #0

## 2020-07-05 MED FILL — JANUVIA 100 MG TABLET: 100 | 30 days supply | Qty: 30 | Fill #0

## 2020-07-05 MED FILL — VASCEPA 1 GM CAPSULE: 1 | 90 days supply | Qty: 360 | Fill #0

## 2020-07-05 NOTE — Progress Notes (Signed)
Virtual Visit via Mychart Video Note  I connected with Jimmy Perez. on 07/05/20 at 1647 by video and verified that I am speaking with the correct person using two identifiers. Jimmy Perez. is currently located at home and patient are currently with her during visit. The provider, Fransisca Kaufmann Brentlee Delage, MD is located in their office at time of visit.  Call ended at 1659  I discussed the limitations, risks, security and privacy concerns of performing an evaluation and management service by video and the availability of in person appointments. I also discussed with the patient that there may be a patient responsible charge related to this service. The patient expressed understanding and agreed to proceed.   History and Present Illness: Hypertension Patient is currently on amlodipine and losartan and metoprolol, and their blood pressure today is 139/84. Patient denies any lightheadedness or dizziness. Patient denies headaches, blurred vision, chest pains, shortness of breath, or weakness. Denies any side effects from medication and is content with current medication.   Type 2 diabetes mellitus Patient comes in today for recheck of his diabetes. Patient has been currently taking metformin and [place]. Patient is currently on an ACE inhibitor/ARB. Patient has not seen an ophthalmologist this year. Patient denies any issues with their feet. The symptom started onset as an adult htn and hld ARE RELATED TO DM   Hyperlipidemia Patient is coming in for recheck of his hyperlipidemia. The patient is currently taking crestor. They deny any issues with myalgias or history of liver damage from it. They deny any focal numbness or weakness or chest pain.   No diagnosis found.  Outpatient Encounter Medications as of 07/05/2020  Medication Sig  . amLODipine (NORVASC) 5 MG tablet Take 1 tablet (5 mg total) by mouth daily. TAKE 1 TABLET BY MOUTH ONCE DAILY  . aspirin EC 81 MG tablet Take 81 mg by  mouth daily.  . Blood Glucose Monitoring Suppl (FREESTYLE FREEDOM LITE) w/Device KIT Check BS once daily  . busPIRone (BUSPAR) 15 MG tablet Take 1 tablet (15 mg total) by mouth 2 (two) times daily as needed.  . colchicine 0.6 MG tablet Take 1.2 mg (2 tablets) by mouth once, then take 0.6 mg one hour later. Use as needed for gout flare. Do not repeat for at least 3 days.  Marland Kitchen doxycycline (VIBRA-TABS) 100 MG tablet Take 1 tablet (100 mg total) by mouth 2 (two) times daily.  . fluticasone (FLONASE) 50 MCG/ACT nasal spray Place 1 spray into both nostrils 2 (two) times daily as needed for allergies or rhinitis.  Marland Kitchen glucose blood (FREESTYLE TEST STRIPS) test strip E11.9  Test once daily  . Icosapent Ethyl (VASCEPA) 1 g CAPS Take 2 capsules (2 g total) by mouth 2 (two) times daily. TAKE 2 CAPSULES BY MOUTH TWO TIMES DAILY  . Lancets (FREESTYLE) lancets Stick once daily for glucose  . losartan (COZAAR) 50 MG tablet Take 1 tablet (50 mg total) by mouth daily.  . metFORMIN (GLUCOPHAGE) 500 MG tablet Take 1 tablet (500 mg total) by mouth daily with breakfast. TAKE 1 TABLET BY MOUTH ONCE DAILY WITH BREAKFAST  . metoprolol succinate (TOPROL-XL) 25 MG 24 hr tablet Take 1 tablet (25 mg total) by mouth daily.  . nitroGLYCERIN (NITROSTAT) 0.4 MG SL tablet Place 1 tablet (0.4 mg total) under the tongue every 5 (five) minutes as needed for chest pain. (Patient not taking: Reported on 04/09/2020)  . pantoprazole (PROTONIX) 40 MG tablet Take 1 tablet (40 mg  total) by mouth daily.  . rosuvastatin (CRESTOR) 40 MG tablet Take 1 tablet (40 mg total) by mouth at bedtime.  . sitaGLIPtin (JANUVIA) 100 MG tablet Take 1 tablet (100 mg total) by mouth daily.  Marland Kitchen VITAMIN D PO Take 5,000 mg by mouth.   No facility-administered encounter medications on file as of 07/05/2020.    Review of Systems  Constitutional: Negative for chills and fever.  Eyes: Negative for visual disturbance.  Respiratory: Negative for shortness of breath and  wheezing.   Cardiovascular: Negative for chest pain and leg swelling.  Musculoskeletal: Negative for back pain and gait problem.  Skin: Negative for rash.  Neurological: Negative for dizziness, weakness and light-headedness.  All other systems reviewed and are negative.   Observations/Objective: Patient sounds comfortable and in no acute distress  Assessment and Plan: Problem List Items Addressed This Visit      Cardiovascular and Mediastinum   HTN (hypertension)   Relevant Orders   CBC with Differential/Platelet   CMP14+EGFR     Endocrine   Type 2 diabetes mellitus with other specified complication ([place]) - Primary   Relevant Orders   Bayer DCA Hb A1c Waived   TSH     Other   Hyperlipidemia   Relevant Orders   Lipid panel   Morbid obesity ([place])   Relevant Orders   Lipid panel      Continue current medication and no changes and do blood work tomorrow Follow up plan: Return in about 3 months (around 10/04/2020), or if symptoms worsen or fail to improve, for diabetes and htn and hld.     I discussed the assessment and treatment plan with the patient. The patient was provided an opportunity to ask questions and all were answered. The patient agreed with the plan and demonstrated an understanding of the instructions.   The patient was advised to call back or seek an in-person evaluation if the symptoms worsen or if the condition fails to improve as anticipated.  The above assessment and management plan was discussed with the patient. The patient verbalized understanding of and has agreed to the management plan. Patient is aware to call the clinic if symptoms persist or worsen. Patient is aware when to return to the clinic for a follow-up visit. Patient educated on when it is appropriate to go to the emergency department.    I provided 12 minutes of non-face-to-face time during this encounter.    Worthy Rancher, MD

## 2020-07-05 NOTE — Addendum Note (Signed)
Addended by: Caryl Pina on: 07/05/2020 05:06 PM   Modules accepted: Orders

## 2020-07-06 ENCOUNTER — Other Ambulatory Visit: Payer: No Typology Code available for payment source

## 2020-07-06 DIAGNOSIS — I10 Essential (primary) hypertension: Secondary | ICD-10-CM

## 2020-07-06 DIAGNOSIS — E782 Mixed hyperlipidemia: Secondary | ICD-10-CM

## 2020-07-06 DIAGNOSIS — E1169 Type 2 diabetes mellitus with other specified complication: Secondary | ICD-10-CM

## 2020-07-06 LAB — BAYER DCA HB A1C WAIVED: HB A1C (BAYER DCA - WAIVED): 7.6 % — ABNORMAL HIGH (ref <7.0)

## 2020-07-07 LAB — CBC WITH DIFFERENTIAL/PLATELET
Basophils Absolute: 0 10*3/uL (ref 0.0–0.2)
Basos: 0 %
EOS (ABSOLUTE): 0.2 10*3/uL (ref 0.0–0.4)
Eos: 3 %
Hematocrit: 41 % (ref 37.5–51.0)
Hemoglobin: 14.2 g/dL (ref 13.0–17.7)
Immature Grans (Abs): 0.1 10*3/uL (ref 0.0–0.1)
Immature Granulocytes: 1 %
Lymphocytes Absolute: 2.2 10*3/uL (ref 0.7–3.1)
Lymphs: 30 %
MCH: 32.3 pg (ref 26.6–33.0)
MCHC: 34.6 g/dL (ref 31.5–35.7)
MCV: 93 fL (ref 79–97)
Monocytes Absolute: 0.7 10*3/uL (ref 0.1–0.9)
Monocytes: 10 %
Neutrophils Absolute: 4.1 10*3/uL (ref 1.4–7.0)
Neutrophils: 56 %
Platelets: 292 10*3/uL (ref 150–450)
RBC: 4.4 x10E6/uL (ref 4.14–5.80)
RDW: 12.8 % (ref 11.6–15.4)
WBC: 7.2 10*3/uL (ref 3.4–10.8)

## 2020-07-07 LAB — CMP14+EGFR
ALT: 40 IU/L (ref 0–44)
AST: 23 IU/L (ref 0–40)
Albumin/Globulin Ratio: 2 (ref 1.2–2.2)
Albumin: 4.6 g/dL (ref 3.8–4.9)
Alkaline Phosphatase: 76 IU/L (ref 48–121)
BUN/Creatinine Ratio: 14 (ref 9–20)
BUN: 12 mg/dL (ref 6–24)
Bilirubin Total: 0.4 mg/dL (ref 0.0–1.2)
CO2: 23 mmol/L (ref 20–29)
Calcium: 9.4 mg/dL (ref 8.7–10.2)
Chloride: 103 mmol/L (ref 96–106)
Creatinine, Ser: 0.83 mg/dL (ref 0.76–1.27)
GFR calc Af Amer: 116 mL/min/{1.73_m2} (ref >59)
GFR calc non Af Amer: 100 mL/min/{1.73_m2} (ref >59)
Globulin, Total: 2.3 g/dL (ref 1.5–4.5)
Glucose: 147 mg/dL — ABNORMAL HIGH (ref 65–99)
Potassium: 4.5 mmol/L (ref 3.5–5.2)
Sodium: 140 mmol/L (ref 134–144)
Total Protein: 6.9 g/dL (ref 6.0–8.5)

## 2020-07-07 LAB — LIPID PANEL
Chol/HDL Ratio: 3.7 ratio (ref 0.0–5.0)
Cholesterol, Total: 128 mg/dL (ref 100–199)
HDL: 35 mg/dL — ABNORMAL LOW (ref >39)
LDL Chol Calc (NIH): 37 mg/dL (ref 0–99)
Triglycerides: 385 mg/dL — ABNORMAL HIGH (ref 0–149)
VLDL Cholesterol Cal: 56 mg/dL — ABNORMAL HIGH (ref 5–40)

## 2020-07-07 LAB — TSH: TSH: 2.25 u[IU]/mL (ref 0.450–4.500)

## 2020-07-23 ENCOUNTER — Ambulatory Visit: Payer: No Typology Code available for payment source | Admitting: Internal Medicine

## 2020-07-23 ENCOUNTER — Other Ambulatory Visit: Payer: Self-pay

## 2020-07-23 ENCOUNTER — Encounter: Payer: Self-pay | Admitting: Internal Medicine

## 2020-07-23 DIAGNOSIS — G4733 Obstructive sleep apnea (adult) (pediatric): Secondary | ICD-10-CM | POA: Diagnosis not present

## 2020-07-23 NOTE — Assessment & Plan Note (Signed)
Benefits from CPAP with good compliance and control Plan- continue auto 10-20 

## 2020-07-23 NOTE — Assessment & Plan Note (Signed)
Encourage to set his mind to long term weight loss, which would help several medical problems.

## 2020-07-23 NOTE — Progress Notes (Signed)
HPI male nonsmoker followed for OSA, complicated by HBP, GERD, obesity, CAD/ stent Unattended home sleep study 01/05/15 AHI 77/ hr, desat to 73%, weight 253 lbs   -----------------------------------------------------------------------------------------.   06/02/2019- 54 year old male nonsmoker followed for OSA, complicated by HBP, GERD, obesity, CAD/ stent CPAP auto 8-20/ [facility]>> today change to auto 10-20 Download compliance 90%, AHI 1.2/hr Body weight today 225 lbs -----F/U for OSA on CPAP 8-20, DME: [facility], no complaints Wants pressure to start a little higher when he puts on mask.  Download reviewed.  Brother recently died met cancer. Denies acute medical issues. Arrival BP 150/90 - for attention of PCP.  07/23/20- 54 year old male nonsmoker followed for OSA, complicated by HBP, GERD, obesity, CAD/ stent, Hyperlipidemia, DM2, GERD, Morbid Obesity,  CPAP auto 10-20/ [facility] Download- compliance 93%, AHI 1.1/ hr Body weight today- 242 lbs Covid vax- none Flu vax- later -----Pt states he has been doing well since last visit. Pt states he wears his cpap between 6-8 hours each night and states he feels rested when he wakes up. DME: [facility]. Comfortable with CPAP. Download reviewed with him. Discussed his decision not to get vaccinated. Invited questions and recommended vaccination.   ROS-see HPI   + = positive Constitutional:   No-   weight loss, night sweats, fevers, chills, +fatigue, lassitude. HEENT:   No-  headaches, difficulty swallowing, tooth/dental problems, sore throat,       No-  sneezing, itching, ear ache, nasal congestion, post nasal drip,  CV:  No-   chest pain, orthopnea, PND, swelling in lower extremities, anasarca,                                                    dizziness, palpitations Resp: No-   shortness of breath with exertion or at rest.              No-   productive cough,  No non-productive cough,  No- coughing up of blood.              No-   change in color of  mucus.  No- wheezing.   Skin: No-   rash or lesions. GI:  +heartburn, indigestion, No-abdominal pain, nausea, vomiting,  GU:  MS:  No-   joint pain or swelling.   Neuro-     nothing unusual Psych:  No- change in mood or affect. No depression or anxiety.  No memory loss.  OBJ- Physical Exam     +obese General- Alert, Oriented, Affect-appropriate, Distress- none acute,  Skin- rash-none, lesions- none, excoriation- none Lymphadenopathy- none Head- atraumatic            Eyes- Gross vision intact, PERRLA, conjunctivae and secretions clear            Ears- +Hearing aid            Nose- Clear, no-Septal dev, mucus, polyps, erosion, perforation             Throat- Mallampati IV , mucosa clear , drainage- none, tonsils- atrophic, + hoarse Neck- flexible , trachea midline, no stridor , thyroid nl, carotid no bruit Chest - symmetrical excursion , unlabored           Heart/CV- RRR , no murmur , no gallop  , no rub, nl s1 s2                           -  JVD- none , edema- none, stasis changes- none, varices- none           Lung- clear to P&A, wheeze- none, cough- none , dullness-none, rub- none           Chest wall-  Abd-  Br/ Gen/ Rectal- Not done, not indicated Extrem- cyanosis- none, clubbing, none, atrophy- none, strength- nl Neuro- grossly intact to observation

## 2020-07-23 NOTE — Patient Instructions (Signed)
We can continue CPAP auto 10-20  I do strongly recommend you get Covid vaccine now- you can go to any [facility] or [facility].  I do recommend the flu shot this Fall  Please call if we can help or answer questions for you.

## 2020-08-02 MED FILL — JANUVIA 100 MG TABLET: 100 | 30 days supply | Qty: 30 | Fill #1

## 2020-08-02 MED FILL — FLUTICASONE PROP 50 MCG SPR: 50 | 30 days supply | Qty: 16 | Fill #2

## 2020-08-02 MED FILL — busPIRone HCL 15 MG TABS: 15 | 90 days supply | Qty: 180 | Fill #1

## 2020-08-22 ENCOUNTER — Other Ambulatory Visit: Payer: Self-pay | Admitting: Critical Care Medicine

## 2020-08-22 ENCOUNTER — Other Ambulatory Visit: Payer: No Typology Code available for payment source

## 2020-08-22 DIAGNOSIS — Z20822 Contact with and (suspected) exposure to covid-19: Secondary | ICD-10-CM

## 2020-08-23 LAB — NOVEL CORONAVIRUS, NAA: SARS-CoV-2, NAA: NOT DETECTED

## 2020-08-23 LAB — SARS-COV-2, NAA 2 DAY TAT

## 2020-08-24 MED FILL — METOPROLOL SUCCINATE ER 25: 25 | 90 days supply | Qty: 90 | Fill #0

## 2020-08-24 MED FILL — METFORMIN HCL 500 MG TABS: 500 | 90 days supply | Qty: 90 | Fill #0

## 2020-08-24 MED FILL — AMLODIPINE BESYLATE 5 MG TA: 5 | 90 days supply | Qty: 90 | Fill #0

## 2020-08-24 MED FILL — PANTOPRAZOLE SOD DR 40 MG T: 40 | 90 days supply | Qty: 90 | Fill #0

## 2020-09-14 MED FILL — JANUVIA 100 MG TABLET: 100 | 30 days supply | Qty: 30 | Fill #2

## 2020-10-09 ENCOUNTER — Other Ambulatory Visit: Payer: Self-pay | Admitting: *Deleted

## 2020-10-09 ENCOUNTER — Other Ambulatory Visit: Payer: Self-pay | Admitting: Family Medicine

## 2020-10-09 MED ORDER — LOSARTAN POTASSIUM 50 MG PO TABS: 50.0000 mg | ORAL_TABLET | Freq: Every day | ORAL | 3 refills | Status: DC

## 2020-10-09 MED FILL — LOSARTAN POTASSIUM 50 MG TA: 50 | 90 days supply | Qty: 90 | Fill #0

## 2020-10-09 MED FILL — JANUVIA 100 MG TABLET: 100 | 30 days supply | Qty: 30 | Fill #3

## 2020-10-11 ENCOUNTER — Encounter: Payer: Self-pay | Admitting: Family Medicine

## 2020-10-11 ENCOUNTER — Ambulatory Visit (INDEPENDENT_AMBULATORY_CARE_PROVIDER_SITE_OTHER): Payer: No Typology Code available for payment source | Admitting: Family Medicine

## 2020-10-11 ENCOUNTER — Other Ambulatory Visit: Payer: Self-pay

## 2020-10-11 ENCOUNTER — Ambulatory Visit (INDEPENDENT_AMBULATORY_CARE_PROVIDER_SITE_OTHER): Payer: No Typology Code available for payment source

## 2020-10-11 VITALS — BP 137/89 | HR 100 | Ht 68.0 in | Wt 241.0 lb

## 2020-10-11 DIAGNOSIS — E559 Vitamin D deficiency, unspecified: Secondary | ICD-10-CM

## 2020-10-11 DIAGNOSIS — I10 Essential (primary) hypertension: Secondary | ICD-10-CM

## 2020-10-11 DIAGNOSIS — E1169 Type 2 diabetes mellitus with other specified complication: Secondary | ICD-10-CM

## 2020-10-11 DIAGNOSIS — M549 Dorsalgia, unspecified: Secondary | ICD-10-CM

## 2020-10-11 DIAGNOSIS — E782 Mixed hyperlipidemia: Secondary | ICD-10-CM | POA: Diagnosis not present

## 2020-10-11 LAB — BAYER DCA HB A1C WAIVED: HB A1C (BAYER DCA - WAIVED): 7.3 % — ABNORMAL HIGH (ref <7.0)

## 2020-10-11 MED ORDER — KETOROLAC TROMETHAMINE 60 MG/2ML IM SOLN
60.0000 mg | Freq: Once | INTRAMUSCULAR | Status: AC
  Administered 2020-10-11: 60 mg via INTRAMUSCULAR

## 2020-10-11 NOTE — Progress Notes (Signed)
BP 137/89   Pulse 100   Ht '5\' 8"'  (1.727 m)   Wt 241 lb (109.3 kg)   SpO2 94%   BMI 36.64 kg/m    Subjective:   Patient ID: Jimmy Loretha Stapler., male    DOB: June 30, 1966, 54 y.o.   MRN: [id number]  HPI: Jimmy Jahir Halt. is a 54 y.o. male presenting on 10/11/2020 for No chief complaint on file.   HPI  Type 2 diabetes mellitus Patient comes in today for recheck of his diabetes. Patient has been currently taking Metformin and Januvia. Patient is currently on an ACE inhibitor/ARB. Patient has seen an ophthalmologist this year. Patient denies any issues with their feet. The symptom started onset as an adult hypertension hyperlipidemia ARE RELATED TO DM   Hyperlipidemia Patient is coming in for recheck of his hyperlipidemia. The patient is currently taking and Crestor. They deny any issues with myalgias or history of liver damage from it. They deny any focal numbness or weakness or chest pain.   Hypertension Patient is currently on amlodipine and losartan and metoprolol, and their blood pressure today is 137/89. Patient denies any lightheadedness or dizziness. Patient denies headaches, blurred vision, chest pains, shortness of breath, or weakness. Denies any side effects from medication and is content with current medication.   Patient is coming in with complaints of low back pain.  Is been bothering him for about 2 days.  He does not recall any specific incident that brought it on.  It does hurt more in the right lower back anymore.  He denies any numbness or weakness or pain going down either of his legs.  He wanted an x-ray to try to look at it.  He said he had a similar incident a few years ago and had an injection that improved it.  Relevant past medical, surgical, family and social history reviewed and updated as indicated. Interim medical history since our last visit reviewed. Allergies and medications reviewed and updated.  Review of Systems  Constitutional: Negative for  chills and fever.  Eyes: Negative for visual disturbance.  Respiratory: Negative for shortness of breath and wheezing.   Cardiovascular: Negative for chest pain and leg swelling.  Musculoskeletal: Positive for arthralgias and back pain. Negative for gait problem.  Skin: Negative for rash.  Neurological: Negative for dizziness, weakness and light-headedness.  All other systems reviewed and are negative.   Per HPI unless specifically indicated above   Allergies as of 10/11/2020   No Known Allergies     Medication List       Accurate as of October 11, 2020  4:48 PM. If you have any questions, ask your nurse or doctor.        amLODipine 5 MG tablet Commonly known as: NORVASC Take 1 tablet (5 mg total) by mouth daily. TAKE 1 TABLET BY MOUTH ONCE DAILY   aspirin EC 81 MG tablet Take 81 mg by mouth daily.   busPIRone 15 MG tablet Commonly known as: BUSPAR Take 1 tablet (15 mg total) by mouth 2 (two) times daily as needed.   colchicine 0.6 MG tablet Take 1.2 mg (2 tablets) by mouth once, then take 0.6 mg one hour later. Use as needed for gout flare. Do not repeat for at least 3 days.   fluticasone 50 MCG/ACT nasal spray Commonly known as: FLONASE Place 1 spray into both nostrils 2 (two) times daily as needed for allergies or rhinitis.   FreeStyle Freedom Lite w/Device Kit Check BS  once daily   freestyle lancets Stick once daily for glucose   glucose blood test strip Commonly known as: FREESTYLE TEST STRIPS E11.9  Test once daily   icosapent Ethyl 1 g capsule Commonly known as: Vascepa Take 2 capsules (2 g total) by mouth 2 (two) times daily. TAKE 2 CAPSULES BY MOUTH TWO TIMES DAILY   losartan 50 MG tablet Commonly known as: COZAAR Take 1 tablet (50 mg total) by mouth daily.   metFORMIN 500 MG tablet Commonly known as: GLUCOPHAGE Take 1 tablet (500 mg total) by mouth daily with breakfast. TAKE 1 TABLET BY MOUTH ONCE DAILY WITH BREAKFAST   metoprolol succinate  25 MG 24 hr tablet Commonly known as: TOPROL-XL Take 1 tablet (25 mg total) by mouth daily.   nitroGLYCERIN 0.4 MG SL tablet Commonly known as: NITROSTAT Place 1 tablet (0.4 mg total) under the tongue every 5 (five) minutes as needed for chest pain.   pantoprazole 40 MG tablet Commonly known as: Protonix Take 1 tablet (40 mg total) by mouth daily.   rosuvastatin 40 MG tablet Commonly known as: CRESTOR Take 1 tablet (40 mg total) by mouth at bedtime.   sitaGLIPtin 100 MG tablet Commonly known as: Januvia Take 1 tablet (100 mg total) by mouth daily.   VITAMIN D PO Take 5,000 mg by mouth.        Objective:   BP 137/89   Pulse 100   Ht '5\' 8"'  (1.727 m)   Wt 241 lb (109.3 kg)   SpO2 94%   BMI 36.64 kg/m   Wt Readings from Last 3 Encounters:  10/11/20 241 lb (109.3 kg)  07/23/20 242 lb 9.6 oz (110 kg)  04/09/20 241 lb (109.3 kg)    Physical Exam Vitals and nursing note reviewed.  Constitutional:      General: He is not in acute distress.    Appearance: He is well-developed and well-nourished. He is not diaphoretic.  Eyes:     General: No scleral icterus.    Extraocular Movements: EOM normal.     Conjunctiva/sclera: Conjunctivae normal.  Neck:     Thyroid: No thyromegaly.  Cardiovascular:     Rate and Rhythm: Normal rate and regular rhythm.     Pulses: Intact distal pulses.     Heart sounds: Normal heart sounds. No murmur heard.   Pulmonary:     Effort: Pulmonary effort is normal. No respiratory distress.     Breath sounds: Normal breath sounds. No wheezing.  Musculoskeletal:        General: Tenderness (lumbar muscle pain on left and right lower) present. No edema.     Cervical back: Neck supple.  Lymphadenopathy:     Cervical: No cervical adenopathy.  Skin:    General: Skin is warm and dry.     Findings: No rash.  Neurological:     Mental Status: He is alert and oriented to person, place, and time.     Coordination: Coordination normal.  Psychiatric:         Mood and Affect: Mood and affect normal.        Behavior: Behavior normal.     Lumbar Xr: no acute bony abnormalities, await final radiologist read  Assessment & Plan:   Problem List Items Addressed This Visit      Cardiovascular and Mediastinum   HTN (hypertension)   Relevant Orders   CBC with Differential/Platelet     Endocrine   Type 2 diabetes mellitus with other specified complication ([place])  Relevant Orders   CBC with Differential/Platelet   CMP14+EGFR   Bayer DCA Hb A1c Waived     Other   Hyperlipidemia   Relevant Orders   Lipid panel   Vitamin D deficiency - Primary   Relevant Orders   DG Lumbar Spine 2-3 Views   VITAMIN D 25 Hydroxy (Vit-D Deficiency, Fractures)    Other Visit Diagnoses    Back pain, unspecified back location, unspecified back pain laterality, unspecified chronicity       Relevant Medications   ketorolac (TORADOL) injection 60 mg (Completed) (Start on 10/11/2020  5:15 PM)   Other Relevant Orders   DG Lumbar Spine 2-3 Views      a1c 7.3  Follow up plan: Return in about 3 months (around 01/09/2021), or if symptoms worsen or fail to improve, for Diabetes..  Counseling provided for all of the vaccine components Orders Placed This Encounter  Procedures  . DG Lumbar Spine 2-3 Views    Caryl Pina, MD [facility] 10/11/2020, 4:48 PM

## 2020-10-12 LAB — LIPID PANEL
Chol/HDL Ratio: 4 ratio (ref 0.0–5.0)
Cholesterol, Total: 136 mg/dL (ref 100–199)
HDL: 34 mg/dL — ABNORMAL LOW (ref >39)
LDL Chol Calc (NIH): 32 mg/dL (ref 0–99)
Triglycerides: 501 mg/dL — ABNORMAL HIGH (ref 0–149)
VLDL Cholesterol Cal: 70 mg/dL — ABNORMAL HIGH (ref 5–40)

## 2020-10-12 LAB — CBC WITH DIFFERENTIAL/PLATELET
Basophils Absolute: 0 10*3/uL (ref 0.0–0.2)
Basos: 1 %
EOS (ABSOLUTE): 0.3 10*3/uL (ref 0.0–0.4)
Eos: 3 %
Hematocrit: 42.8 % (ref 37.5–51.0)
Hemoglobin: 14.4 g/dL (ref 13.0–17.7)
Immature Grans (Abs): 0.1 10*3/uL (ref 0.0–0.1)
Immature Granulocytes: 1 %
Lymphocytes Absolute: 2 10*3/uL (ref 0.7–3.1)
Lymphs: 27 %
MCH: 31.8 pg (ref 26.6–33.0)
MCHC: 33.6 g/dL (ref 31.5–35.7)
MCV: 95 fL (ref 79–97)
Monocytes Absolute: 1 10*3/uL — ABNORMAL HIGH (ref 0.1–0.9)
Monocytes: 13 %
Neutrophils Absolute: 4.2 10*3/uL (ref 1.4–7.0)
Neutrophils: 55 %
Platelets: 264 10*3/uL (ref 150–450)
RBC: 4.53 x10E6/uL (ref 4.14–5.80)
RDW: 12.8 % (ref 11.6–15.4)
WBC: 7.5 10*3/uL (ref 3.4–10.8)

## 2020-10-12 LAB — CMP14+EGFR
ALT: 30 IU/L (ref 0–44)
AST: 19 IU/L (ref 0–40)
Albumin/Globulin Ratio: 2.1 (ref 1.2–2.2)
Albumin: 4.4 g/dL (ref 3.8–4.9)
Alkaline Phosphatase: 68 IU/L (ref 44–121)
BUN/Creatinine Ratio: 20 (ref 9–20)
BUN: 19 mg/dL (ref 6–24)
Bilirubin Total: 0.5 mg/dL (ref 0.0–1.2)
CO2: 23 mmol/L (ref 20–29)
Calcium: 9.6 mg/dL (ref 8.7–10.2)
Chloride: 103 mmol/L (ref 96–106)
Creatinine, Ser: 0.96 mg/dL (ref 0.76–1.27)
GFR calc Af Amer: 104 mL/min/{1.73_m2} (ref >59)
GFR calc non Af Amer: 90 mL/min/{1.73_m2} (ref >59)
Globulin, Total: 2.1 g/dL (ref 1.5–4.5)
Glucose: 159 mg/dL — ABNORMAL HIGH (ref 65–99)
Potassium: 4.3 mmol/L (ref 3.5–5.2)
Sodium: 142 mmol/L (ref 134–144)
Total Protein: 6.5 g/dL (ref 6.0–8.5)

## 2020-10-12 LAB — VITAMIN D 25 HYDROXY (VIT D DEFICIENCY, FRACTURES): Vit D, 25-Hydroxy: 24.9 ng/mL — ABNORMAL LOW (ref 30.0–100.0)

## 2020-10-14 ENCOUNTER — Other Ambulatory Visit: Payer: Self-pay | Admitting: Family Medicine

## 2020-10-14 MED ORDER — FENOFIBRATE 48 MG PO TABS
48.0000 mg | ORAL_TABLET | Freq: Every day | ORAL | 3 refills | Status: DC
Start: 2020-10-14 — End: 2020-10-14

## 2020-10-15 MED FILL — FENOFIBRATE 48 MG TABS: 48 | 90 days supply | Qty: 90 | Fill #0

## 2020-10-15 NOTE — Progress Notes (Signed)
Wife aware

## 2020-10-18 ENCOUNTER — Other Ambulatory Visit: Payer: Self-pay | Admitting: *Deleted

## 2020-10-18 ENCOUNTER — Other Ambulatory Visit: Payer: Self-pay | Admitting: Family Medicine

## 2020-10-18 MED ORDER — ROSUVASTATIN CALCIUM 40 MG PO TABS: 40.0000 mg | ORAL_TABLET | Freq: Every day | ORAL | 2 refills | Status: DC

## 2020-10-18 MED FILL — ROSUVASTATIN CALCIUM 40 MG: 40 | 90 days supply | Qty: 90 | Fill #0

## 2020-10-24 ENCOUNTER — Other Ambulatory Visit: Payer: No Typology Code available for payment source

## 2020-10-24 DIAGNOSIS — Z20822 Contact with and (suspected) exposure to covid-19: Secondary | ICD-10-CM

## 2020-10-27 LAB — NOVEL CORONAVIRUS, NAA

## 2020-11-21 MED FILL — METOPROLOL SUCCINATE ER 25: 25 | 90 days supply | Qty: 90 | Fill #1

## 2020-11-21 MED FILL — JANUVIA 100 MG TABLET: 100 | 30 days supply | Qty: 30 | Fill #4

## 2020-11-21 MED FILL — METFORMIN HCL 500 MG TABS: 500 | 90 days supply | Qty: 90 | Fill #1

## 2020-11-21 MED FILL — PANTOPRAZOLE SOD DR 40 MG T: 40 | 90 days supply | Qty: 90 | Fill #1

## 2020-11-21 MED FILL — AMLODIPINE BESYLATE 5 MG TA: 5 | 90 days supply | Qty: 90 | Fill #1

## 2020-11-26 ENCOUNTER — Encounter: Payer: Self-pay | Admitting: Family Medicine

## 2020-11-26 DIAGNOSIS — M109 Gout, unspecified: Secondary | ICD-10-CM

## 2020-11-27 ENCOUNTER — Other Ambulatory Visit: Payer: Self-pay | Admitting: Family Medicine

## 2020-11-27 MED ORDER — COLCHICINE 0.6 MG PO TABS: ORAL_TABLET | ORAL | 2 refills | Status: DC

## 2020-11-27 MED FILL — COLCHICINE 0.6 MG TABS: 0.6 | 3 days supply | Qty: 9 | Fill #0

## 2020-12-10 ENCOUNTER — Other Ambulatory Visit: Payer: Self-pay

## 2020-12-10 ENCOUNTER — Other Ambulatory Visit: Payer: Self-pay | Admitting: Family Medicine

## 2020-12-10 MED ORDER — FREESTYLE FREEDOM LITE W/DEVICE KIT: PACK | 0 refills | Status: DC

## 2020-12-10 MED ORDER — FREESTYLE TEST VI STRP: ORAL_STRIP | 12 refills | Status: DC

## 2020-12-10 MED ORDER — FREESTYLE LANCETS MISC: 12 refills | Status: DC

## 2020-12-10 MED FILL — FREESTYLE LITE TEST STRIP: 50 days supply | Qty: 50 | Fill #0

## 2020-12-10 MED FILL — FREESTYLE LITE METER: W/DEVICE | 30 days supply | Qty: 1 | Fill #0

## 2020-12-10 MED FILL — FREESTYLE LANCETS: 90 days supply | Qty: 100 | Fill #0

## 2020-12-25 MED FILL — busPIRone HCL 15 MG TABS: 15 | 90 days supply | Qty: 180 | Fill #2

## 2020-12-25 MED FILL — JANUVIA 100 MG TABLET: 100 | 30 days supply | Qty: 30 | Fill #5

## 2021-01-01 ENCOUNTER — Ambulatory Visit (INDEPENDENT_AMBULATORY_CARE_PROVIDER_SITE_OTHER): Payer: No Typology Code available for payment source | Admitting: Family Medicine

## 2021-01-01 ENCOUNTER — Other Ambulatory Visit: Payer: Self-pay

## 2021-01-01 ENCOUNTER — Encounter: Payer: Self-pay | Admitting: Family Medicine

## 2021-01-01 ENCOUNTER — Other Ambulatory Visit (HOSPITAL_BASED_OUTPATIENT_CLINIC_OR_DEPARTMENT_OTHER): Payer: Self-pay | Admitting: Family Medicine

## 2021-01-01 VITALS — BP 154/86 | HR 65 | Temp 97.6°F | Ht 68.0 in | Wt 244.4 lb

## 2021-01-01 DIAGNOSIS — M545 Low back pain, unspecified: Secondary | ICD-10-CM

## 2021-01-01 LAB — URINALYSIS, COMPLETE
Bilirubin, UA: NEGATIVE
Glucose, UA: NEGATIVE
Ketones, UA: NEGATIVE
Leukocytes,UA: NEGATIVE
Nitrite, UA: NEGATIVE
Protein,UA: NEGATIVE
RBC, UA: NEGATIVE
Specific Gravity, UA: 1.015 (ref 1.005–1.030)
Urobilinogen, Ur: 0.2 mg/dL (ref 0.2–1.0)
pH, UA: 7.5 (ref 5.0–7.5)

## 2021-01-01 LAB — MICROSCOPIC EXAMINATION
Epithelial Cells (non renal): NONE SEEN /hpf (ref 0–10)
RBC, Urine: NONE SEEN /hpf (ref 0–2)
WBC, UA: NONE SEEN /hpf (ref 0–5)

## 2021-01-01 MED ORDER — MELOXICAM 7.5 MG PO TABS
7.5000 mg | ORAL_TABLET | Freq: Every day | ORAL | 2 refills | Status: DC | PRN
Start: 2021-01-01 — End: 2021-01-01

## 2021-01-01 MED ORDER — PREDNISONE 10 MG (21) PO TBPK: ORAL_TABLET | ORAL | 0 refills | Status: DC

## 2021-01-01 MED FILL — MELOXICAM 7.5 MG TABLET: 7.5 | 30 days supply | Qty: 30 | Fill #0

## 2021-01-01 MED FILL — predniSONE 10 MG TABS: 10 | 6 days supply | Qty: 21 | Fill #0

## 2021-01-01 NOTE — Patient Instructions (Addendum)
Lidoderm patches Muscle rubs - Biofreeze Ice, then heat   Low Back Sprain or Strain Rehab Ask your health care provider which exercises are safe for you. Do exercises exactly as told by your health care provider and adjust them as directed. It is normal to feel mild stretching, pulling, tightness, or discomfort as you do these exercises. Stop right away if you feel sudden pain or your pain gets worse. Do not begin these exercises until told by your health care provider. Stretching and range-of-motion exercises These exercises warm up your muscles and joints and improve the movement and flexibility of your back. These exercises also help to relieve pain, numbness, and tingling. Lumbar rotation 1. Lie on your back on a firm surface and bend your knees. 2. Straighten your arms out to your sides so each arm forms a 90-degree angle (right angle) with a side of your body. 3. Slowly move (rotate) both of your knees to one side of your body until you feel a stretch in your lower back (lumbar). Try not to let your shoulders lift off the floor. 4. Hold this position for __________ seconds. 5. Tense your abdominal muscles and slowly move your knees back to the starting position. 6. Repeat this exercise on the other side of your body. Repeat __________ times. Complete this exercise __________ times a day.   Single knee to chest 1. Lie on your back on a firm surface with both legs straight. 2. Bend one of your knees. Use your hands to move your knee up toward your chest until you feel a gentle stretch in your lower back and buttock. ? Hold your leg in this position by holding on to the front of your knee. ? Keep your other leg as straight as possible. 3. Hold this position for __________ seconds. 4. Slowly return to the starting position. 5. Repeat with your other leg. Repeat __________ times. Complete this exercise __________ times a day.   Prone extension on elbows 1. Lie on your abdomen on a firm  surface (prone position). 2. Prop yourself up on your elbows. 3. Use your arms to help lift your chest up until you feel a gentle stretch in your abdomen and your lower back. ? This will place some of your body weight on your elbows. If this is uncomfortable, try stacking pillows under your chest. ? Your hips should stay down, against the surface that you are lying on. Keep your hip and back muscles relaxed. 4. Hold this position for __________ seconds. 5. Slowly relax your upper body and return to the starting position. Repeat __________ times. Complete this exercise __________ times a day.   Strengthening exercises These exercises build strength and endurance in your back. Endurance is the ability to use your muscles for a long time, even after they get tired. Pelvic tilt This exercise strengthens the muscles that lie deep in the abdomen. 1. Lie on your back on a firm surface. Bend your knees and keep your feet flat on the floor. 2. Tense your abdominal muscles. Tip your pelvis up toward the ceiling and flatten your lower back into the floor. ? To help with this exercise, you may place a small towel under your lower back and try to push your back into the towel. 3. Hold this position for __________ seconds. 4. Let your muscles relax completely before you repeat this exercise. Repeat __________ times. Complete this exercise __________ times a day. Alternating arm and leg raises 1. Get on your hands and  knees on a firm surface. If you are on a hard floor, you may want to use padding, such as an exercise mat, to cushion your knees. 2. Line up your arms and legs. Your hands should be directly below your shoulders, and your knees should be directly below your hips. 3. Lift your left leg behind you. At the same time, raise your right arm and straighten it in front of you. ? Do not lift your leg higher than your hip. ? Do not lift your arm higher than your shoulder. ? Keep your abdominal and back  muscles tight. ? Keep your hips facing the ground. ? Do not arch your back. ? Keep your balance carefully, and do not hold your breath. 4. Hold this position for __________ seconds. 5. Slowly return to the starting position. 6. Repeat with your right leg and your left arm. Repeat __________ times. Complete this exercise __________ times a day.   Abdominal set with straight leg raise 1. Lie on your back on a firm surface. 2. Bend one of your knees and keep your other leg straight. 3. Tense your abdominal muscles and lift your straight leg up, 4-6 inches (10-15 cm) off the ground. 4. Keep your abdominal muscles tight and hold this position for __________ seconds. ? Do not hold your breath. ? Do not arch your back. Keep it flat against the ground. 5. Keep your abdominal muscles tense as you slowly lower your leg back to the starting position. 6. Repeat with your other leg. Repeat __________ times. Complete this exercise __________ times a day.   Single leg lower with bent knees 1. Lie on your back on a firm surface. 2. Tense your abdominal muscles and lift your feet off the floor, one foot at a time, so your knees and hips are bent in 90-degree angles (right angles). ? Your knees should be over your hips and your lower legs should be parallel to the floor. 3. Keeping your abdominal muscles tense and your knee bent, slowly lower one of your legs so your toe touches the ground. 4. Lift your leg back up to return to the starting position. ? Do not hold your breath. ? Do not let your back arch. Keep your back flat against the ground. 5. Repeat with your other leg. Repeat __________ times. Complete this exercise __________ times a day. Posture and body mechanics Good posture and healthy body mechanics can help to relieve stress in your body's tissues and joints. Body mechanics refers to the movements and positions of your body while you do your daily activities. Posture is part of body  mechanics. Good posture means:  Your spine is in its natural S-curve position (neutral).  Your shoulders are pulled back slightly.  Your head is not tipped forward. Follow these guidelines to improve your posture and body mechanics in your everyday activities. Standing  When standing, keep your spine neutral and your feet about hip width apart. Keep a slight bend in your knees. Your ears, shoulders, and hips should line up.  When you do a task in which you stand in one place for a long time, place one foot up on a stable object that is 2-4 inches (5-10 cm) high, such as a footstool. This helps keep your spine neutral.   Sitting  When sitting, keep your spine neutral and keep your feet flat on the floor. Use a footrest, if necessary, and keep your thighs parallel to the floor. Avoid rounding your shoulders, and avoid  tilting your head forward.  When working at a desk or a computer, keep your desk at a height where your hands are slightly lower than your elbows. Slide your chair under your desk so you are close enough to maintain good posture.  When working at a computer, place your monitor at a height where you are looking straight ahead and you do not have to tilt your head forward or downward to look at the screen.   Resting  When lying down and resting, avoid positions that are most painful for you.  If you have pain with activities such as sitting, bending, stooping, or squatting, lie in a position in which your body does not bend very much. For example, avoid curling up on your side with your arms and knees near your chest (fetal position).  If you have pain with activities such as standing for a long time or reaching with your arms, lie with your spine in a neutral position and bend your knees slightly. Try the following positions: ? Lying on your side with a pillow between your knees. ? Lying on your back with a pillow under your knees. Lifting  When lifting objects, keep your  feet at least shoulder width apart and tighten your abdominal muscles.  Bend your knees and hips and keep your spine neutral. It is important to lift using the strength of your legs, not your back. Do not lock your knees straight out.  Always ask for help to lift heavy or awkward objects.   This information is not intended to replace advice given to you by your health care provider. Make sure you discuss any questions you have with your health care provider. Document Revised: 02/04/2019 Document Reviewed: 11/04/2018 Elsevier Patient Education  [place].

## 2021-01-02 ENCOUNTER — Encounter: Payer: Self-pay | Admitting: Family Medicine

## 2021-01-02 NOTE — Progress Notes (Signed)
Assessment & Plan:  1. Acute left-sided low back pain without sciatica Education provided on low back exercises. Encouraged Lidoderm patches, Biofreeze, and Ice/Heat. Rx'd prednisone taper and meloxicam.  - Urinalysis, Complete - negative. - meloxicam (MOBIC) 7.5 MG tablet; Take 1 tablet (7.5 mg total) by mouth daily as needed for pain.  Dispense: 30 tablet; Refill: 2 - predniSONE (STERAPRED UNI-PAK 21 TAB) 10 MG (21) TBPK tablet; As directed x 6 days  Dispense: 21 tablet; Refill: 0   Follow up plan: Return if symptoms worsen or fail to improve.  Hendricks Limes, MSN, APRN, FNP-C [facility]  Subjective:   Patient ID: [name], male    DOB: December 18, 1965, 55 y.o.   MRN: [id number]  HPI: Deane Jayren Cease. is a 55 y.o. male presenting on 01/01/2021 for Back Pain (Patient states it has been ongoing x 3-6 months but has gotten worse/)  Patient reports back pain for the past 3-6 months that has gotten worse. The pain is located on the left side. It does not radiate. He describes the pain as shooting/piercing when he puts his weight through his left leg. He rates his pain 6/10 today. States the pain is normally 1-2/10; for the past month it has been 2-4/10; and since yesterday it has been 4-7/10. He has been moving furniture recently for his wife and daughter. He feels walking makes the pain better. He has tried a roll on muscle rub and Tylenol which are a little helpful. He has a history of kidney stones.    ROS: Negative unless specifically indicated above in HPI.   Relevant past medical history reviewed and updated as indicated.   Allergies and medications reviewed and updated.   Current Outpatient Medications:  .  amLODipine (NORVASC) 5 MG tablet, Take 1 tablet (5 mg total) by mouth daily. TAKE 1 TABLET BY MOUTH ONCE DAILY, Disp: 90 tablet, Rfl: 3 .  aspirin EC 81 MG tablet, Take 81 mg by mouth daily., Disp: , Rfl:  .  Biotin 10000 MCG TBDP, Take by  mouth daily., Disp: , Rfl:  .  Blood Glucose Monitoring Suppl (FREESTYLE FREEDOM LITE) w/Device KIT, Check BS once daily, Disp: 1 kit, Rfl: 0 .  busPIRone (BUSPAR) 15 MG tablet, Take 1 tablet (15 mg total) by mouth 2 (two) times daily as needed., Disp: 180 tablet, Rfl: 3 .  colchicine 0.6 MG tablet, Take 1.2 mg (2 tablets) by mouth once, then take 0.6 mg one hour later. Use as needed for gout flare. Do not repeat for at least 3 days., Disp: 9 tablet, Rfl: 2 .  fenofibrate (TRICOR) 48 MG tablet, Take 1 tablet (48 mg total) by mouth daily., Disp: 90 tablet, Rfl: 3 .  fluticasone (FLONASE) 50 MCG/ACT nasal spray, Place 1 spray into both nostrils 2 (two) times daily as needed for allergies or rhinitis., Disp: 16 g, Rfl: 6 .  glucose blood (FREESTYLE TEST STRIPS) test strip, E11.9  Test once daily, Disp: 100 each, Rfl: 12 .  icosapent Ethyl (VASCEPA) 1 g capsule, Take 2 capsules (2 g total) by mouth 2 (two) times daily. TAKE 2 CAPSULES BY MOUTH TWO TIMES DAILY, Disp: 360 capsule, Rfl: 3 .  Lancets (FREESTYLE) lancets, Stick once daily for glucose, Disp: 100 each, Rfl: 12 .  losartan (COZAAR) 50 MG tablet, Take 1 tablet (50 mg total) by mouth daily., Disp: 90 tablet, Rfl: 3 .  Magnesium 400 MG CAPS, Take by mouth daily., Disp: , Rfl:  .  meloxicam (MOBIC) 7.5 MG tablet, Take 1 tablet (7.5 mg total) by mouth daily as needed for pain., Disp: 30 tablet, Rfl: 2 .  metFORMIN (GLUCOPHAGE) 500 MG tablet, Take 1 tablet (500 mg total) by mouth daily with breakfast. TAKE 1 TABLET BY MOUTH ONCE DAILY WITH BREAKFAST, Disp: 90 tablet, Rfl: 3 .  metoprolol succinate (TOPROL-XL) 25 MG 24 hr tablet, Take 1 tablet (25 mg total) by mouth daily., Disp: 90 tablet, Rfl: 3 .  pantoprazole (PROTONIX) 40 MG tablet, Take 1 tablet (40 mg total) by mouth daily., Disp: 90 tablet, Rfl: 3 .  predniSONE (STERAPRED UNI-PAK 21 TAB) 10 MG (21) TBPK tablet, As directed x 6 days, Disp: 21 tablet, Rfl: 0 .  rosuvastatin (CRESTOR) 40 MG  tablet, Take 1 tablet (40 mg total) by mouth at bedtime., Disp: 90 tablet, Rfl: 2 .  sitaGLIPtin (JANUVIA) 100 MG tablet, Take 1 tablet (100 mg total) by mouth daily., Disp: 90 tablet, Rfl: 3 .  VITAMIN D PO, Take 5,000 mg by mouth., Disp: , Rfl:  .  Vitamin D, Cholecalciferol, 10 MCG (400 UNIT) CAPS, Take by mouth daily., Disp: , Rfl:  .  Zinc Sulfate (ZINC 15 PO), Take by mouth daily., Disp: , Rfl:  .  nitroGLYCERIN (NITROSTAT) 0.4 MG SL tablet, Place 1 tablet (0.4 mg total) under the tongue every 5 (five) minutes as needed for chest pain. (Patient not taking: Reported on 04/09/2020), Disp: 25 tablet, Rfl: 1  No Known Allergies  Objective:   BP (!) 154/86   Pulse 65   Temp 97.6 F (36.4 C) (Temporal)   Ht '5\' 8"'  (1.727 m)   Wt 244 lb 6.4 oz (110.9 kg)   SpO2 95%   BMI 37.16 kg/m    Physical Exam Vitals reviewed.  Constitutional:      General: He is not in acute distress.    Appearance: Normal appearance. He is not ill-appearing, toxic-appearing or diaphoretic.  HENT:     Head: Normocephalic and atraumatic.  Eyes:     General: No scleral icterus.       Right eye: No discharge.        Left eye: No discharge.     Conjunctiva/sclera: Conjunctivae normal.  Cardiovascular:     Rate and Rhythm: Normal rate.  Pulmonary:     Effort: Pulmonary effort is normal. No respiratory distress.  Musculoskeletal:        General: Normal range of motion.     Cervical back: Normal range of motion.     Lumbar back: Tenderness present. No swelling, edema, deformity, signs of trauma, lacerations, spasms or bony tenderness. Normal range of motion. No scoliosis.       Back:  Skin:    General: Skin is warm and dry.  Neurological:     Mental Status: He is alert and oriented to person, place, and time. Mental status is at baseline.  Psychiatric:        Mood and Affect: Mood normal.        Behavior: Behavior normal.        Thought Content: Thought content normal.        Judgment: Judgment normal.

## 2021-01-16 MED FILL — LOSARTAN POTASSIUM 50 MG TA: 50 | 90 days supply | Qty: 90 | Fill #1

## 2021-01-16 MED FILL — ROSUVASTATIN CALCIUM 40 MG: 40 | 90 days supply | Qty: 90 | Fill #1

## 2021-01-16 MED FILL — VASCEPA 1 GM CAPSULE: 1 | 90 days supply | Qty: 360 | Fill #1

## 2021-01-21 ENCOUNTER — Encounter: Payer: Self-pay | Admitting: Internal Medicine

## 2021-01-26 ENCOUNTER — Other Ambulatory Visit (HOSPITAL_BASED_OUTPATIENT_CLINIC_OR_DEPARTMENT_OTHER): Payer: Self-pay

## 2021-01-30 ENCOUNTER — Other Ambulatory Visit (HOSPITAL_BASED_OUTPATIENT_CLINIC_OR_DEPARTMENT_OTHER): Payer: Self-pay

## 2021-01-30 MED FILL — Sitagliptin Phosphate Tab 100 MG (Base Equiv): ORAL | 30 days supply | Qty: 30 | Fill #0 | Status: AC

## 2021-01-31 ENCOUNTER — Ambulatory Visit: Payer: No Typology Code available for payment source | Admitting: Family Medicine

## 2021-02-07 ENCOUNTER — Encounter: Payer: Self-pay | Admitting: Family Medicine

## 2021-02-07 ENCOUNTER — Other Ambulatory Visit: Payer: Self-pay

## 2021-02-07 ENCOUNTER — Ambulatory Visit (INDEPENDENT_AMBULATORY_CARE_PROVIDER_SITE_OTHER): Payer: No Typology Code available for payment source | Admitting: Family Medicine

## 2021-02-07 VITALS — BP 146/89 | HR 78 | Ht 68.0 in | Wt 241.0 lb

## 2021-02-07 DIAGNOSIS — I10 Essential (primary) hypertension: Secondary | ICD-10-CM | POA: Diagnosis not present

## 2021-02-07 DIAGNOSIS — E1169 Type 2 diabetes mellitus with other specified complication: Secondary | ICD-10-CM

## 2021-02-07 DIAGNOSIS — E782 Mixed hyperlipidemia: Secondary | ICD-10-CM

## 2021-02-07 LAB — BAYER DCA HB A1C WAIVED: HB A1C (BAYER DCA - WAIVED): 8.4 % — ABNORMAL HIGH (ref <7.0)

## 2021-02-07 NOTE — Progress Notes (Signed)
BP (!) 146/89   Pulse 78   Ht 5' 8" (1.727 m)   Wt 241 lb (109.3 kg)   SpO2 94%   BMI 36.64 kg/m    Subjective:   Patient ID: Jimmy Loretha Stapler., male    DOB: 1965/11/06, 55 y.o.   MRN: [id number]  HPI: Jimmy Enmanuel Zufall. is a 55 y.o. male presenting on 02/07/2021 for Medical Management of Chronic Issues, Diabetes, Hyperlipidemia, and Hypertension   HPI Type 2 diabetes mellitus Patient comes in today for recheck of his diabetes. Patient has been currently taking Metformin and Januvia. Patient is currently on an ACE inhibitor/ARB. Patient has not seen an ophthalmologist this year. Patient denies any issues with their feet. The symptom started onset as an adult hypertension hyperlipidemia ARE RELATED TO DM   Hypertension Patient is currently on metoprolol and losartan and amlodipine, and their blood pressure today is 146/89 and 121/79 on recheck.. Patient denies any lightheadedness or dizziness. Patient denies headaches, blurred vision, chest pains, shortness of breath, or weakness. Denies any side effects from medication and is content with current medication.   Hyperlipidemia Patient is coming in for recheck of his hyperlipidemia. The patient is currently taking Vascepa and TriCor and Crestor. They deny any issues with myalgias or history of liver damage from it. They deny any focal numbness or weakness or chest pain.   Relevant past medical, surgical, family and social history reviewed and updated as indicated. Interim medical history since our last visit reviewed. Allergies and medications reviewed and updated.  Review of Systems  Constitutional: Negative for chills and fever.  Eyes: Negative for visual disturbance.  Respiratory: Negative for shortness of breath and wheezing.   Cardiovascular: Negative for chest pain and leg swelling.  Musculoskeletal: Negative for back pain and gait problem.  Skin: Negative for rash.  Neurological: Negative for dizziness, weakness and  light-headedness.  All other systems reviewed and are negative.   Per HPI unless specifically indicated above   Allergies as of 02/07/2021   No Known Allergies     Medication List       Accurate as of February 07, 2021  4:04 PM. If you have any questions, ask your nurse or doctor.        amLODipine 5 MG tablet Commonly known as: NORVASC TAKE 1 TABLET BY MOUTH ONCE DAILY   aspirin EC 81 MG tablet Take 81 mg by mouth daily.   Biotin 10000 MCG Tbdp Take by mouth daily.   colchicine 0.6 MG tablet TAKE 1.2 MG (2 TABLETS) BY MOUTH ONCE, THEN TAKE 0.6 MG ONE HOUR LATER. USE AS NEEDED FOR GOUT FLARE. DO NOT REPEAT FOR AT LEAST 3 DAYS.   fenofibrate 48 MG tablet Commonly known as: TRICOR TAKE 1 TABLET (48 MG TOTAL) BY MOUTH DAILY.   fluticasone 50 MCG/ACT nasal spray Commonly known as: FLONASE Place 1 spray into both nostrils 2 (two) times daily as needed for allergies or rhinitis.   FreeStyle Freedom Lite w/Device Kit Check BS once daily   FreeStyle Lite w/Device Kit CHECK BLOOD SUGAR ONCE DAILY   freestyle lancets CHECK BLOOD SUGAR ONCE DAILY   FREESTYLE LITE test strip Generic drug: glucose blood CHECK BLOOD SUGAR ONCE DAILY   losartan 50 MG tablet Commonly known as: COZAAR TAKE 1 TABLET (50 MG TOTAL) BY MOUTH DAILY.   Magnesium 400 MG Caps Take by mouth daily.   meloxicam 7.5 MG tablet Commonly known as: MOBIC TAKE 1 TABLET (7.5 MG TOTAL) BY  MOUTH DAILY AS NEEDED FOR PAIN.   metFORMIN 500 MG tablet Commonly known as: GLUCOPHAGE TAKE 1 TABLET BY MOUTH DAILY WITH BREAKFAST   metoprolol succinate 25 MG 24 hr tablet Commonly known as: TOPROL-XL TAKE 1 TABLET BY MOUTH ONCE DAILY   nitroGLYCERIN 0.4 MG SL tablet Commonly known as: NITROSTAT Place 1 tablet (0.4 mg total) under the tongue every 5 (five) minutes as needed for chest pain.   pantoprazole 40 MG tablet Commonly known as: PROTONIX TAKE 1 TABLET BY MOUTH ONCE DAILY   predniSONE 10 MG (21) Tbpk  tablet Commonly known as: STERAPRED UNI-PAK 21 TAB As directed x 6 days   predniSONE 10 MG tablet Commonly known as: DELTASONE TAKE 6 TABS BY MOUTH ON DAY 1; 5 TABS ON DAY 2; 4 TABS ON DAY 3; 3 TABS ON DAY 4; 2 TABS ON DAY 5; 1 TAB ON DAY 6 THEN STOP   rosuvastatin 40 MG tablet Commonly known as: CRESTOR TAKE 1 TABLET (40 MG TOTAL) BY MOUTH AT BEDTIME.   Januvia 100 MG tablet Generic drug: sitaGLIPtin TAKE 1 TABLET BY MOUTH ONCE DAILY   sitaGLIPtin 100 MG tablet Commonly known as: Januvia Take 1 tablet (100 mg total) by mouth daily.   Vascepa 1 g capsule Generic drug: icosapent Ethyl TAKE 2 CAPSULES BY MOUTH TWO TIMES DAILY **OFFICE VISIT NEEDED FOR FURTHER REFILLS**   Vitamin D (Cholecalciferol) 10 MCG (400 UNIT) Caps Take by mouth daily.   VITAMIN D PO Take 5,000 mg by mouth.   ZINC 15 PO Take by mouth daily.        Objective:   BP (!) 146/89   Pulse 78   Ht 5' 8" (1.727 m)   Wt 241 lb (109.3 kg)   SpO2 94%   BMI 36.64 kg/m   Wt Readings from Last 3 Encounters:  02/07/21 241 lb (109.3 kg)  01/01/21 244 lb 6.4 oz (110.9 kg)  10/11/20 241 lb (109.3 kg)    Physical Exam Vitals and nursing note reviewed.  Constitutional:      General: He is not in acute distress.    Appearance: He is well-developed. He is not diaphoretic.  Eyes:     General: No scleral icterus.    Conjunctiva/sclera: Conjunctivae normal.  Neck:     Thyroid: No thyromegaly.  Cardiovascular:     Rate and Rhythm: Normal rate and regular rhythm.     Heart sounds: Normal heart sounds. No murmur heard.   Pulmonary:     Effort: Pulmonary effort is normal. No respiratory distress.     Breath sounds: Normal breath sounds. No wheezing.  Musculoskeletal:        General: Normal range of motion.     Cervical back: Neck supple.  Lymphadenopathy:     Cervical: No cervical adenopathy.  Skin:    General: Skin is warm and dry.     Findings: No rash.  Neurological:     Mental Status: He is  alert and oriented to person, place, and time.     Coordination: Coordination normal.  Psychiatric:        Behavior: Behavior normal.       Assessment & Plan:   Problem List Items Addressed This Visit      Cardiovascular and Mediastinum   HTN (hypertension)   Relevant Orders   CBC with Differential/Platelet   CMP14+EGFR   Lipid panel   Bayer DCA Hb A1c Waived     Endocrine   Type 2 diabetes mellitus with   other specified complication (HCC) - Primary   Relevant Orders   CBC with Differential/Platelet   CMP14+EGFR   Lipid panel   Bayer DCA Hb A1c Waived     Other   Hyperlipidemia   Relevant Orders   CBC with Differential/Platelet   CMP14+EGFR   Lipid panel   Bayer DCA Hb A1c Waived      A1c is up at 8.4, did have some steroids during the last 3 months.  This likely affected his numbers.  Patient wants to try diet and exercise and refocus and come back and see where it is in 3 months. Follow up plan: Return in about 3 months (around 05/09/2021), or if symptoms worsen or fail to improve, for Diabetes hypertension cholesterol.  Counseling provided for all of the vaccine components No orders of the defined types were placed in this encounter.   Joshua Dettinger, MD [facility] 02/07/2021, 4:04 PM     

## 2021-02-08 LAB — CBC WITH DIFFERENTIAL/PLATELET
Basophils Absolute: 0.1 10*3/uL (ref 0.0–0.2)
Basos: 1 %
EOS (ABSOLUTE): 0.2 10*3/uL (ref 0.0–0.4)
Eos: 3 %
Hematocrit: 41.9 % (ref 37.5–51.0)
Hemoglobin: 14.6 g/dL (ref 13.0–17.7)
Immature Grans (Abs): 0 10*3/uL (ref 0.0–0.1)
Immature Granulocytes: 0 %
Lymphocytes Absolute: 1.6 10*3/uL (ref 0.7–3.1)
Lymphs: 29 %
MCH: 32.1 pg (ref 26.6–33.0)
MCHC: 34.8 g/dL (ref 31.5–35.7)
MCV: 92 fL (ref 79–97)
Monocytes Absolute: 0.7 10*3/uL (ref 0.1–0.9)
Monocytes: 12 %
Neutrophils Absolute: 3 10*3/uL (ref 1.4–7.0)
Neutrophils: 55 %
Platelets: 251 10*3/uL (ref 150–450)
RBC: 4.55 x10E6/uL (ref 4.14–5.80)
RDW: 12.9 % (ref 11.6–15.4)
WBC: 5.6 10*3/uL (ref 3.4–10.8)

## 2021-02-08 LAB — CMP14+EGFR
ALT: 30 IU/L (ref 0–44)
AST: 13 IU/L (ref 0–40)
Albumin/Globulin Ratio: 2.1 (ref 1.2–2.2)
Albumin: 4.5 g/dL (ref 3.8–4.9)
Alkaline Phosphatase: 67 IU/L (ref 44–121)
BUN/Creatinine Ratio: 19 (ref 9–20)
BUN: 15 mg/dL (ref 6–24)
Bilirubin Total: 0.4 mg/dL (ref 0.0–1.2)
CO2: 19 mmol/L — ABNORMAL LOW (ref 20–29)
Calcium: 9.4 mg/dL (ref 8.7–10.2)
Chloride: 101 mmol/L (ref 96–106)
Creatinine, Ser: 0.79 mg/dL (ref 0.76–1.27)
Globulin, Total: 2.1 g/dL (ref 1.5–4.5)
Glucose: 220 mg/dL — ABNORMAL HIGH (ref 65–99)
Potassium: 4.3 mmol/L (ref 3.5–5.2)
Sodium: 140 mmol/L (ref 134–144)
Total Protein: 6.6 g/dL (ref 6.0–8.5)
eGFR: 106 mL/min/{1.73_m2} (ref >59)

## 2021-02-08 LAB — LIPID PANEL
Chol/HDL Ratio: 4 ratio (ref 0.0–5.0)
Cholesterol, Total: 127 mg/dL (ref 100–199)
HDL: 32 mg/dL — ABNORMAL LOW (ref >39)
LDL Chol Calc (NIH): 28 mg/dL (ref 0–99)
Triglycerides: 486 mg/dL — ABNORMAL HIGH (ref 0–149)
VLDL Cholesterol Cal: 67 mg/dL — ABNORMAL HIGH (ref 5–40)

## 2021-02-11 ENCOUNTER — Encounter: Payer: Self-pay | Admitting: Family Medicine

## 2021-02-12 ENCOUNTER — Other Ambulatory Visit (HOSPITAL_BASED_OUTPATIENT_CLINIC_OR_DEPARTMENT_OTHER): Payer: Self-pay

## 2021-02-12 ENCOUNTER — Ambulatory Visit (INDEPENDENT_AMBULATORY_CARE_PROVIDER_SITE_OTHER): Payer: No Typology Code available for payment source | Admitting: Family

## 2021-02-12 ENCOUNTER — Encounter: Payer: Self-pay | Admitting: Family

## 2021-02-12 DIAGNOSIS — S30861A Insect bite (nonvenomous) of abdominal wall, initial encounter: Secondary | ICD-10-CM | POA: Diagnosis not present

## 2021-02-12 DIAGNOSIS — W57XXXA Bitten or stung by nonvenomous insect and other nonvenomous arthropods, initial encounter: Secondary | ICD-10-CM

## 2021-02-12 MED ORDER — DOXYCYCLINE HYCLATE 100 MG PO TABS
100.0000 mg | ORAL_TABLET | Freq: Two times a day (BID) | ORAL | 0 refills | Status: AC
  Filled 2021-02-12: qty 42, 21d supply, fill #0

## 2021-02-12 NOTE — Progress Notes (Signed)
   Virtual Visit  Note Due to COVID-19 pandemic this visit was conducted virtually. This visit type was conducted due to national recommendations for restrictions regarding the COVID-19 Pandemic (e.g. social distancing, sheltering in place) in an effort to limit this patient's exposure and mitigate transmission in our community. All issues noted in this document were discussed and addressed.  A physical exam was not performed with this format.  I connected with Jimmy Perez. on 02/12/21 at 4:09 pm by telephone and verified that I am speaking with the correct person using two identifiers. Jimmy Loretha Perez. is currently located at work and no one is currently with him during visit. The provider, Evelina Dun, FNP is located in their office at time of visit.  I discussed the limitations, risks, security and privacy concerns of performing an evaluation and management service by telephone and the availability of in person appointments. I also discussed with the patient that there may be a patient responsible charge related to this service. The patient expressed understanding and agreed to proceed.   History and Present Illness:  HPI  PT call with complaints of a tick bite on right waist that he removed on 02/09/21. He is unsure how long it had been attached, but thinks at least 24 hours. Denies any fever, rash. Complaining of fatigue and joint pain on  02/10/21.  After he removed the tick he noticed a red area, but that has resolved.   Review of Systems  Constitutional: Positive for malaise/fatigue. Negative for fever.  All other systems reviewed and are negative.    Observations/Objective: No SOB or distress   Assessment and Plan: 1. Tick bite of abdominal wall, initial encounter -Pt to report any new fever, joint pain, or rash -Wear protective clothing while outside- Long sleeves and long pants -Put insect repellent on all exposed skin and along clothing -Take a shower as  soon as possible after being outside RTO if symptoms worsen or do not improve  - doxycycline (VIBRA-TABS) 100 MG tablet; Take 1 tablet (100 mg total) by mouth 2 (two) times daily for 21 days.  Dispense: 42 tablet; Refill: 0     I discussed the assessment and treatment plan with the patient. The patient was provided an opportunity to ask questions and all were answered. The patient agreed with the plan and demonstrated an understanding of the instructions.   The patient was advised to call back or seek an in-person evaluation if the symptoms worsen or if the condition fails to improve as anticipated.  The above assessment and management plan was discussed with the patient. The patient verbalized understanding of and has agreed to the management plan. Patient is aware to call the clinic if symptoms persist or worsen. Patient is aware when to return to the clinic for a follow-up visit. Patient educated on when it is appropriate to go to the emergency department.   Time call ended:  4: 20 pm   I provided 11 minutes of  non face-to-face time during this encounter.    Evelina Dun, FNP

## 2021-02-28 ENCOUNTER — Other Ambulatory Visit (HOSPITAL_BASED_OUTPATIENT_CLINIC_OR_DEPARTMENT_OTHER): Payer: Self-pay

## 2021-02-28 ENCOUNTER — Other Ambulatory Visit: Payer: Self-pay

## 2021-02-28 DIAGNOSIS — E1169 Type 2 diabetes mellitus with other specified complication: Secondary | ICD-10-CM

## 2021-02-28 MED ORDER — SITAGLIPTIN PHOSPHATE 100 MG PO TABS
ORAL_TABLET | Freq: Every day | ORAL | 3 refills | Status: DC
  Filled 2021-02-28: qty 30, 30d supply, fill #0
  Filled 2021-04-11: qty 30, 30d supply, fill #1

## 2021-02-28 MED FILL — Metformin HCl Tab 500 MG: ORAL | 90 days supply | Qty: 90 | Fill #0 | Status: AC

## 2021-02-28 MED FILL — Amlodipine Besylate Tab 5 MG (Base Equivalent): ORAL | 90 days supply | Qty: 90 | Fill #0 | Status: AC

## 2021-02-28 MED FILL — Metoprolol Succinate Tab ER 24HR 25 MG (Tartrate Equiv): ORAL | 90 days supply | Qty: 90 | Fill #0 | Status: AC

## 2021-02-28 MED FILL — Pantoprazole Sodium EC Tab 40 MG (Base Equiv): ORAL | 90 days supply | Qty: 90 | Fill #0 | Status: AC

## 2021-03-01 ENCOUNTER — Other Ambulatory Visit (HOSPITAL_BASED_OUTPATIENT_CLINIC_OR_DEPARTMENT_OTHER): Payer: Self-pay

## 2021-03-04 ENCOUNTER — Other Ambulatory Visit (HOSPITAL_BASED_OUTPATIENT_CLINIC_OR_DEPARTMENT_OTHER): Payer: Self-pay

## 2021-03-21 ENCOUNTER — Other Ambulatory Visit (HOSPITAL_BASED_OUTPATIENT_CLINIC_OR_DEPARTMENT_OTHER): Payer: Self-pay

## 2021-03-21 ENCOUNTER — Ambulatory Visit (AMBULATORY_SURGERY_CENTER): Payer: No Typology Code available for payment source

## 2021-03-21 ENCOUNTER — Other Ambulatory Visit: Payer: Self-pay

## 2021-03-21 VITALS — Ht 67.0 in | Wt 235.0 lb

## 2021-03-21 DIAGNOSIS — Z86010 Personal history of colonic polyps: Secondary | ICD-10-CM

## 2021-03-21 MED ORDER — NA SULFATE-K SULFATE-MG SULF 17.5-3.13-1.6 GM/177ML PO SOLN
1.0000 | Freq: Once | ORAL | 0 refills | Status: AC
  Filled 2021-03-21: qty 354, 1d supply, fill #0

## 2021-03-21 NOTE — Progress Notes (Signed)
Pre visit completed via phone call;  Patient verified name, DOB, and address; No egg or soy allergy known to patient  No issues with past sedation with any surgeries or procedures Patient denies ever being told they had issues or difficulty with intubation  No FH of Malignant Hyperthermia No diet pills per patient No home 02 use per patient  No blood thinners per patient  Pt denies issues with constipation at this time;  No A fib or A flutter  EMMI video via [place] 19 guidelines implemented in PV today with Pt and RN  NO PA's for preps discussed with pt in PV today  Discussed with pt there will be an out-of-pocket cost for prep and that varies from $0 to 70 dollars  Due to the COVID-19 pandemic we are asking patients to follow certain guidelines.  Pt aware of COVID protocols and [facility] guidelines

## 2021-03-22 ENCOUNTER — Telehealth: Payer: Self-pay | Admitting: Family Medicine

## 2021-03-22 ENCOUNTER — Other Ambulatory Visit (HOSPITAL_BASED_OUTPATIENT_CLINIC_OR_DEPARTMENT_OTHER): Payer: Self-pay

## 2021-03-22 ENCOUNTER — Encounter: Payer: No Typology Code available for payment source | Admitting: Internal Medicine

## 2021-03-22 ENCOUNTER — Other Ambulatory Visit: Payer: Self-pay | Admitting: *Deleted

## 2021-03-22 DIAGNOSIS — B9789 Other viral agents as the cause of diseases classified elsewhere: Secondary | ICD-10-CM

## 2021-03-22 DIAGNOSIS — U071 COVID-19: Secondary | ICD-10-CM

## 2021-03-22 DIAGNOSIS — J329 Chronic sinusitis, unspecified: Secondary | ICD-10-CM

## 2021-03-22 MED ORDER — OSELTAMIVIR PHOSPHATE 75 MG PO CAPS
75.0000 mg | ORAL_CAPSULE | Freq: Every day | ORAL | 0 refills | Status: DC
  Filled 2021-03-22: qty 10, 10d supply, fill #0

## 2021-03-22 MED ORDER — FLUTICASONE PROPIONATE 50 MCG/ACT NA SUSP
1.0000 | Freq: Two times a day (BID) | NASAL | 6 refills | Status: DC | PRN
  Filled 2021-03-22: qty 16, 30d supply, fill #0

## 2021-03-22 NOTE — Telephone Encounter (Signed)
Tamiflu Prescription sent to pharmacy   

## 2021-03-22 NOTE — Telephone Encounter (Signed)
Patient aware and verbalized understanding. °

## 2021-04-04 ENCOUNTER — Encounter: Payer: Self-pay | Admitting: Internal Medicine

## 2021-04-04 ENCOUNTER — Other Ambulatory Visit: Payer: Self-pay

## 2021-04-04 ENCOUNTER — Ambulatory Visit (AMBULATORY_SURGERY_CENTER): Payer: No Typology Code available for payment source | Admitting: Internal Medicine

## 2021-04-04 VITALS — BP 123/72 | HR 58 | Temp 98.2°F | Resp 19 | Ht 67.0 in | Wt 235.0 lb

## 2021-04-04 DIAGNOSIS — K222 Esophageal obstruction: Secondary | ICD-10-CM

## 2021-04-04 DIAGNOSIS — D123 Benign neoplasm of transverse colon: Secondary | ICD-10-CM | POA: Diagnosis not present

## 2021-04-04 DIAGNOSIS — Z86010 Personal history of colonic polyps: Secondary | ICD-10-CM | POA: Diagnosis not present

## 2021-04-04 DIAGNOSIS — D214 Benign neoplasm of connective and other soft tissue of abdomen: Secondary | ICD-10-CM

## 2021-04-04 DIAGNOSIS — K449 Diaphragmatic hernia without obstruction or gangrene: Secondary | ICD-10-CM

## 2021-04-04 DIAGNOSIS — K635 Polyp of colon: Secondary | ICD-10-CM | POA: Diagnosis not present

## 2021-04-04 MED ORDER — SODIUM CHLORIDE 0.9 % IV SOLN: 500.0000 mL | INTRAVENOUS | Status: DC

## 2021-04-04 NOTE — Op Note (Signed)
[facility] Patient Name: Jimmy Perez Procedure Date: 04/04/2021 9:07 AM MRN: [id number] Endoscopist: Jerene Bears , MD Age: 55 Referring MD:  Date of Birth: [birth year] Gender: Male Account #: [id number] Procedure:                Colonoscopy Indications:              High risk colon cancer surveillance: Personal                            history of non-advanced adenoma, Last colonoscopy:                            2016 Medicines:                Monitored Anesthesia Care Procedure:                Pre-Anesthesia Assessment:                           - Prior to the procedure, a History and Physical                            was performed, and patient medications and                            allergies were reviewed. The patient's tolerance of                            previous anesthesia was also reviewed. The risks                            and benefits of the procedure and the sedation                            options and risks were discussed with the patient.                            All questions were answered, and informed consent                            was obtained. Prior Anticoagulants: The patient has                            taken no previous anticoagulant or antiplatelet                            agents. ASA Grade Assessment: II - A patient with                            mild systemic disease. After reviewing the risks                            and benefits, the patient was deemed in  satisfactory condition to undergo the procedure.                           After obtaining informed consent, the colonoscope                            was passed under direct vision. Throughout the                            procedure, the patient's blood pressure, pulse, and                            oxygen saturations were monitored continuously. The                            Olympus CF-HQ190 (#[id number]) Colonoscope was                             introduced through the anus and advanced to the                            terminal ileum. The colonoscopy was performed                            without difficulty. The patient tolerated the                            procedure well. The quality of the bowel                            preparation was good. The ileocecal valve,                            appendiceal orifice, and rectum were photographed. Scope In: 9:25:25 AM Scope Out: 9:37:14 AM Scope Withdrawal Time: 0 hours 10 minutes 53 seconds  Total Procedure Duration: 0 hours 11 minutes 49 seconds  Findings:                 The digital rectal exam was normal.                           A 4 mm polyp was found in the transverse colon. The                            polyp was sessile. The polyp was removed with a                            cold snare. Resection and retrieval were complete.                           Scattered small-mouthed diverticula were found in                            the sigmoid colon, descending colon and cecum.  The retroflexed view of the distal rectum and anal                            verge was normal and showed no anal or rectal                            abnormalities. Complications:            No immediate complications. Estimated Blood Loss:     Estimated blood loss was minimal. Impression:               - One 4 mm polyp in the transverse colon, removed                            with a cold snare. Resected and retrieved.                           - Diverticulosis in the sigmoid colon, in the                            descending colon and in the cecum.                           - The distal rectum and anal verge are normal on                            retroflexion view. Recommendation:           - Patient has a contact number available for                            emergencies. The signs and symptoms of potential                            delayed complications were  discussed with the                            patient. Return to normal activities tomorrow.                            Written discharge instructions were provided to the                            patient.                           - Resume previous diet.                           - Continue present medications.                           - Await pathology results.                           - Repeat colonoscopy is recommended. The  colonoscopy date will be determined after pathology                            results from today's exam become available for                            review. Jerene Bears, MD 04/04/2021 9:50:24 AM This report has been signed electronically.

## 2021-04-04 NOTE — Progress Notes (Signed)
Report to PACU, RN, vss, BBS= Clear.  

## 2021-04-04 NOTE — Progress Notes (Signed)
Called to room to assist during endoscopic procedure.  Patient ID and intended procedure confirmed with present staff. Received instructions for my participation in the procedure from the performing physician.  

## 2021-04-04 NOTE — Progress Notes (Signed)
VS by [facility]  I have reviewed the patient's medical history in detail and updated the computerized patient record.

## 2021-04-04 NOTE — Patient Instructions (Signed)
Resume previous diet and medications. Repeat colonoscopy based on pathology results.  YOU HAD AN ENDOSCOPIC PROCEDURE TODAY AT [facility]:   Refer to the procedure report that was given to you for any specific questions about what was found during the examination.  If the procedure report does not answer your questions, please call your gastroenterologist to clarify.  If you requested that your care partner not be given the details of your procedure findings, then the procedure report has been included in a sealed envelope for you to review at your convenience later.  YOU SHOULD EXPECT: Some feelings of bloating in the abdomen. Passage of more gas than usual.  Walking can help get rid of the air that was put into your GI tract during the procedure and reduce the bloating. If you had a lower endoscopy (such as a colonoscopy or flexible sigmoidoscopy) you may notice spotting of blood in your stool or on the toilet paper. If you underwent a bowel prep for your procedure, you may not have a normal bowel movement for a few days.  Please Note:  You might notice some irritation and congestion in your nose or some drainage.  This is from the oxygen used during your procedure.  There is no need for concern and it should clear up in a day or so.  SYMPTOMS TO REPORT IMMEDIATELY:  Following lower endoscopy (colonoscopy or flexible sigmoidoscopy):  Excessive amounts of blood in the stool  Significant tenderness or worsening of abdominal pains  Swelling of the abdomen that is new, acute  Fever of 100F or higher  Following upper endoscopy (EGD)  Vomiting of blood or coffee ground material  New chest pain or pain under the shoulder blades  Painful or persistently difficult swallowing  New shortness of breath  Fever of 100F or higher  Black, tarry-looking stools  For urgent or emergent issues, a gastroenterologist can be reached at any hour by calling [phone]. Do not use MyChart  messaging for urgent concerns.    DIET:  We do recommend a small meal at first, but then you may proceed to your regular diet.  Drink plenty of fluids but you should avoid alcoholic beverages for 24 hours.  ACTIVITY:  You should plan to take it easy for the rest of today and you should NOT DRIVE or use heavy machinery until tomorrow (because of the sedation medicines used during the test).    FOLLOW UP: Our staff will call the number listed on your records 48-72 hours following your procedure to check on you and address any questions or concerns that you may have regarding the information given to you following your procedure. If we do not reach you, we will leave a message.  We will attempt to reach you two times.  During this call, we will ask if you have developed any symptoms of COVID 19. If you develop any symptoms (ie: fever, flu-like symptoms, shortness of breath, cough etc.) before then, please call [phone].  If you test positive for Covid 19 in the 2 weeks post procedure, please call and report this information to [place].    If any biopsies were taken you will be contacted by phone or by letter within the next 1-3 weeks.  Please call us at [phone] if you have not heard about the biopsies in 3 weeks.    SIGNATURES/CONFIDENTIALITY: You and/or your care partner have signed paperwork which will be entered into your electronic medical record.  These signatures  attest to the fact that that the information above on your After Visit Summary has been reviewed and is understood.  Full responsibility of the confidentiality of this discharge information lies with you and/or your care-partner.

## 2021-04-04 NOTE — Op Note (Signed)
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 1 cm at that                            time); history of GERD Medicines:                Monitored Anesthesia Care Procedure:                Pre-Anesthesia Assessment:                           - Prior to the procedure, a History and Physical                            was performed, and patient medications and                            allergies were reviewed. The patient's tolerance of                            previous anesthesia was also reviewed. The risks                            and benefits of the procedure and the sedation                            options and risks were discussed with the patient.                            All questions were answered, and informed consent                            was obtained. Prior Anticoagulants: The patient has                            taken no previous anticoagulant or antiplatelet                            agents. ASA Grade Assessment: II - A patient with                            mild systemic disease. After reviewing the risks  and benefits, the patient was deemed in                            satisfactory condition to undergo the procedure.                           After obtaining informed consent, the endoscope was                            passed under direct vision. Throughout the                            procedure, the patient's blood pressure, pulse, and                            oxygen saturations were monitored  continuously. The                            Endoscope was introduced through the mouth, and                            advanced to the second part of duodenum. The upper                            GI endoscopy was accomplished without difficulty.                            The patient tolerated the procedure well. Scope In: Scope Out: Findings:                 A non-obstructing Schatzki ring was found at the                            gastroesophageal junction.                           A 3 cm hiatal hernia was present.                           The gastroesophageal flap valve was visualized                            endoscopically and classified as Hill Grade IV (no                            fold, wide open lumen, hiatal hernia present).                           A single 10 mm submucosal papule (nodule) with no                            bleeding was found in the gastric antrum. This                            appears  roughly stable and similar in size to when                            last seen in 2017.                           The exam of the stomach was otherwise normal.                           The examined duodenum was normal. Complications:            No immediate complications. Estimated Blood Loss:     Estimated blood loss: none. Impression:               - Non-obstructing Schatzki ring.                           - 3 cm hiatal hernia.                           - A single submucosal papule (nodule) found in the                            stomach. 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 Grossly unchanged.                           - Normal examined duodenum.                           - No specimens collected. Recommendation:           - Patient has a contact number available for                            emergencies. The signs and symptoms of potential                            delayed complications were discussed with the                             patient. Return to normal activities tomorrow.                            Written discharge instructions were provided to the                            patient.                           - Resume previous diet.                           - Continue present medications.                           -  I will ask Dr. Ardis Hughs to review as he had                            previously recommended EUS surveillance at 2 years. Jerene Bears, MD 04/04/2021 9:47:58 AM This report has been signed electronically.

## 2021-04-05 ENCOUNTER — Telehealth: Payer: Self-pay

## 2021-04-05 NOTE — Telephone Encounter (Signed)
-----   Message from [name], MD sent at 04/05/2021  7:47 AM EDT ----- Ulice Dash, I agree it looks about the same size and is probably still under 2 cm.  To be safe I think it would be a good idea to repeat formal measurements with endoscopic ultrasound in 1 year.  Can you let him know and I will have Ozell Ferrera put him on for recall EUS?  Crytal Pensinger, He needs recall endoscopic ultrasound for gastric mass in 1 year from now ----- Message ----- From: Jerene Bears, MD Sent: 04/04/2021   9:59 AM EDT To: [name], MD  Dan,  Please take a took at this GIST You monitored by EUS in 2016/2017 and recommended f/u but he didn't do it I did EGD today To me looks stable, not sure he needs EUS right now, but will defer to your recs If not now, when in the future? Thanks Ulice Dash

## 2021-04-05 NOTE — Telephone Encounter (Signed)
Recall has been entered as recommended

## 2021-04-08 ENCOUNTER — Telehealth: Payer: Self-pay

## 2021-04-08 ENCOUNTER — Telehealth: Payer: Self-pay | Admitting: *Deleted

## 2021-04-08 NOTE — Telephone Encounter (Signed)
  Follow up Call-  Call back number 04/04/2021  Post procedure Call Back phone  # [phone]  Permission to leave phone message Yes  Some recent data might be hidden     Patient questions:  Do you have a fever, pain , or abdominal swelling? No. Pain Score  0 *  Have you tolerated food without any problems? Yes.    Have you been able to return to your normal activities? Yes.    Do you have any questions about your discharge instructions: Diet   No. Medications  No. Follow up visit  No.  Do you have questions or concerns about your Care? No.  Actions: * If pain score is 4 or above: No action needed, pain <4.  Have you developed a fever since your procedure? no  2.   Have you had an respiratory symptoms (SOB or cough) since your procedure? no  3.   Have you tested positive for COVID 19 since your procedure no  4.   Have you had any family members/close contacts diagnosed with the COVID 19 since your procedure?  no   If yes to any of these questions please route to Joylene John, RN and Joella Prince, RN

## 2021-04-08 NOTE — Telephone Encounter (Signed)
Called # [phone] and left a message we tried to reach pt for a follow up call. maw

## 2021-04-10 ENCOUNTER — Encounter: Payer: Self-pay | Admitting: Internal Medicine

## 2021-04-11 ENCOUNTER — Other Ambulatory Visit (HOSPITAL_BASED_OUTPATIENT_CLINIC_OR_DEPARTMENT_OTHER): Payer: Self-pay

## 2021-04-11 ENCOUNTER — Other Ambulatory Visit: Payer: Self-pay | Admitting: Family Medicine

## 2021-04-11 MED ORDER — BUSPIRONE HCL 15 MG PO TABS
ORAL_TABLET | ORAL | 3 refills | Status: DC
  Filled 2021-04-11: qty 180, 90d supply, fill #0
  Filled 2021-09-27: qty 180, 90d supply, fill #1
  Filled 2022-04-04: qty 180, 90d supply, fill #2

## 2021-04-30 ENCOUNTER — Other Ambulatory Visit (HOSPITAL_BASED_OUTPATIENT_CLINIC_OR_DEPARTMENT_OTHER): Payer: Self-pay

## 2021-04-30 ENCOUNTER — Other Ambulatory Visit: Payer: Self-pay

## 2021-04-30 ENCOUNTER — Encounter: Payer: Self-pay | Admitting: Physician Assistant

## 2021-04-30 ENCOUNTER — Ambulatory Visit (INDEPENDENT_AMBULATORY_CARE_PROVIDER_SITE_OTHER): Payer: No Typology Code available for payment source | Admitting: Physician Assistant

## 2021-04-30 ENCOUNTER — Ambulatory Visit (INDEPENDENT_AMBULATORY_CARE_PROVIDER_SITE_OTHER): Payer: No Typology Code available for payment source

## 2021-04-30 VITALS — BP 135/88 | HR 65 | Temp 97.2°F | Ht 67.0 in | Wt 239.8 lb

## 2021-04-30 DIAGNOSIS — M25511 Pain in right shoulder: Secondary | ICD-10-CM | POA: Diagnosis not present

## 2021-04-30 MED ORDER — MELOXICAM 15 MG PO TABS
15.0000 mg | ORAL_TABLET | Freq: Every day | ORAL | 0 refills | Status: DC
  Filled 2021-04-30: qty 14, 14d supply, fill #0

## 2021-04-30 NOTE — Patient Instructions (Signed)
Rotator Cuff Tear  A rotator cuff tear is a partial or complete tear of the cord-like bands (tendons) that connect muscle to bone in the rotator cuff. The rotator cuff is a group of muscles and tendons that surround the shoulder joint and keep the upper arm bone (humerus) in the shoulder socket. The tear can occur suddenly (acute tear) or can develop over a long period of time (chronic tear). What are the causes? Acute tears may be caused by: A fall, especially on an outstretched arm. Lifting very heavy objects with a jerking motion. Chronic tears may be caused by overuse of the muscles. This may happen in sports, physical work, or activities in which your arm repeatedly moves overyour head. What increases the risk? This condition is more likely to occur in: Athletes and workers who frequently use their shoulder or reach over their head. This may include activities such as: Tennis. Baseball and softball. Swimming and rowing. Weight lifting. Construction work. Painting. People who smoke. Older people who have arthritis or poor blood supply. These can make the muscles and tendons weaker. What are the signs or symptoms? Symptoms of this condition depend on the type and severity of the injury: An acute tear may include a sudden tearing feeling, followed by severe pain that goes from your upper shoulder, down your arm, and toward your elbow. A chronic tear includes a gradual weakness and decreased shoulder motion as the pain gets worse. The pain is usually worse at night. Both types may have symptoms such as: Pain that spreads (radiates) from the shoulder to the upper arm. Swelling and tenderness in front of the shoulder. Decreased range of motion. Pain when: Reaching, pulling, or lifting the arm above the head. Lowering the arm from above the head. Not being able to raise your arm out to the side. Difficulty placing the arm behind your back. How is this diagnosed? This condition is  diagnosed with a medical history and physical exam. Imaging tests may also be done, including: X-rays. MRI. Ultrasound. CT or MR arthrogram. During this test, a contrast material is injected into your shoulder, and then images are taken. How is this treated? Treatment for this condition depends on the type and severity of the condition. In less severe cases, treatment may include: Rest. This may be done with a sling that holds the shoulder still (immobilization). Your health care provider may also recommend avoiding activities that involve lifting your arm over your head. Icing the shoulder. Anti-inflammatory medicines, such as aspirin or ibuprofen. Strengthening and stretching exercises. Your health care provider may recommend specific exercises to improve your range of motion and strengthen your shoulder. In more severe cases, treatment may include: Physical therapy. Steroid injections. Surgery. Follow these instructions at home: Managing pain, stiffness, and swelling     If directed, put ice on the injured area. To do this: If you have a removable sling, remove it as told by your health care provider. Put ice in a plastic bag. Place a towel between your skin and the bag. Leave the ice on for 20 minutes, 2-3 times a day. Remove the ice if your skin turns bright red. This is very important. If you cannot feel pain, heat, or cold, you have a greater risk of damage to the area. Raise (elevate) the injured area above the level of your heart while you are lying down. Find a comfortable sleeping position or sleep on a recliner, if available. Move your fingers often to reduce stiffness and swelling.   Once the swelling has gone down, your health care provider may direct you to apply heat to relax the muscles. Use the heat source that your health care provider recommends, such as a moist heat pack or a heating pad. Place a towel between your skin and the heat source. Leave the heat on for  20-30 minutes. Remove the heat if your skin turns bright red. This is especially important if you are unable to feel pain, heat, or cold. You have a greater risk of getting burned. If you have a sling: Wear the sling as told by your health care provider. Remove it only as told by your health care provider. Loosen the sling if your fingers tingle, become numb, or turn cold and blue. Keep the sling clean. If the sling is not waterproof: Do not let it get wet. Cover it with a watertight covering when you take a bath or a shower. Activity Rest your shoulder as told by your health care provider. Return to your normal activities as told by your health care provider. Ask your health care provider what activities are safe for you. Ask your health care provider when it is safe to drive if you have a sling on your arm. Do any exercises or stretches as told by your health care provider. General instructions Take over-the-counter and prescription medicines only as told by your health care provider. Do not use any products that contain nicotine or tobacco, such as cigarettes, e-cigarettes, and chewing tobacco. If you need help quitting, ask your health care provider. Keep all follow-up visits. This is important. Contact a health care provider if: Your pain gets worse. You have new pain in your arm, hands, or fingers. Medicine does not help your pain. Get help right away if: Your arm, hand, or fingers are numb or tingling. Your arm, hand, or fingers are swollen or painful or they turn white or blue. Your hand or fingers on your injured arm are colder than your other hand. Summary A rotator cuff tear is a partial or complete tear of the cord-like bands (tendons) that connect muscle to bone in the rotator cuff. The tear can occur suddenly (acute tear) or can develop over a long period of time (chronic tear). Treatment generally includes rest, anti-inflammatory medicines, and icing. In some cases,  physical therapy and steroid injections may be needed. In severe cases, surgery may be needed. This information is not intended to replace advice given to you by your health care provider. Make sure you discuss any questions you have with your healthcare provider. Document Revised: 02/15/2020 Document Reviewed: 02/15/2020 Elsevier Patient Education  2022 [employer]  

## 2021-04-30 NOTE — Progress Notes (Signed)
  Subjective:     Patient ID: Jimmy Loretha Stapler., male   DOB: 1966/09/23, 55 y.o.   MRN: [id number]  Shoulder Injury   Pt with pain to the lateral R shoulder States he was playing a game on concrete and fell on to the lateral R shoulder + pain and limited ROM to the shoulder Hx of part. 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   Musculoskeletal:  Positive for arthralgias and myalgias.      Objective:   Physical Exam Vitals and nursing note reviewed.  Constitutional:      General: He is not in acute distress.    Appearance: Normal appearance.   Wearing sling No TTP along the clavicle  No ant, post, or superior shoulder TTP ++ TTP of the lateral shoulder Pt able to get FROM of the shoulder but increase in sx with lateral and anterior abduction FROM of the elbow and wrist w/o sx Grip strength good and equal Sensory good Xray - see lab    Assessment:     1. Acute pain of right shoulder        Plan:     Heat/Ice Sling Work note Referral back to his Ortho - Dr Onnie Graham Mobic rx ROM exercises reviewed F/u prn

## 2021-05-02 ENCOUNTER — Other Ambulatory Visit: Payer: Self-pay | Admitting: Family Medicine

## 2021-05-02 DIAGNOSIS — S46001A Unspecified injury of muscle(s) and tendon(s) of the rotator cuff of right shoulder, initial encounter: Secondary | ICD-10-CM

## 2021-05-02 NOTE — Progress Notes (Signed)
Placed referral to Ortho for the patient

## 2021-05-06 ENCOUNTER — Other Ambulatory Visit (HOSPITAL_BASED_OUTPATIENT_CLINIC_OR_DEPARTMENT_OTHER): Payer: Self-pay

## 2021-05-06 MED FILL — Rosuvastatin Calcium Tab 40 MG: ORAL | 90 days supply | Qty: 90 | Fill #0 | Status: AC

## 2021-05-06 MED FILL — Losartan Potassium Tab 50 MG: ORAL | 90 days supply | Qty: 90 | Fill #0 | Status: AC

## 2021-05-06 MED FILL — Fenofibrate Tab 48 MG: ORAL | 90 days supply | Qty: 90 | Fill #0 | Status: AC

## 2021-05-07 ENCOUNTER — Other Ambulatory Visit (HOSPITAL_BASED_OUTPATIENT_CLINIC_OR_DEPARTMENT_OTHER): Payer: Self-pay

## 2021-05-07 MED FILL — Icosapent Ethyl Cap 1 GM: ORAL | 90 days supply | Qty: 360 | Fill #0 | Status: AC

## 2021-05-28 ENCOUNTER — Other Ambulatory Visit (HOSPITAL_BASED_OUTPATIENT_CLINIC_OR_DEPARTMENT_OTHER): Payer: Self-pay

## 2021-05-28 MED FILL — Amlodipine Besylate Tab 5 MG (Base Equivalent): ORAL | 90 days supply | Qty: 90 | Fill #1 | Status: AC

## 2021-05-28 MED FILL — Pantoprazole Sodium EC Tab 40 MG (Base Equiv): ORAL | 90 days supply | Qty: 90 | Fill #1 | Status: AC

## 2021-05-28 MED FILL — Metoprolol Succinate Tab ER 24HR 25 MG (Tartrate Equiv): ORAL | 90 days supply | Qty: 90 | Fill #1 | Status: AC

## 2021-05-28 MED FILL — Metformin HCl Tab 500 MG: ORAL | 90 days supply | Qty: 90 | Fill #1 | Status: AC

## 2021-06-07 ENCOUNTER — Other Ambulatory Visit (HOSPITAL_BASED_OUTPATIENT_CLINIC_OR_DEPARTMENT_OTHER): Payer: Self-pay

## 2021-06-07 ENCOUNTER — Other Ambulatory Visit: Payer: Self-pay

## 2021-06-07 ENCOUNTER — Encounter: Payer: Self-pay | Admitting: Family Medicine

## 2021-06-07 ENCOUNTER — Ambulatory Visit (INDEPENDENT_AMBULATORY_CARE_PROVIDER_SITE_OTHER): Payer: No Typology Code available for payment source | Admitting: Family Medicine

## 2021-06-07 DIAGNOSIS — E1169 Type 2 diabetes mellitus with other specified complication: Secondary | ICD-10-CM

## 2021-06-07 DIAGNOSIS — E782 Mixed hyperlipidemia: Secondary | ICD-10-CM | POA: Diagnosis not present

## 2021-06-07 DIAGNOSIS — I10 Essential (primary) hypertension: Secondary | ICD-10-CM

## 2021-06-07 LAB — CMP14+EGFR
ALT: 38 IU/L (ref 0–44)
AST: 16 IU/L (ref 0–40)
Albumin/Globulin Ratio: 2.7 — ABNORMAL HIGH (ref 1.2–2.2)
Albumin: 4.8 g/dL (ref 3.8–4.9)
Alkaline Phosphatase: 72 IU/L (ref 44–121)
BUN/Creatinine Ratio: 16 (ref 9–20)
BUN: 14 mg/dL (ref 6–24)
Bilirubin Total: 0.5 mg/dL (ref 0.0–1.2)
CO2: 23 mmol/L (ref 20–29)
Calcium: 9.3 mg/dL (ref 8.7–10.2)
Chloride: 101 mmol/L (ref 96–106)
Creatinine, Ser: 0.85 mg/dL (ref 0.76–1.27)
Globulin, Total: 1.8 g/dL (ref 1.5–4.5)
Glucose: 179 mg/dL — ABNORMAL HIGH (ref 65–99)
Potassium: 4.6 mmol/L (ref 3.5–5.2)
Sodium: 139 mmol/L (ref 134–144)
Total Protein: 6.6 g/dL (ref 6.0–8.5)
eGFR: 103 mL/min/{1.73_m2} (ref >59)

## 2021-06-07 LAB — CBC WITH DIFFERENTIAL/PLATELET
Basophils Absolute: 0.1 10*3/uL (ref 0.0–0.2)
Basos: 1 %
EOS (ABSOLUTE): 0.2 10*3/uL (ref 0.0–0.4)
Eos: 4 %
Hematocrit: 43.2 % (ref 37.5–51.0)
Hemoglobin: 14.7 g/dL (ref 13.0–17.7)
Immature Grans (Abs): 0 10*3/uL (ref 0.0–0.1)
Immature Granulocytes: 1 %
Lymphocytes Absolute: 2 10*3/uL (ref 0.7–3.1)
Lymphs: 30 %
MCH: 31.8 pg (ref 26.6–33.0)
MCHC: 34 g/dL (ref 31.5–35.7)
MCV: 94 fL (ref 79–97)
Monocytes Absolute: 0.8 10*3/uL (ref 0.1–0.9)
Monocytes: 11 %
Neutrophils Absolute: 3.6 10*3/uL (ref 1.4–7.0)
Neutrophils: 53 %
Platelets: 246 10*3/uL (ref 150–450)
RBC: 4.62 x10E6/uL (ref 4.14–5.80)
RDW: 12.5 % (ref 11.6–15.4)
WBC: 6.7 10*3/uL (ref 3.4–10.8)

## 2021-06-07 LAB — LIPID PANEL
Chol/HDL Ratio: 4.1 ratio (ref 0.0–5.0)
Cholesterol, Total: 127 mg/dL (ref 100–199)
HDL: 31 mg/dL — ABNORMAL LOW (ref >39)
LDL Chol Calc (NIH): 46 mg/dL (ref 0–99)
Triglycerides: 332 mg/dL — ABNORMAL HIGH (ref 0–149)
VLDL Cholesterol Cal: 50 mg/dL — ABNORMAL HIGH (ref 5–40)

## 2021-06-07 LAB — BAYER DCA HB A1C WAIVED: HB A1C (BAYER DCA - WAIVED): 7.8 % — ABNORMAL HIGH (ref <7.0)

## 2021-06-07 MED ORDER — TRULICITY 1.5 MG/0.5ML ~~LOC~~ SOAJ
1.5000 mg | SUBCUTANEOUS | 3 refills | Status: DC
Start: 2021-06-07 — End: 2021-09-16
  Filled 2021-06-07: qty 6, 84d supply, fill #0
  Filled 2021-08-30: qty 6, 84d supply, fill #1

## 2021-06-07 MED ORDER — TRULICITY 0.75 MG/0.5ML ~~LOC~~ SOAJ
0.7500 mg | SUBCUTANEOUS | 0 refills | Status: DC
  Filled 2021-06-07: qty 1, 14d supply, fill #0
  Filled 2021-12-03: qty 2, 28d supply, fill #0

## 2021-06-07 NOTE — Progress Notes (Signed)
BP 132/74   Pulse 72   Temp 98.5 F (36.9 C) (Temporal)   Ht '5\' 7"'  (1.702 m)   Wt 238 lb (108 kg)   SpO2 95%   BMI 37.28 kg/m    Subjective:   Patient ID: Jimmy Loretha Stapler., male    DOB: [birth year], 55 y.o.   MRN: [id number]  HPI: Jimmy Tryson Lumley. is a 55 y.o. male presenting on 06/07/2021 for Medical Management of Chronic Issues, Diabetes, and Hyperlipidemia   HPI Type 2 diabetes mellitus Patient comes in today for recheck of his diabetes. Patient has been currently taking metformin, has been trying diet and struggling. Patient is currently on an ACE inhibitor/ARB. Patient has seen an ophthalmologist this year. Patient denies any issues with their feet. The symptom started onset as an adult hypertension and hyperlipidemia ARE RELATED TO DM   Hypertension Patient is currently on amlodipine and metoprolol and losartan, and their blood pressure today is 132/74. Patient denies any lightheadedness or dizziness. Patient denies headaches, blurred vision, chest pains, shortness of breath, or weakness. Denies any side effects from medication and is content with current medication.   Hyperlipidemia Patient is coming in for recheck of his hyperlipidemia. The patient is currently taking Crestor and fenofibrate and Vascepa. They deny any issues with myalgias or history of liver damage from it. They deny any focal numbness or weakness or chest pain.   Relevant past medical, surgical, family and social history reviewed and updated as indicated. Interim medical history since our last visit reviewed. Allergies and medications reviewed and updated.  Review of Systems  Constitutional:  Negative for chills and fever.  Eyes:  Negative for visual disturbance.  Respiratory:  Negative for shortness of breath and wheezing.   Cardiovascular:  Negative for chest pain and leg swelling.  Musculoskeletal:  Negative for back pain and gait problem.  Skin:  Negative for rash.  Neurological:  Negative  for dizziness, weakness and light-headedness.  All other systems reviewed and are negative.  Per HPI unless specifically indicated above   Allergies as of 06/07/2021   No Known Allergies      Medication List        Accurate as of June 07, 2021 11:59 PM. If you have any questions, ask your nurse or doctor.          STOP taking these medications    Januvia 100 MG tablet Generic drug: sitaGLIPtin Stopped by: Jimmy Kaufmann Arleen Bar, MD   oseltamivir 75 MG capsule Commonly known as: Tamiflu Stopped by: Jimmy Kaufmann Taja Pentland, MD       TAKE these medications    amLODipine 5 MG tablet Commonly known as: NORVASC TAKE 1 TABLET BY MOUTH ONCE DAILY   aspirin EC 81 MG tablet Take 81 mg by mouth daily.   Biotin 10000 MCG Tbdp Take 1 tablet by mouth daily.   busPIRone 15 MG tablet Commonly known as: BUSPAR TAKE 1 TABLET (15 MG TOTAL) BY MOUTH 2 (TWO) TIMES DAILY AS NEEDED.   CALCIUM-MAGNESIUM-ZINC-D3 PO Take 1 tablet by mouth daily at 6 (six) AM.   colchicine 0.6 MG tablet TAKE 1.2 MG (2 TABLETS) BY MOUTH ONCE, THEN TAKE 0.6 MG ONE HOUR LATER. USE AS NEEDED FOR GOUT FLARE. DO NOT REPEAT FOR AT LEAST 3 DAYS.   fenofibrate 48 MG tablet Commonly known as: TRICOR TAKE 1 TABLET (48 MG TOTAL) BY MOUTH DAILY.   fluticasone 50 MCG/ACT nasal spray Commonly known as: FLONASE Place 1 spray into both  nostrils 2 (two) times daily as needed for allergies or rhinitis.   FreeStyle Freedom Lite w/Device Kit Check BS once daily   FreeStyle Lite w/Device Kit CHECK BLOOD SUGAR ONCE DAILY   freestyle lancets CHECK BLOOD SUGAR ONCE DAILY   freestyle lancets SMARTSIG:Topical Daily   FREESTYLE LITE test strip Generic drug: glucose blood CHECK BLOOD SUGAR ONCE DAILY   glucose blood test strip SMARTSIG:Via Meter Daily   icosapent Ethyl 1 g capsule Commonly known as: VASCEPA TAKE 2 CAPSULES BY MOUTH TWO TIMES DAILY **OFFICE VISIT NEEDED FOR FURTHER REFILLS**   losartan 50 MG  tablet Commonly known as: COZAAR TAKE 1 TABLET (50 MG TOTAL) BY MOUTH DAILY.   meloxicam 15 MG tablet Commonly known as: MOBIC Take 1 tablet (15 mg total) by mouth daily.   metFORMIN 500 MG tablet Commonly known as: GLUCOPHAGE TAKE 1 TABLET BY MOUTH DAILY WITH BREAKFAST   metoprolol succinate 25 MG 24 hr tablet Commonly known as: TOPROL-XL TAKE 1 TABLET BY MOUTH ONCE DAILY   nitroGLYCERIN 0.4 MG SL tablet Commonly known as: NITROSTAT Place 1 tablet (0.4 mg total) under the tongue every 5 (five) minutes as needed for chest pain.   pantoprazole 40 MG tablet Commonly known as: PROTONIX TAKE 1 TABLET BY MOUTH ONCE DAILY   rosuvastatin 40 MG tablet Commonly known as: CRESTOR TAKE 1 TABLET (40 MG TOTAL) BY MOUTH AT BEDTIME.   Trulicity 0.81 KG/8.1EH Sopn Generic drug: Dulaglutide Inject 0.75 mg into the skin once a week for 14 days. Started by: Worthy Rancher, MD   Trulicity 1.5 UD/1.4HF Sopn Generic drug: Dulaglutide Inject 1.5 mg into the skin once a week. Started by: Jimmy Kaufmann Jerauld Bostwick, MD   VITAMIN D PO Take 10,000 mg by mouth daily at 6 (six) AM.   ZYRTEC ALLERGY PO Take 1 tablet by mouth daily at 6 (six) AM.         Objective:   BP 132/74   Pulse 72   Temp 98.5 F (36.9 C) (Temporal)   Ht '5\' 7"'  (1.702 m)   Wt 238 lb (108 kg)   SpO2 95%   BMI 37.28 kg/m   Wt Readings from Last 3 Encounters:  06/07/21 238 lb (108 kg)  04/30/21 239 lb 12.8 oz (108.8 kg)  04/04/21 235 lb (106.6 kg)    Physical Exam Vitals and nursing note reviewed.  Constitutional:      General: He is not in acute distress.    Appearance: He is well-developed. He is not diaphoretic.  Eyes:     General: No scleral icterus.    Conjunctiva/sclera: Conjunctivae normal.  Neck:     Thyroid: No thyromegaly.  Cardiovascular:     Rate and Rhythm: Normal rate and regular rhythm.     Heart sounds: Normal heart sounds. No murmur heard. Pulmonary:     Effort: Pulmonary effort is  normal. No respiratory distress.     Breath sounds: Normal breath sounds. No wheezing.  Musculoskeletal:        General: No swelling. Normal range of motion.     Cervical back: Neck supple.  Lymphadenopathy:     Cervical: No cervical adenopathy.  Skin:    General: Skin is warm and dry.     Findings: No rash.  Neurological:     Mental Status: He is alert and oriented to person, place, and time.     Coordination: Coordination normal.  Psychiatric:        Behavior: Behavior normal.  Results for orders placed or performed in visit on 06/07/21  Bayer DCA Hb A1c Waived  Result Value Ref Range   HB A1C (BAYER DCA - WAIVED) 7.8 (H) <7.0 %  Lipid panel  Result Value Ref Range   Cholesterol, Total 127 100 - 199 mg/dL   Triglycerides 332 (H) 0 - 149 mg/dL   HDL 31 (L) >39 mg/dL   VLDL Cholesterol Cal 50 (H) 5 - 40 mg/dL   LDL Chol Calc (NIH) 46 0 - 99 mg/dL   Chol/HDL Ratio 4.1 0.0 - 5.0 ratio  CMP14+EGFR  Result Value Ref Range   Glucose 179 (H) 65 - 99 mg/dL   BUN 14 6 - 24 mg/dL   Creatinine, Ser 0.85 0.76 - 1.27 mg/dL   eGFR 103 >59 mL/min/1.73   BUN/Creatinine Ratio 16 9 - 20   Sodium 139 134 - 144 mmol/L   Potassium 4.6 3.5 - 5.2 mmol/L   Chloride 101 96 - 106 mmol/L   CO2 23 20 - 29 mmol/L   Calcium 9.3 8.7 - 10.2 mg/dL   Total Protein 6.6 6.0 - 8.5 g/dL   Albumin 4.8 3.8 - 4.9 g/dL   Globulin, Total 1.8 1.5 - 4.5 g/dL   Albumin/Globulin Ratio 2.7 (H) 1.2 - 2.2   Bilirubin Total 0.5 0.0 - 1.2 mg/dL   Alkaline Phosphatase 72 44 - 121 IU/L   AST 16 0 - 40 IU/L   ALT 38 0 - 44 IU/L  CBC with Differential/Platelet  Result Value Ref Range   WBC 6.7 3.4 - 10.8 x10E3/uL   RBC 4.62 4.14 - 5.80 x10E6/uL   Hemoglobin 14.7 13.0 - 17.7 g/dL   Hematocrit 43.2 37.5 - 51.0 %   MCV 94 79 - 97 fL   MCH 31.8 26.6 - 33.0 pg   MCHC 34.0 31.5 - 35.7 g/dL   RDW 12.5 11.6 - 15.4 %   Platelets 246 150 - 450 x10E3/uL   Neutrophils 53 Not Estab. %   Lymphs 30 Not Estab. %    Monocytes 11 Not Estab. %   Eos 4 Not Estab. %   Basos 1 Not Estab. %   Neutrophils Absolute 3.6 1.4 - 7.0 x10E3/uL   Lymphocytes Absolute 2.0 0.7 - 3.1 x10E3/uL   Monocytes Absolute 0.8 0.1 - 0.9 x10E3/uL   EOS (ABSOLUTE) 0.2 0.0 - 0.4 x10E3/uL   Basophils Absolute 0.1 0.0 - 0.2 x10E3/uL   Immature Granulocytes 1 Not Estab. %   Immature Grans (Abs) 0.0 0.0 - 0.1 x10E3/uL    Assessment & Plan:   Problem List Items Addressed This Visit       Cardiovascular and Mediastinum   HTN (hypertension)   Relevant Medications   amLODipine (NORVASC) 5 MG tablet   icosapent Ethyl (VASCEPA) 1 g capsule   metoprolol succinate (TOPROL-XL) 25 MG 24 hr tablet     Endocrine   Type 2 diabetes mellitus with other specified complication (HCC)   Relevant Medications   Dulaglutide (TRULICITY) 7.62 UQ/3.3HL SOPN   Dulaglutide (TRULICITY) 1.5 KT/6.2BW SOPN   metFORMIN (GLUCOPHAGE) 500 MG tablet     Other   Hyperlipidemia   Relevant Medications   amLODipine (NORVASC) 5 MG tablet   icosapent Ethyl (VASCEPA) 1 g capsule   metoprolol succinate (TOPROL-XL) 25 MG 24 hr tablet    Stop Januvia and will start Trulicity, started 3.89 and then go up to 1.5 and follow-up in 3 months.  Can also follow-up with Almyra Free in 1 month for sooner  follow-up.  A1c is improved but not to where he would like it, 7.8.  Blood pressure looks good and no other change in the medication.  He is also struggling with weight loss because he has made a lot of changes in his diet. Follow up plan: Return in about 3 months (around 09/07/2021), or if symptoms worsen or fail to improve, for Diabetes and hypertension and cholesterol.  Counseling provided for all of the vaccine components No orders of the defined types were placed in this encounter.   Caryl Pina, MD [facility] 06/17/2021, 10:27 AM

## 2021-06-10 ENCOUNTER — Other Ambulatory Visit (HOSPITAL_BASED_OUTPATIENT_CLINIC_OR_DEPARTMENT_OTHER): Payer: Self-pay

## 2021-06-11 ENCOUNTER — Other Ambulatory Visit (HOSPITAL_BASED_OUTPATIENT_CLINIC_OR_DEPARTMENT_OTHER): Payer: Self-pay

## 2021-06-11 MED ORDER — ICOSAPENT ETHYL 1 G PO CAPS
ORAL_CAPSULE | ORAL | 3 refills | Status: DC
  Filled 2021-06-11: qty 360, fill #0
  Filled 2021-08-30: qty 360, 90d supply, fill #0
  Filled 2021-12-03: qty 360, 90d supply, fill #1
  Filled 2022-04-03: qty 360, 90d supply, fill #2

## 2021-06-11 MED ORDER — AMLODIPINE BESYLATE 5 MG PO TABS
ORAL_TABLET | Freq: Every day | ORAL | 3 refills | Status: DC
  Filled 2021-06-11: qty 90, fill #0
  Filled 2021-08-30: qty 90, 90d supply, fill #0
  Filled 2021-12-03: qty 90, 90d supply, fill #1
  Filled 2022-03-06: qty 90, 90d supply, fill #2

## 2021-06-11 MED ORDER — PANTOPRAZOLE SODIUM 40 MG PO TBEC
DELAYED_RELEASE_TABLET | Freq: Every day | ORAL | 3 refills | Status: DC
  Filled 2021-06-11: qty 90, fill #0
  Filled 2021-08-30: qty 90, 90d supply, fill #0
  Filled 2021-12-03: qty 90, 90d supply, fill #1
  Filled 2022-03-06: qty 90, 90d supply, fill #2

## 2021-06-11 MED ORDER — METOPROLOL SUCCINATE ER 25 MG PO TB24
ORAL_TABLET | Freq: Every day | ORAL | 3 refills | Status: DC
  Filled 2021-06-11: qty 90, fill #0
  Filled 2021-08-30: qty 90, 90d supply, fill #0
  Filled 2021-12-03: qty 90, 90d supply, fill #1
  Filled 2022-03-06: qty 90, 90d supply, fill #2

## 2021-06-11 MED ORDER — METFORMIN HCL 500 MG PO TABS
ORAL_TABLET | Freq: Every day | ORAL | 3 refills | Status: DC
  Filled 2021-06-11: qty 90, fill #0
  Filled 2021-08-30: qty 90, 90d supply, fill #0
  Filled 2021-12-03: qty 90, 90d supply, fill #1

## 2021-06-16 ENCOUNTER — Telehealth: Payer: No Typology Code available for payment source | Admitting: Family

## 2021-06-16 DIAGNOSIS — J069 Acute upper respiratory infection, unspecified: Secondary | ICD-10-CM | POA: Diagnosis not present

## 2021-06-16 MED ORDER — BENZONATATE 100 MG PO CAPS
100.0000 mg | ORAL_CAPSULE | Freq: Three times a day (TID) | ORAL | 0 refills | Status: DC | PRN
  Filled 2021-06-16: qty 20, 7d supply, fill #0

## 2021-06-16 MED ORDER — FLUTICASONE PROPIONATE 50 MCG/ACT NA SUSP
2.0000 | Freq: Every day | NASAL | 6 refills | Status: DC
  Filled 2021-06-16: qty 16, 30d supply, fill #0
  Filled 2021-09-27: qty 16, 30d supply, fill #1

## 2021-06-16 MED ORDER — AMOXICILLIN 500 MG PO CAPS
500.0000 mg | ORAL_CAPSULE | Freq: Two times a day (BID) | ORAL | 0 refills | Status: AC
  Filled 2021-06-16: qty 20, 10d supply, fill #0

## 2021-06-16 NOTE — Addendum Note (Signed)
Addended by: Mar Daring on: 06/16/2021 02:13 PM   Modules accepted: Orders

## 2021-06-16 NOTE — Progress Notes (Signed)

## 2021-06-17 ENCOUNTER — Other Ambulatory Visit (HOSPITAL_BASED_OUTPATIENT_CLINIC_OR_DEPARTMENT_OTHER): Payer: Self-pay

## 2021-07-24 NOTE — Progress Notes (Signed)
HPI male nonsmoker followed for OSA, complicated by HBP, GERD, obesity, CAD/ stent Unattended home sleep study 01/05/15 AHI 77/ hr, desat to 73%, weight 253 lbs   -----------------------------------------------------------------------------------------.   07/23/20- 55 year old male nonsmoker followed for OSA, complicated by HBP, GERD, obesity, CAD/ stent, Hyperlipidemia, DM2, GERD, Morbid Obesity,  CPAP auto 10-20/ [facility] Download- compliance 93%, AHI 1.1/ hr Body weight today- 242 lbs Covid vax- none Flu vax- later -----Pt states he has been doing well since last visit. Pt states he wears his cpap between 6-8 hours each night and states he feels rested when he wakes up. DME: [facility]. Comfortable with CPAP. Download reviewed with him. Discussed his decision not to get vaccinated. Invited questions and recommended vaccination.   07/25/21- 55 year old male never smoker followed for OSA, complicated by HBP, GERD, obesity, CAD/ stent, Hyperlipidemia, DM2, GERD, Morbid Obesity,  CPAP auto 10-20/ [facility] Download- compliance 100%, AHI 1.5/ hr  (from April)  AirSense 10 AutoSet Body weight today- Covid vax-none Flu vax-had Download reviewed.  Doing well with CPAP.  No concerns expressed.  Denies interval health problems or questions.  We can replace old machine.  ROS-see HPI   + = positive Constitutional:   No-   weight loss, night sweats, fevers, chills, +fatigue, lassitude. HEENT:   No-  headaches, difficulty swallowing, tooth/dental problems, sore throat,       No-  sneezing, itching, ear ache, nasal congestion, post nasal drip,  CV:  No-   chest pain, orthopnea, PND, swelling in lower extremities, anasarca,                                                    dizziness, palpitations Resp: No-   shortness of breath with exertion or at rest.              No-   productive cough,  No non-productive cough,  No- coughing up of blood.              No-   change in color of mucus.  No- wheezing.   Skin:  No-   rash or lesions. GI:  +heartburn, indigestion, No-abdominal pain, nausea, vomiting,  GU:  MS:  No-   joint pain or swelling.   Neuro-     nothing unusual Psych:  No- change in mood or affect. No depression or anxiety.  No memory loss.  OBJ- Physical Exam     +obese General- Alert, Oriented, Affect-appropriate, Distress- none acute, +obese Skin- rash-none, lesions- none, excoriation- none Lymphadenopathy- none Head- atraumatic            Eyes- Gross vision intact, PERRLA, conjunctivae and secretions clear            Ears- +Hearing aid            Nose- Clear, no-Septal dev, mucus, polyps, erosion, perforation             Throat- Mallampati IV , mucosa clear , drainage- none, tonsils- atrophic, + hoarse Neck- flexible , trachea midline, no stridor , thyroid nl, carotid no bruit Chest - symmetrical excursion , unlabored           Heart/CV- RRR , no murmur , no gallop  , no rub, nl s1 s2                           -  JVD- none , edema- none, stasis changes- none, varices- none           Lung- clear to P&A, wheeze- none, cough- none , dullness-none, rub- none           Chest wall-  Abd-  Br/ Gen/ Rectal- Not done, not indicated Extrem- cyanosis- none, clubbing, none, atrophy- none, strength- nl Neuro- grossly intact to observation

## 2021-07-25 ENCOUNTER — Ambulatory Visit: Payer: No Typology Code available for payment source | Admitting: Internal Medicine

## 2021-07-25 ENCOUNTER — Other Ambulatory Visit: Payer: Self-pay

## 2021-07-25 VITALS — BP 128/92 | HR 75 | Temp 98.2°F | Ht 68.0 in | Wt 232.4 lb

## 2021-07-25 DIAGNOSIS — I10 Essential (primary) hypertension: Secondary | ICD-10-CM

## 2021-07-25 DIAGNOSIS — G4733 Obstructive sleep apnea (adult) (pediatric): Secondary | ICD-10-CM | POA: Diagnosis not present

## 2021-07-25 NOTE — Patient Instructions (Signed)
Order- DME [facility]- please replace old CPAP machine auto 10-20, mask of choice,e humidifier, supplies, AirView/ card  Please call if we can help

## 2021-08-07 ENCOUNTER — Other Ambulatory Visit (HOSPITAL_BASED_OUTPATIENT_CLINIC_OR_DEPARTMENT_OTHER): Payer: Self-pay

## 2021-08-07 MED FILL — Fenofibrate Tab 48 MG: ORAL | 90 days supply | Qty: 90 | Fill #1 | Status: AC

## 2021-08-07 MED FILL — Losartan Potassium Tab 50 MG: ORAL | 90 days supply | Qty: 90 | Fill #1 | Status: AC

## 2021-08-08 ENCOUNTER — Other Ambulatory Visit (HOSPITAL_BASED_OUTPATIENT_CLINIC_OR_DEPARTMENT_OTHER): Payer: Self-pay

## 2021-08-08 ENCOUNTER — Other Ambulatory Visit: Payer: Self-pay

## 2021-08-08 MED ORDER — ROSUVASTATIN CALCIUM 40 MG PO TABS
ORAL_TABLET | Freq: Every day | ORAL | 3 refills | Status: DC
  Filled 2021-08-08: qty 90, 90d supply, fill #0
  Filled 2021-11-06: qty 90, 90d supply, fill #1
  Filled 2022-04-04: qty 90, 90d supply, fill #2

## 2021-08-30 ENCOUNTER — Other Ambulatory Visit (HOSPITAL_BASED_OUTPATIENT_CLINIC_OR_DEPARTMENT_OTHER): Payer: Self-pay

## 2021-09-06 ENCOUNTER — Ambulatory Visit: Payer: No Typology Code available for payment source | Admitting: Family Medicine

## 2021-09-12 ENCOUNTER — Other Ambulatory Visit: Payer: Self-pay

## 2021-09-12 DIAGNOSIS — E1169 Type 2 diabetes mellitus with other specified complication: Secondary | ICD-10-CM

## 2021-09-12 DIAGNOSIS — I10 Essential (primary) hypertension: Secondary | ICD-10-CM

## 2021-09-12 DIAGNOSIS — E782 Mixed hyperlipidemia: Secondary | ICD-10-CM

## 2021-09-13 ENCOUNTER — Other Ambulatory Visit (HOSPITAL_BASED_OUTPATIENT_CLINIC_OR_DEPARTMENT_OTHER): Payer: Self-pay

## 2021-09-13 ENCOUNTER — Other Ambulatory Visit: Payer: Self-pay

## 2021-09-13 ENCOUNTER — Encounter: Payer: Self-pay | Admitting: Family Medicine

## 2021-09-13 ENCOUNTER — Ambulatory Visit (INDEPENDENT_AMBULATORY_CARE_PROVIDER_SITE_OTHER): Payer: No Typology Code available for payment source | Admitting: Family Medicine

## 2021-09-13 VITALS — BP 134/85 | HR 68 | Ht 68.0 in | Wt 228.0 lb

## 2021-09-13 DIAGNOSIS — E1169 Type 2 diabetes mellitus with other specified complication: Secondary | ICD-10-CM | POA: Diagnosis not present

## 2021-09-13 DIAGNOSIS — I10 Essential (primary) hypertension: Secondary | ICD-10-CM | POA: Diagnosis not present

## 2021-09-13 DIAGNOSIS — E782 Mixed hyperlipidemia: Secondary | ICD-10-CM

## 2021-09-13 MED ORDER — FENOFIBRATE 48 MG PO TABS
ORAL_TABLET | ORAL | 3 refills | Status: DC
  Filled 2021-09-13: qty 90, fill #0
  Filled 2021-12-03: qty 90, 90d supply, fill #0

## 2021-09-13 MED ORDER — LOSARTAN POTASSIUM 50 MG PO TABS
ORAL_TABLET | Freq: Every day | ORAL | 3 refills | Status: DC
  Filled 2021-09-13: qty 90, fill #0

## 2021-09-13 NOTE — Progress Notes (Signed)
BP 134/85   Pulse 68   Ht 5' 8" (1.727 m)   Wt 228 lb (103.4 kg)   SpO2 96%   BMI 34.67 kg/m    Subjective:   Patient ID: Jimmy Loretha Stapler., male    DOB: December 13, 1965, 55 y.o.   MRN: [id number]  HPI: Jimmy Reyes Aldaco. is a 55 y.o. male presenting on 09/13/2021 for Medical Management of Chronic Issues, Diabetes, Hypertension, and Hyperlipidemia   HPI Type 2 diabetes mellitus Patient comes in today for recheck of his diabetes. Patient has been currently taking metformin and trulicity. Patient is currently on an ACE inhibitor/ARB. Patient has not seen an ophthalmologist this year. Patient denies any issues with their feet. The symptom started onset as an adult hypertension and hyperlipidemia ARE RELATED TO DM   Hypertension Patient is currently on metoprolol and losartan and amlodipine, and their blood pressure today is 134/85. Patient denies any lightheadedness or dizziness. Patient denies headaches, blurred vision, chest pains, shortness of breath, or weakness. Denies any side effects from medication and is content with current medication.   Hyperlipidemia Patient is coming in for recheck of his hyperlipidemia. The patient is currently taking Vascepa and Crestor. They deny any issues with myalgias or history of liver damage from it. They deny any focal numbness or weakness or chest pain.   Relevant past medical, surgical, family and social history reviewed and updated as indicated. Interim medical history since our last visit reviewed. Allergies and medications reviewed and updated.  Review of Systems  Constitutional:  Negative for chills and fever.  Eyes:  Negative for visual disturbance.  Respiratory:  Negative for shortness of breath and wheezing.   Cardiovascular:  Negative for chest pain and leg swelling.  Musculoskeletal:  Negative for back pain and gait problem.  Skin:  Negative for rash.  Neurological:  Negative for dizziness, weakness and light-headedness.  All  other systems reviewed and are negative.  Per HPI unless specifically indicated above   Allergies as of 09/13/2021   No Known Allergies      Medication List        Accurate as of September 13, 2021  4:50 PM. If you have any questions, ask your nurse or doctor.          amLODipine 5 MG tablet Commonly known as: NORVASC TAKE 1 TABLET BY MOUTH ONCE DAILY   aspirin EC 81 MG tablet Take 81 mg by mouth daily.   benzonatate 100 MG capsule Commonly known as: Tessalon Perles Take 1 capsule (100 mg total) by mouth 3 (three) times daily as needed.   Biotin 10000 MCG Tbdp Take 1 tablet by mouth daily.   busPIRone 15 MG tablet Commonly known as: BUSPAR TAKE 1 TABLET (15 MG TOTAL) BY MOUTH 2 (TWO) TIMES DAILY AS NEEDED.   CALCIUM-MAGNESIUM-ZINC-D3 PO Take 1 tablet by mouth daily at 6 (six) AM.   colchicine 0.6 MG tablet TAKE 1.2 MG (2 TABLETS) BY MOUTH ONCE, THEN TAKE 0.6 MG ONE HOUR LATER. USE AS NEEDED FOR GOUT FLARE. DO NOT REPEAT FOR AT LEAST 3 DAYS.   fenofibrate 48 MG tablet Commonly known as: TRICOR TAKE 1 TABLET (48 MG TOTAL) BY MOUTH DAILY.   fluticasone 50 MCG/ACT nasal spray Commonly known as: FLONASE Place 2 sprays into both nostrils daily.   FreeStyle Freedom Lite w/Device Kit Check BS once daily   FreeStyle Lite w/Device Kit CHECK BLOOD SUGAR ONCE DAILY   freestyle lancets CHECK BLOOD SUGAR ONCE DAILY  freestyle lancets SMARTSIG:Topical Daily   FREESTYLE LITE test strip Generic drug: glucose blood CHECK BLOOD SUGAR ONCE DAILY   glucose blood test strip SMARTSIG:Via Meter Daily   losartan 50 MG tablet Commonly known as: COZAAR TAKE 1 TABLET (50 MG TOTAL) BY MOUTH DAILY.   meloxicam 15 MG tablet Commonly known as: MOBIC Take 1 tablet (15 mg total) by mouth daily.   metFORMIN 500 MG tablet Commonly known as: GLUCOPHAGE TAKE 1 TABLET BY MOUTH DAILY WITH BREAKFAST   metoprolol succinate 25 MG 24 hr tablet Commonly known as:  TOPROL-XL TAKE 1 TABLET BY MOUTH ONCE DAILY   nitroGLYCERIN 0.4 MG SL tablet Commonly known as: NITROSTAT Place 1 tablet (0.4 mg total) under the tongue every 5 (five) minutes as needed for chest pain.   pantoprazole 40 MG tablet Commonly known as: PROTONIX TAKE 1 TABLET BY MOUTH ONCE DAILY   rosuvastatin 40 MG tablet Commonly known as: CRESTOR TAKE 1 TABLET (40 MG TOTAL) BY MOUTH AT BEDTIME.   Trulicity 1.5 TU/8.8KC Sopn Generic drug: Dulaglutide Inject 1.5 mg into the skin once a week.   Vascepa 1 g capsule Generic drug: icosapent Ethyl TAKE 2 CAPSULES BY MOUTH TWO TIMES DAILY **OFFICE VISIT NEEDED FOR FURTHER REFILLS**   VITAMIN D PO Take 10,000 mg by mouth daily at 6 (six) AM.   ZYRTEC ALLERGY PO Take 1 tablet by mouth daily at 6 (six) AM.         Objective:   BP 134/85   Pulse 68   Ht 5' 8" (1.727 m)   Wt 228 lb (103.4 kg)   SpO2 96%   BMI 34.67 kg/m   Wt Readings from Last 3 Encounters:  09/13/21 228 lb (103.4 kg)  07/25/21 232 lb 6.4 oz (105.4 kg)  06/07/21 238 lb (108 kg)    Physical Exam Vitals and nursing note reviewed.  Constitutional:      General: He is not in acute distress.    Appearance: He is well-developed. He is not diaphoretic.  Eyes:     General: No scleral icterus.    Conjunctiva/sclera: Conjunctivae normal.  Neck:     Thyroid: No thyromegaly.  Cardiovascular:     Rate and Rhythm: Normal rate and regular rhythm.     Heart sounds: Normal heart sounds. No murmur heard. Pulmonary:     Effort: Pulmonary effort is normal. No respiratory distress.     Breath sounds: Normal breath sounds. No wheezing.  Musculoskeletal:        General: Normal range of motion.     Cervical back: Neck supple.  Lymphadenopathy:     Cervical: No cervical adenopathy.  Skin:    General: Skin is warm and dry.     Findings: No rash.  Neurological:     Mental Status: He is alert and oriented to person, place, and time.     Coordination: Coordination  normal.  Psychiatric:        Behavior: Behavior normal.      Assessment & Plan:   Problem List Items Addressed This Visit       Cardiovascular and Mediastinum   HTN (hypertension) - Primary   Relevant Medications   fenofibrate (TRICOR) 48 MG tablet   losartan (COZAAR) 50 MG tablet     Endocrine   Type 2 diabetes mellitus with other specified complication (HCC)   Relevant Medications   losartan (COZAAR) 50 MG tablet   Other Relevant Orders   Bayer DCA Hb A1c Waived  Other   Hyperlipidemia   Relevant Medications   fenofibrate (TRICOR) 48 MG tablet   losartan (COZAAR) 50 MG tablet    Continue current medications, Will check A1c today and see how he does  Follow up plan: Return in about 3 months (around 12/14/2021), or if symptoms worsen or fail to improve, for Diabetes recheck.  Counseling provided for all of the vaccine components Orders Placed This Encounter  Procedures   Bayer [place] Hb A1c [place] Demaurion Dicioccio, MD [facility] 09/13/2021, 4:50 PM

## 2021-09-16 ENCOUNTER — Other Ambulatory Visit (HOSPITAL_BASED_OUTPATIENT_CLINIC_OR_DEPARTMENT_OTHER): Payer: Self-pay

## 2021-09-16 ENCOUNTER — Telehealth: Payer: Self-pay

## 2021-09-16 DIAGNOSIS — E1169 Type 2 diabetes mellitus with other specified complication: Secondary | ICD-10-CM

## 2021-09-16 LAB — BAYER DCA HB A1C WAIVED: HB A1C (BAYER DCA - WAIVED): 6.8 % — ABNORMAL HIGH (ref 4.8–5.6)

## 2021-09-16 MED ORDER — TRULICITY 3 MG/0.5ML ~~LOC~~ SOAJ
3.0000 mg | SUBCUTANEOUS | 0 refills | Status: AC
  Filled 2021-09-16 – 2021-09-27 (×2): qty 2, 28d supply, fill #0

## 2021-09-16 NOTE — Telephone Encounter (Signed)
Pt would like to try Trulicity 3mg  for one month.

## 2021-09-16 NOTE — Telephone Encounter (Signed)
Patient wife aware and verbalized understanding. 

## 2021-09-16 NOTE — Telephone Encounter (Signed)
Sent 1 month of Trulicity 3 mg

## 2021-09-18 ENCOUNTER — Other Ambulatory Visit: Payer: No Typology Code available for payment source

## 2021-09-18 DIAGNOSIS — I10 Essential (primary) hypertension: Secondary | ICD-10-CM

## 2021-09-18 DIAGNOSIS — E782 Mixed hyperlipidemia: Secondary | ICD-10-CM

## 2021-09-18 DIAGNOSIS — E1169 Type 2 diabetes mellitus with other specified complication: Secondary | ICD-10-CM

## 2021-09-18 LAB — BAYER DCA HB A1C WAIVED: HB A1C (BAYER DCA - WAIVED): 6.8 % — ABNORMAL HIGH (ref 4.8–5.6)

## 2021-09-19 LAB — CBC WITH DIFFERENTIAL/PLATELET
Basophils Absolute: 0 10*3/uL (ref 0.0–0.2)
Basos: 0 %
EOS (ABSOLUTE): 0.2 10*3/uL (ref 0.0–0.4)
Eos: 1 %
Hematocrit: 41.3 % (ref 37.5–51.0)
Hemoglobin: 14.7 g/dL (ref 13.0–17.7)
Immature Grans (Abs): 0 10*3/uL (ref 0.0–0.1)
Immature Granulocytes: 0 %
Lymphocytes Absolute: 1.5 10*3/uL (ref 0.7–3.1)
Lymphs: 12 %
MCH: 32.5 pg (ref 26.6–33.0)
MCHC: 35.6 g/dL (ref 31.5–35.7)
MCV: 91 fL (ref 79–97)
Monocytes Absolute: 1 10*3/uL — ABNORMAL HIGH (ref 0.1–0.9)
Monocytes: 8 %
Neutrophils Absolute: 9.5 10*3/uL — ABNORMAL HIGH (ref 1.4–7.0)
Neutrophils: 79 %
Platelets: 270 10*3/uL (ref 150–450)
RBC: 4.53 x10E6/uL (ref 4.14–5.80)
RDW: 12.8 % (ref 11.6–15.4)
WBC: 12.1 10*3/uL — ABNORMAL HIGH (ref 3.4–10.8)

## 2021-09-19 LAB — CMP14+EGFR
ALT: 17 IU/L (ref 0–44)
AST: 13 IU/L (ref 0–40)
Albumin/Globulin Ratio: 2.7 — ABNORMAL HIGH (ref 1.2–2.2)
Albumin: 4.8 g/dL (ref 3.8–4.9)
Alkaline Phosphatase: 66 IU/L (ref 44–121)
BUN/Creatinine Ratio: 16 (ref 9–20)
BUN: 15 mg/dL (ref 6–24)
Bilirubin Total: 0.6 mg/dL (ref 0.0–1.2)
CO2: 22 mmol/L (ref 20–29)
Calcium: 9.5 mg/dL (ref 8.7–10.2)
Chloride: 103 mmol/L (ref 96–106)
Creatinine, Ser: 0.94 mg/dL (ref 0.76–1.27)
Globulin, Total: 1.8 g/dL (ref 1.5–4.5)
Glucose: 150 mg/dL — ABNORMAL HIGH (ref 70–99)
Potassium: 4.2 mmol/L (ref 3.5–5.2)
Sodium: 140 mmol/L (ref 134–144)
Total Protein: 6.6 g/dL (ref 6.0–8.5)
eGFR: 96 mL/min/{1.73_m2} (ref >59)

## 2021-09-19 LAB — LIPID PANEL
Chol/HDL Ratio: 2.9 ratio (ref 0.0–5.0)
Cholesterol, Total: 100 mg/dL (ref 100–199)
HDL: 34 mg/dL — ABNORMAL LOW (ref >39)
LDL Chol Calc (NIH): 22 mg/dL (ref 0–99)
Triglycerides: 307 mg/dL — ABNORMAL HIGH (ref 0–149)
VLDL Cholesterol Cal: 44 mg/dL — ABNORMAL HIGH (ref 5–40)

## 2021-09-23 ENCOUNTER — Other Ambulatory Visit (HOSPITAL_BASED_OUTPATIENT_CLINIC_OR_DEPARTMENT_OTHER): Payer: Self-pay

## 2021-09-27 ENCOUNTER — Other Ambulatory Visit (HOSPITAL_BASED_OUTPATIENT_CLINIC_OR_DEPARTMENT_OTHER): Payer: Self-pay

## 2021-10-14 ENCOUNTER — Other Ambulatory Visit (HOSPITAL_BASED_OUTPATIENT_CLINIC_OR_DEPARTMENT_OTHER): Payer: Self-pay

## 2021-10-14 ENCOUNTER — Encounter: Payer: Self-pay | Admitting: Family Medicine

## 2021-10-14 MED ORDER — PREDNISONE 20 MG PO TABS
ORAL_TABLET | ORAL | 0 refills | Status: DC
  Filled 2021-10-14: qty 10, 5d supply, fill #0

## 2021-10-15 ENCOUNTER — Other Ambulatory Visit (HOSPITAL_BASED_OUTPATIENT_CLINIC_OR_DEPARTMENT_OTHER): Payer: Self-pay

## 2021-11-03 ENCOUNTER — Encounter: Payer: Self-pay | Admitting: Internal Medicine

## 2021-11-03 NOTE — Assessment & Plan Note (Signed)
Issues of hypertension with untreated OSA discussed as emphasis for continuing good CPAP compliance.

## 2021-11-03 NOTE — Assessment & Plan Note (Signed)
Benefits from CPAP with good compliance and control Plan- continue auto 10-20 

## 2021-11-06 ENCOUNTER — Other Ambulatory Visit (HOSPITAL_BASED_OUTPATIENT_CLINIC_OR_DEPARTMENT_OTHER): Payer: Self-pay

## 2021-11-19 ENCOUNTER — Ambulatory Visit (INDEPENDENT_AMBULATORY_CARE_PROVIDER_SITE_OTHER): Payer: No Typology Code available for payment source | Admitting: Family Medicine

## 2021-11-19 ENCOUNTER — Other Ambulatory Visit (HOSPITAL_BASED_OUTPATIENT_CLINIC_OR_DEPARTMENT_OTHER): Payer: Self-pay

## 2021-11-19 ENCOUNTER — Ambulatory Visit (INDEPENDENT_AMBULATORY_CARE_PROVIDER_SITE_OTHER): Payer: No Typology Code available for payment source

## 2021-11-19 ENCOUNTER — Encounter: Payer: Self-pay | Admitting: Family Medicine

## 2021-11-19 VITALS — BP 138/84 | HR 62 | Temp 97.9°F | Ht 68.0 in | Wt 230.6 lb

## 2021-11-19 DIAGNOSIS — R109 Unspecified abdominal pain: Secondary | ICD-10-CM

## 2021-11-19 DIAGNOSIS — M545 Low back pain, unspecified: Secondary | ICD-10-CM | POA: Diagnosis not present

## 2021-11-19 LAB — MICROSCOPIC EXAMINATION
Bacteria, UA: NONE SEEN
Epithelial Cells (non renal): NONE SEEN /hpf (ref 0–10)
RBC, Urine: NONE SEEN /hpf (ref 0–2)
Renal Epithel, UA: NONE SEEN /hpf
WBC, UA: NONE SEEN /hpf (ref 0–5)

## 2021-11-19 LAB — URINALYSIS, COMPLETE
Bilirubin, UA: NEGATIVE
Glucose, UA: NEGATIVE
Ketones, UA: NEGATIVE
Leukocytes,UA: NEGATIVE
Nitrite, UA: NEGATIVE
Protein,UA: NEGATIVE
RBC, UA: NEGATIVE
Specific Gravity, UA: 1.01 (ref 1.005–1.030)
Urobilinogen, Ur: 0.2 mg/dL (ref 0.2–1.0)
pH, UA: 7 (ref 5.0–7.5)

## 2021-11-19 MED ORDER — DICLOFENAC SODIUM 75 MG PO TBEC
75.0000 mg | DELAYED_RELEASE_TABLET | Freq: Two times a day (BID) | ORAL | 0 refills | Status: DC
  Filled 2021-11-19: qty 30, 15d supply, fill #0

## 2021-11-19 MED ORDER — TIZANIDINE HCL 2 MG PO TABS
2.0000 mg | ORAL_TABLET | Freq: Four times a day (QID) | ORAL | 0 refills | Status: DC | PRN
  Filled 2021-11-19: qty 30, 8d supply, fill #0

## 2021-11-19 MED ORDER — METHYLPREDNISOLONE ACETATE 80 MG/ML IJ SUSP
80.0000 mg | Freq: Once | INTRAMUSCULAR | Status: AC
  Administered 2021-11-19: 17:00:00 80 mg via INTRAMUSCULAR

## 2021-11-19 NOTE — Progress Notes (Signed)
Assessment & Plan:  1. Right flank pain - Urinalysis, Complete, negative for UTI or blood - DG Abd 1 View, negative for renal stones on the right side upon my review  2. Acute right-sided low back pain without sciatica - start diclofenac and tizanidine for symptom relief - steroid injection today in office - education provided on low back rehab - diclofenac (VOLTAREN) 75 MG EC tablet; Take 1 tablet (75 mg total) by mouth 2 (two) times daily.  Dispense: 30 tablet; Refill: 0 - tiZANidine (ZANAFLEX) 2 MG tablet; Take 1 tablet (2 mg total) by mouth every 6 (six) hours as needed for muscle spasms.  Dispense: 30 tablet; Refill: 0 - methylPREDNISolone acetate (DEPO-MEDROL) injection 80 mg   Follow up plan: Return if symptoms worsen or fail to improve.  Lucile Crater, NP Student  I personally was present during the history, physical exam, and medical decision-making activities of this service and have verified that the service and findings are accurately documented in the nurse practitioner student's note.  Hendricks Limes, MSN, APRN, FNP-C [facility]  Subjective:   Patient ID: Jimmy Craft., male    DOB: [birth year], 56 y.o.   MRN: [id number]  HPI: Jimmy Perez. is a 56 y.o. male presenting on 11/19/2021 for Back Pain (Right sided back pain x 3 days )  Patient states he has chronic low back pain that has worsened on the right side of his back the last 3 days. He states he has been helping his daughter move houses and has been lifting a lot of heavy furniture. He states that the pain is usually about a 4 in the mornings and progresses to an 8 as the day progresses. He is concerned that he might have a kidney stone as the last time he did, he had similar symptoms and thought it was a pulled muscle for 3 weeks. He states that the pain is somewhat alleviated when he has a bowel movement, which he reports is not regular. He states he has taken one of his wife's  Flexeril pills which helped somewhat.    ROS: Negative unless specifically indicated above in HPI.   Relevant past medical history reviewed and updated as indicated.   Allergies and medications reviewed and updated.   Current Outpatient Medications:    amLODipine (NORVASC) 5 MG tablet, TAKE 1 TABLET BY MOUTH ONCE DAILY, Disp: 90 tablet, Rfl: 3   aspirin EC 81 MG tablet, Take 81 mg by mouth daily., Disp: , Rfl:    benzonatate (TESSALON PERLES) 100 MG capsule, Take 1 capsule (100 mg total) by mouth 3 (three) times daily as needed., Disp: 20 capsule, Rfl: 0   Biotin 10000 MCG TBDP, Take 1 tablet by mouth daily., Disp: , Rfl:    Blood Glucose Monitoring Suppl (FREESTYLE FREEDOM LITE) w/Device KIT, Check BS once daily, Disp: 1 kit, Rfl: 0   Blood Glucose Monitoring Suppl (FREESTYLE LITE) w/Device KIT, CHECK BLOOD SUGAR ONCE DAILY, Disp: 1 kit, Rfl: 0   busPIRone (BUSPAR) 15 MG tablet, TAKE 1 TABLET (15 MG TOTAL) BY MOUTH 2 (TWO) TIMES DAILY AS NEEDED., Disp: 180 tablet, Rfl: 3   Cetirizine HCl (ZYRTEC ALLERGY PO), Take 1 tablet by mouth daily at 6 (six) AM., Disp: , Rfl:    colchicine 0.6 MG tablet, TAKE 1.2 MG (2 TABLETS) BY MOUTH ONCE, THEN TAKE 0.6 MG ONE HOUR LATER. USE AS NEEDED FOR GOUT FLARE. DO NOT REPEAT FOR AT LEAST 3 DAYS., Disp:  9 tablet, Rfl: 2   fenofibrate (TRICOR) 48 MG tablet, TAKE 1 TABLET (48 MG TOTAL) BY MOUTH DAILY., Disp: 90 tablet, Rfl: 3   fluticasone (FLONASE) 50 MCG/ACT nasal spray, Place 2 sprays into both nostrils daily., Disp: 16 g, Rfl: 6   glucose blood test strip, CHECK BLOOD SUGAR ONCE DAILY (Patient taking differently: CHECK BLOOD SUGAR ONCE DAILY), Disp: 100 strip, Rfl: 12   glucose blood test strip, SMARTSIG:Via Meter Daily, Disp: , Rfl:    icosapent Ethyl (VASCEPA) 1 g capsule, TAKE 2 CAPSULES BY MOUTH TWO TIMES DAILY **OFFICE VISIT NEEDED FOR FURTHER REFILLS**, Disp: 360 capsule, Rfl: 3   Lancets (FREESTYLE) lancets, CHECK BLOOD SUGAR ONCE DAILY (Patient  taking differently: CHECK BLOOD SUGAR ONCE DAILY), Disp: 100 each, Rfl: 12   Lancets (FREESTYLE) lancets, SMARTSIG:Topical Daily, Disp: , Rfl:    losartan (COZAAR) 50 MG tablet, TAKE 1 TABLET (50 MG TOTAL) BY MOUTH DAILY., Disp: 90 tablet, Rfl: 3   meloxicam (MOBIC) 15 MG tablet, Take 1 tablet (15 mg total) by mouth daily., Disp: 14 tablet, Rfl: 0   metFORMIN (GLUCOPHAGE) 500 MG tablet, TAKE 1 TABLET BY MOUTH DAILY WITH BREAKFAST, Disp: 90 tablet, Rfl: 3   metoprolol succinate (TOPROL-XL) 25 MG 24 hr tablet, TAKE 1 TABLET BY MOUTH ONCE DAILY, Disp: 90 tablet, Rfl: 3   Multiple Minerals-Vitamins (CALCIUM-MAGNESIUM-ZINC-D3 PO), Take 1 tablet by mouth daily at 6 (six) AM., Disp: , Rfl:    nitroGLYCERIN (NITROSTAT) 0.4 MG SL tablet, Place 1 tablet (0.4 mg total) under the tongue every 5 (five) minutes as needed for chest pain., Disp: 25 tablet, Rfl: 1   pantoprazole (PROTONIX) 40 MG tablet, TAKE 1 TABLET BY MOUTH ONCE DAILY, Disp: 90 tablet, Rfl: 3   predniSONE (DELTASONE) 20 MG tablet, Take 2 tablets by mouth daily at the same time for 5 days, Disp: 10 tablet, Rfl: 0   rosuvastatin (CRESTOR) 40 MG tablet, TAKE 1 TABLET (40 MG TOTAL) BY MOUTH AT BEDTIME., Disp: 90 tablet, Rfl: 3   VITAMIN D PO, Take 10,000 mg by mouth daily at 6 (six) AM., Disp: , Rfl:   No Known Allergies  Objective:   BP 138/84    Pulse 62    Temp 97.9 F (36.6 C) (Temporal)    Ht '5\' 8"'  (1.727 m)    Wt 230 lb 9.6 oz (104.6 kg)    SpO2 95%    BMI 35.06 kg/m    Physical Exam Vitals reviewed.  Constitutional:      General: He is not in acute distress.    Appearance: Normal appearance. He is not ill-appearing, toxic-appearing or diaphoretic.  HENT:     Head: Normocephalic and atraumatic.  Eyes:     General: No scleral icterus.       Right eye: No discharge.        Left eye: No discharge.     Conjunctiva/sclera: Conjunctivae normal.  Cardiovascular:     Rate and Rhythm: Normal rate and regular rhythm.     Heart sounds:  Normal heart sounds. No murmur heard.   No friction rub. No gallop.  Pulmonary:     Effort: Pulmonary effort is normal. No respiratory distress.     Breath sounds: Normal breath sounds. No stridor. No wheezing, rhonchi or rales.  Abdominal:     Palpations: Abdomen is soft.  Musculoskeletal:        General: Tenderness (right lower back) present. Normal range of motion.     Cervical back: Normal range  of motion.     Right lower leg: No edema.     Left lower leg: No edema.  Skin:    General: Skin is warm and dry.  Neurological:     Mental Status: He is alert and oriented to person, place, and time. Mental status is at baseline.  Psychiatric:        Mood and Affect: Mood normal.        Behavior: Behavior normal.        Thought Content: Thought content normal.        Judgment: Judgment normal.

## 2021-11-25 ENCOUNTER — Encounter: Payer: Self-pay | Admitting: Family Medicine

## 2021-11-27 ENCOUNTER — Other Ambulatory Visit (HOSPITAL_BASED_OUTPATIENT_CLINIC_OR_DEPARTMENT_OTHER): Payer: Self-pay

## 2021-11-27 MED ORDER — TRIAMCINOLONE ACETONIDE 0.1 % EX CREA
TOPICAL_CREAM | CUTANEOUS | 0 refills | Status: AC
  Filled 2021-11-27: qty 60, 14d supply, fill #0

## 2021-12-03 ENCOUNTER — Other Ambulatory Visit (HOSPITAL_BASED_OUTPATIENT_CLINIC_OR_DEPARTMENT_OTHER): Payer: Self-pay

## 2021-12-03 ENCOUNTER — Other Ambulatory Visit: Payer: Self-pay | Admitting: Family Medicine

## 2021-12-03 MED ORDER — LOSARTAN POTASSIUM 50 MG PO TABS
ORAL_TABLET | Freq: Every day | ORAL | 0 refills | Status: DC
  Filled 2021-12-03: qty 90, 90d supply, fill #0

## 2021-12-04 ENCOUNTER — Other Ambulatory Visit (HOSPITAL_BASED_OUTPATIENT_CLINIC_OR_DEPARTMENT_OTHER): Payer: Self-pay

## 2021-12-13 ENCOUNTER — Ambulatory Visit: Payer: No Typology Code available for payment source | Admitting: Family Medicine

## 2021-12-19 ENCOUNTER — Other Ambulatory Visit: Payer: Self-pay

## 2021-12-19 DIAGNOSIS — E1169 Type 2 diabetes mellitus with other specified complication: Secondary | ICD-10-CM

## 2021-12-19 DIAGNOSIS — E782 Mixed hyperlipidemia: Secondary | ICD-10-CM

## 2021-12-19 DIAGNOSIS — I10 Essential (primary) hypertension: Secondary | ICD-10-CM

## 2021-12-20 ENCOUNTER — Other Ambulatory Visit: Payer: Self-pay

## 2021-12-20 ENCOUNTER — Encounter: Payer: Self-pay | Admitting: Family Medicine

## 2021-12-20 ENCOUNTER — Ambulatory Visit (INDEPENDENT_AMBULATORY_CARE_PROVIDER_SITE_OTHER): Payer: No Typology Code available for payment source | Admitting: Family Medicine

## 2021-12-20 ENCOUNTER — Other Ambulatory Visit (HOSPITAL_BASED_OUTPATIENT_CLINIC_OR_DEPARTMENT_OTHER): Payer: Self-pay

## 2021-12-20 VITALS — BP 138/72 | HR 80 | Ht 68.0 in | Wt 229.0 lb

## 2021-12-20 DIAGNOSIS — I10 Essential (primary) hypertension: Secondary | ICD-10-CM

## 2021-12-20 DIAGNOSIS — E782 Mixed hyperlipidemia: Secondary | ICD-10-CM

## 2021-12-20 DIAGNOSIS — E1169 Type 2 diabetes mellitus with other specified complication: Secondary | ICD-10-CM | POA: Diagnosis not present

## 2021-12-20 LAB — BAYER DCA HB A1C WAIVED: HB A1C (BAYER DCA - WAIVED): 7 % — ABNORMAL HIGH (ref 4.8–5.6)

## 2021-12-20 LAB — LIPID PANEL

## 2021-12-20 MED ORDER — METFORMIN HCL 500 MG PO TABS
ORAL_TABLET | Freq: Every day | ORAL | 3 refills | Status: DC
  Filled 2021-12-20: qty 90, fill #0
  Filled 2022-03-06: qty 90, 90d supply, fill #0
  Filled 2022-06-04: qty 90, 90d supply, fill #1
  Filled 2022-08-28: qty 90, 90d supply, fill #2
  Filled 2022-11-27: qty 90, 90d supply, fill #0

## 2021-12-20 MED ORDER — TRULICITY 3 MG/0.5ML ~~LOC~~ SOAJ
3.0000 mg | SUBCUTANEOUS | 3 refills | Status: DC
  Filled 2021-12-20: qty 6, 84d supply, fill #0
  Filled 2022-03-13: qty 6, 84d supply, fill #1
  Filled 2022-06-04: qty 6, 84d supply, fill #2

## 2021-12-20 NOTE — Progress Notes (Signed)
BP 138/72    Pulse 80    Ht 5\' 8"  (1.727 m)    Wt 229 lb (103.9 kg)    SpO2 98%    BMI 34.82 kg/m    Subjective:   Patient ID: Jimmy Perez., male    DOB: Feb 14, 1966, 56 y.o.   MRN: [id number]  HPI: Jimmy Perez. is a 56 y.o. male presenting on 12/20/2021 for Medical Management of Chronic Issues, Diabetes, Hypertension, and Hyperlipidemia   HPI Type 2 diabetes mellitus Patient comes in today for recheck of his diabetes. Patient has been currently taking metformin. Patient is currently on an ACE inhibitor/ARB. Patient has not seen an ophthalmologist this year. Patient denies any issues with their feet. The symptom started onset as an adult hypertension and hld ARE RELATED TO DM   Hypertension Patient is currently on losartan and metoprolol, and their blood pressure today is 138/72. Patient denies any lightheadedness or dizziness. Patient denies headaches, blurred vision, chest pains, shortness of breath, or weakness. Denies any side effects from medication and is content with current medication.   Hyperlipidemia Patient is coming in for recheck of his hyperlipidemia. The patient is currently taking crestor. They deny any issues with myalgias or history of liver damage from it. They deny any focal numbness or weakness or chest pain.   Relevant past medical, surgical, family and social history reviewed and updated as indicated. Interim medical history since our last visit reviewed. Allergies and medications reviewed and updated.  Review of Systems  Constitutional:  Negative for chills and fever.  Eyes:  Negative for visual disturbance.  Respiratory:  Negative for shortness of breath and wheezing.   Cardiovascular:  Negative for chest pain and leg swelling.  Musculoskeletal:  Negative for back pain and gait problem.  Skin:  Negative for rash.  Neurological:  Negative for dizziness, weakness and light-headedness.  All other systems reviewed and are negative.  Per HPI  unless specifically indicated above   Allergies as of 12/20/2021   No Known Allergies      Medication List        Accurate as of December 20, 2021  4:52 PM. If you have any questions, ask your nurse or doctor.          STOP taking these medications    Trulicity 9.38 HW/2.9HB Sopn Generic drug: Dulaglutide Replaced by: Trulicity 3 ZJ/6.9CV Sopn Stopped by: Fransisca Kaufmann Derreck Wiltsey, MD       TAKE these medications    amLODipine 5 MG tablet Commonly known as: NORVASC TAKE 1 TABLET BY MOUTH ONCE DAILY   aspirin EC 81 MG tablet Take 81 mg by mouth daily.   busPIRone 15 MG tablet Commonly known as: BUSPAR TAKE 1 TABLET (15 MG TOTAL) BY MOUTH 2 (TWO) TIMES DAILY AS NEEDED.   CALCIUM-MAGNESIUM-ZINC-D3 PO Take 1 tablet by mouth daily at 6 (six) AM.   colchicine 0.6 MG tablet TAKE 1.2 MG (2 TABLETS) BY MOUTH ONCE, THEN TAKE 0.6 MG ONE HOUR LATER. USE AS NEEDED FOR GOUT FLARE. DO NOT REPEAT FOR AT LEAST 3 DAYS.   diclofenac 75 MG EC tablet Commonly known as: VOLTAREN Take 1 tablet (75 mg total) by mouth 2 (two) times daily.   fenofibrate 48 MG tablet Commonly known as: TRICOR TAKE 1 TABLET (48 MG TOTAL) BY MOUTH DAILY.   fluticasone 50 MCG/ACT nasal spray Commonly known as: FLONASE Place 2 sprays into both nostrils daily.   freestyle lancets SMARTSIG:Topical Daily   glucose  blood test strip SMARTSIG:Via Meter Daily   losartan 50 MG tablet Commonly known as: COZAAR TAKE 1 TABLET (50 MG TOTAL) BY MOUTH DAILY.   metFORMIN 500 MG tablet Commonly known as: GLUCOPHAGE TAKE 1 TABLET BY MOUTH DAILY WITH BREAKFAST   metoprolol succinate 25 MG 24 hr tablet Commonly known as: TOPROL-XL TAKE 1 TABLET BY MOUTH ONCE DAILY   nitroGLYCERIN 0.4 MG SL tablet Commonly known as: NITROSTAT Place 1 tablet (0.4 mg total) under the tongue every 5 (five) minutes as needed for chest pain.   pantoprazole 40 MG tablet Commonly known as: PROTONIX TAKE 1 TABLET BY MOUTH ONCE  DAILY   rosuvastatin 40 MG tablet Commonly known as: CRESTOR TAKE 1 TABLET (40 MG TOTAL) BY MOUTH AT BEDTIME.   tiZANidine 2 MG tablet Commonly known as: ZANAFLEX Take 1 tablet (2 mg total) by mouth every 6 (six) hours as needed for muscle spasms.   triamcinolone cream 0.1 % Commonly known as: KENALOG Apply to affected area(s) of skin twice daily for 14 days. (apply to skin bid 14 days)   Trulicity 3 DX/4.1OI Sopn Generic drug: Dulaglutide Inject 3 mg as directed once a week. Replaces: Trulicity 7.86 VE/7.2CN Sopn Started by: Fransisca Kaufmann Zaevion Parke, MD   Vascepa 1 g capsule Generic drug: icosapent Ethyl TAKE 2 CAPSULES BY MOUTH TWO TIMES DAILY **OFFICE VISIT NEEDED FOR FURTHER REFILLS**   ZYRTEC ALLERGY PO Take 1 tablet by mouth daily at 6 (six) AM.         Objective:   BP 138/72    Pulse 80    Ht 5\' 8"  (1.727 m)    Wt 229 lb (103.9 kg)    SpO2 98%    BMI 34.82 kg/m   Wt Readings from Last 3 Encounters:  12/20/21 229 lb (103.9 kg)  11/19/21 230 lb 9.6 oz (104.6 kg)  09/13/21 228 lb (103.4 kg)    Physical Exam Vitals and nursing note reviewed.  Constitutional:      General: He is not in acute distress.    Appearance: He is well-developed. He is not diaphoretic.  Eyes:     General: No scleral icterus.    Conjunctiva/sclera: Conjunctivae normal.  Neck:     Thyroid: No thyromegaly.  Cardiovascular:     Rate and Rhythm: Normal rate and regular rhythm.     Heart sounds: Normal heart sounds. No murmur heard. Pulmonary:     Effort: Pulmonary effort is normal. No respiratory distress.     Breath sounds: Normal breath sounds. No wheezing.  Musculoskeletal:        General: Normal range of motion.     Cervical back: Neck supple.  Lymphadenopathy:     Cervical: No cervical adenopathy.  Skin:    General: Skin is warm and dry.     Findings: No rash.  Neurological:     Mental Status: He is alert and oriented to person, place, and time.     Coordination: Coordination  normal.  Psychiatric:        Behavior: Behavior normal.    Results for orders placed or performed in visit on 12/20/21  Bayer DCA Hb A1c Waived  Result Value Ref Range   HB A1C (BAYER DCA - WAIVED) 7.0 (H) 4.8 - 5.6 %    Assessment & Plan:   Problem List Items Addressed This Visit       Cardiovascular and Mediastinum   HTN (hypertension)     Endocrine   Type 2 diabetes mellitus with other  specified complication ([place]) - Primary   Relevant Medications   metFORMIN (GLUCOPHAGE) 500 MG tablet   Dulaglutide (TRULICITY) 3 BV/1.3WU SOPN     Other   Hyperlipidemia    A1c 7.0, will increase Trulicity 3.0 because he wants to try for more weight loss Follow up plan: Return in about 3 months (around 03/19/2022), or if symptoms worsen or fail to improve, for Diabetes and hypertension.  Counseling provided for all of the vaccine components No orders of the defined types were placed in this encounter.   Caryl Pina, MD [facility] 12/20/2021, 4:52 PM

## 2021-12-22 LAB — PSA, TOTAL AND FREE
PSA, Free Pct: 53.3 %
PSA, Free: 0.16 ng/mL
Prostate Specific Ag, Serum: 0.3 ng/mL (ref 0.0–4.0)

## 2021-12-22 LAB — LIPID PANEL
Chol/HDL Ratio: 3.1 ratio (ref 0.0–5.0)
Cholesterol, Total: 108 mg/dL (ref 100–199)
HDL: 35 mg/dL — ABNORMAL LOW (ref >39)
LDL Chol Calc (NIH): 27 mg/dL (ref 0–99)
Triglycerides: 314 mg/dL — ABNORMAL HIGH (ref 0–149)
VLDL Cholesterol Cal: 46 mg/dL — ABNORMAL HIGH (ref 5–40)

## 2021-12-22 LAB — CBC WITH DIFFERENTIAL/PLATELET
Basophils Absolute: 0.1 10*3/uL (ref 0.0–0.2)
Basos: 1 %
EOS (ABSOLUTE): 0.2 10*3/uL (ref 0.0–0.4)
Eos: 4 %
Hematocrit: 41.5 % (ref 37.5–51.0)
Hemoglobin: 14.2 g/dL (ref 13.0–17.7)
Immature Grans (Abs): 0 10*3/uL (ref 0.0–0.1)
Immature Granulocytes: 1 %
Lymphocytes Absolute: 2 10*3/uL (ref 0.7–3.1)
Lymphs: 31 %
MCH: 31.6 pg (ref 26.6–33.0)
MCHC: 34.2 g/dL (ref 31.5–35.7)
MCV: 92 fL (ref 79–97)
Monocytes Absolute: 0.6 10*3/uL (ref 0.1–0.9)
Monocytes: 10 %
Neutrophils Absolute: 3.5 10*3/uL (ref 1.4–7.0)
Neutrophils: 53 %
Platelets: 245 10*3/uL (ref 150–450)
RBC: 4.49 x10E6/uL (ref 4.14–5.80)
RDW: 13.1 % (ref 11.6–15.4)
WBC: 6.5 10*3/uL (ref 3.4–10.8)

## 2021-12-22 LAB — CMP14+EGFR
ALT: 22 IU/L (ref 0–44)
AST: 14 IU/L (ref 0–40)
Albumin/Globulin Ratio: 2.5 — ABNORMAL HIGH (ref 1.2–2.2)
Albumin: 4.5 g/dL (ref 3.8–4.9)
Alkaline Phosphatase: 69 IU/L (ref 44–121)
BUN/Creatinine Ratio: 14 (ref 9–20)
BUN: 12 mg/dL (ref 6–24)
Bilirubin Total: 0.5 mg/dL (ref 0.0–1.2)
CO2: 22 mmol/L (ref 20–29)
Calcium: 9.7 mg/dL (ref 8.7–10.2)
Chloride: 106 mmol/L (ref 96–106)
Creatinine, Ser: 0.86 mg/dL (ref 0.76–1.27)
Globulin, Total: 1.8 g/dL (ref 1.5–4.5)
Glucose: 148 mg/dL — ABNORMAL HIGH (ref 70–99)
Potassium: 4.1 mmol/L (ref 3.5–5.2)
Sodium: 143 mmol/L (ref 134–144)
Total Protein: 6.3 g/dL (ref 6.0–8.5)
eGFR: 102 mL/min/{1.73_m2} (ref >59)

## 2021-12-22 LAB — VITAMIN D 25 HYDROXY (VIT D DEFICIENCY, FRACTURES): Vit D, 25-Hydroxy: 49.5 ng/mL (ref 30.0–100.0)

## 2021-12-25 ENCOUNTER — Other Ambulatory Visit (HOSPITAL_BASED_OUTPATIENT_CLINIC_OR_DEPARTMENT_OTHER): Payer: Self-pay

## 2021-12-25 ENCOUNTER — Other Ambulatory Visit: Payer: Self-pay | Admitting: Family Medicine

## 2021-12-25 MED ORDER — FENOFIBRATE 145 MG PO TABS
145.0000 mg | ORAL_TABLET | Freq: Every day | ORAL | 1 refills | Status: DC
  Filled 2021-12-25: qty 90, 90d supply, fill #0
  Filled 2022-04-04: qty 90, 90d supply, fill #1

## 2022-02-03 ENCOUNTER — Telehealth: Payer: Self-pay

## 2022-02-03 NOTE — Telephone Encounter (Signed)
-----   Message from Worthy Rancher, MD sent at 02/02/2022  9:55 PM EDT ----- ?Regarding: FW: research ?Please forward the patient's and let him know about the study, I have no information about the study whether it is good or bad but it is something they can look into further. ?----- Message ----- ?From: Dorthey Sawyer, RN ?Sent: 01/31/2022   9:28 AM EDT ?To: Patton Salles, RN, # ?Subject: research                                      ? ?Dr.Dettinger  ?Your Patient Takahiro Postle may qualify for a research study, called Essence. It is a randomized, double -blind, placebo-controlled, phase 3 study of Delevan [phone] in patients with hypertriglyceridemia and atherosclerotic cardiovascular disease (established or at Increased Risk for), or with severe hypertriglyceridemia. Dr Debara Pickett is the PI for this study. If you feel this may be an option for your patient let me know either way. Fill free to contact me with any questions.  ? ?Beverly Gust, RN, BSN, RN-BC ?Research Coordinator ?[facility] ?Email: [email or web address] ?Office number: [phone] ?Fax: [phone] ? ? ? ?

## 2022-02-11 ENCOUNTER — Telehealth: Payer: Self-pay | Admitting: *Deleted

## 2022-02-11 DIAGNOSIS — E1169 Type 2 diabetes mellitus with other specified complication: Secondary | ICD-10-CM

## 2022-02-11 NOTE — Telephone Encounter (Signed)
Key: [id number] ?Drug ?Trulicity '3MG'$ /0.5ML pen-injectors ? ?Additional Information Required ?Member should be able to get the drug/product without a PA at this time. ? ?Will need to wait and attempt PA in about 1 month from now  ?

## 2022-03-06 ENCOUNTER — Other Ambulatory Visit: Payer: Self-pay | Admitting: *Deleted

## 2022-03-06 ENCOUNTER — Other Ambulatory Visit (HOSPITAL_BASED_OUTPATIENT_CLINIC_OR_DEPARTMENT_OTHER): Payer: Self-pay

## 2022-03-06 MED ORDER — LOSARTAN POTASSIUM 50 MG PO TABS
ORAL_TABLET | Freq: Every day | ORAL | 1 refills | Status: DC
Start: 2022-03-06 — End: 2022-04-04
  Filled 2022-03-06: qty 90, 90d supply, fill #0

## 2022-03-07 ENCOUNTER — Telehealth: Payer: Self-pay

## 2022-03-07 NOTE — Telephone Encounter (Signed)
Recall EUS for abnormal stomach with Dr Ardis Hughs  ? ?Waiting for August schedule ?

## 2022-03-13 ENCOUNTER — Other Ambulatory Visit (HOSPITAL_BASED_OUTPATIENT_CLINIC_OR_DEPARTMENT_OTHER): Payer: Self-pay

## 2022-03-13 ENCOUNTER — Other Ambulatory Visit: Payer: Self-pay

## 2022-03-13 DIAGNOSIS — R933 Abnormal findings on diagnostic imaging of other parts of digestive tract: Secondary | ICD-10-CM

## 2022-03-13 NOTE — Telephone Encounter (Signed)
EUS scheduled, pt instructed and medications reviewed.  Patient instructions mailed to home.  Patient to call with any questions or concerns.  

## 2022-03-13 NOTE — Telephone Encounter (Signed)
EUS scheduled for 7/20 at  [facility] with DJ at 730 am   Left message on machine to call back

## 2022-03-14 ENCOUNTER — Other Ambulatory Visit (HOSPITAL_BASED_OUTPATIENT_CLINIC_OR_DEPARTMENT_OTHER): Payer: Self-pay

## 2022-03-19 NOTE — Telephone Encounter (Signed)
Patient spouse called regarding scheduled colonoscopy at [facility] 05/15/22. Per spouse, wants to know why patient is having it done at hospital and not [facility]. I explained to spouse of reasons why patients may be scheduled at hospital. Per spouse, "I do not want a hospital bill, I'd rather my husband have procedure at [facility]." She is requesting a call from nurse to explain. Please advise.

## 2022-03-19 NOTE — Telephone Encounter (Signed)
The pt has been advised that the EUS is always done at the hospital.  This is not a test that can be done in the [facility].  He says he will speak to his wife and call back with any other questions

## 2022-03-26 ENCOUNTER — Telehealth: Payer: Self-pay

## 2022-03-26 NOTE — Telephone Encounter (Signed)
The pt cancelled the procedure that was set up for 7/20 and does not wish to proceed at this time.

## 2022-03-26 NOTE — Telephone Encounter (Signed)
-----   Message from Timothy Lasso, RN sent at 03/19/2022  2:26 PM EDT ----- Call pt when August schedule comes out

## 2022-03-31 ENCOUNTER — Encounter: Payer: Self-pay | Admitting: *Deleted

## 2022-03-31 DIAGNOSIS — Z006 Encounter for examination for normal comparison and control in clinical research program: Secondary | ICD-10-CM

## 2022-03-31 NOTE — Research (Signed)
Spoke with Mr Milliron about essence research. States he would like more information. Emailed him a copy of the consent to review.

## 2022-04-02 LAB — HM DIABETES EYE EXAM

## 2022-04-03 ENCOUNTER — Encounter: Payer: Self-pay | Admitting: *Deleted

## 2022-04-03 ENCOUNTER — Other Ambulatory Visit (HOSPITAL_BASED_OUTPATIENT_CLINIC_OR_DEPARTMENT_OTHER): Payer: Self-pay

## 2022-04-03 DIAGNOSIS — Z006 Encounter for examination for normal comparison and control in clinical research program: Secondary | ICD-10-CM

## 2022-04-03 NOTE — Research (Signed)
I followed up with patient on Essence Study. Patient stated has not had a chance to read the consent but hopes to read it this week-end. Patient will call us back with any questions.

## 2022-04-04 ENCOUNTER — Other Ambulatory Visit: Payer: Self-pay

## 2022-04-04 ENCOUNTER — Encounter: Payer: Self-pay | Admitting: Family Medicine

## 2022-04-04 ENCOUNTER — Ambulatory Visit (INDEPENDENT_AMBULATORY_CARE_PROVIDER_SITE_OTHER): Payer: No Typology Code available for payment source | Admitting: Family Medicine

## 2022-04-04 ENCOUNTER — Other Ambulatory Visit (HOSPITAL_BASED_OUTPATIENT_CLINIC_OR_DEPARTMENT_OTHER): Payer: Self-pay

## 2022-04-04 VITALS — BP 136/83 | HR 109 | Temp 98.0°F | Ht 68.0 in | Wt 225.0 lb

## 2022-04-04 DIAGNOSIS — I10 Essential (primary) hypertension: Secondary | ICD-10-CM

## 2022-04-04 DIAGNOSIS — E782 Mixed hyperlipidemia: Secondary | ICD-10-CM | POA: Diagnosis not present

## 2022-04-04 DIAGNOSIS — E1169 Type 2 diabetes mellitus with other specified complication: Secondary | ICD-10-CM

## 2022-04-04 DIAGNOSIS — K219 Gastro-esophageal reflux disease without esophagitis: Secondary | ICD-10-CM | POA: Diagnosis not present

## 2022-04-04 LAB — BAYER DCA HB A1C WAIVED: HB A1C (BAYER DCA - WAIVED): 6.4 % — ABNORMAL HIGH (ref 4.8–5.6)

## 2022-04-04 MED ORDER — LOSARTAN POTASSIUM 50 MG PO TABS
50.0000 mg | ORAL_TABLET | Freq: Every day | ORAL | 3 refills | Status: DC
  Filled 2022-04-04 – 2022-06-04 (×2): qty 90, 90d supply, fill #0
  Filled 2022-08-28: qty 90, 90d supply, fill #1
  Filled 2022-11-27: qty 90, 90d supply, fill #0

## 2022-04-04 MED ORDER — ROSUVASTATIN CALCIUM 40 MG PO TABS
40.0000 mg | ORAL_TABLET | Freq: Every day | ORAL | 3 refills | Status: DC
  Filled 2022-04-04 – 2022-10-02 (×3): qty 90, 90d supply, fill #0

## 2022-04-04 MED ORDER — METOPROLOL SUCCINATE ER 25 MG PO TB24
25.0000 mg | ORAL_TABLET | Freq: Every day | ORAL | 3 refills | Status: DC
  Filled 2022-04-04 – 2022-06-04 (×2): qty 90, 90d supply, fill #0
  Filled 2022-08-28: qty 90, 90d supply, fill #1
  Filled 2022-11-27: qty 90, 90d supply, fill #0

## 2022-04-04 MED ORDER — AMLODIPINE BESYLATE 5 MG PO TABS
ORAL_TABLET | Freq: Every day | ORAL | 3 refills | Status: DC
  Filled 2022-04-04: qty 90, fill #0
  Filled 2022-06-04: qty 90, 90d supply, fill #0
  Filled 2022-08-28: qty 90, 90d supply, fill #1
  Filled 2022-11-27: qty 90, 90d supply, fill #0

## 2022-04-04 MED ORDER — PANTOPRAZOLE SODIUM 40 MG PO TBEC
DELAYED_RELEASE_TABLET | Freq: Every day | ORAL | 3 refills | Status: DC
  Filled 2022-04-04: qty 90, fill #0
  Filled 2022-06-04: qty 90, 90d supply, fill #0
  Filled 2022-08-28: qty 90, 90d supply, fill #1
  Filled 2022-11-27: qty 90, 90d supply, fill #0

## 2022-04-04 MED ORDER — FENOFIBRATE 145 MG PO TABS
145.0000 mg | ORAL_TABLET | Freq: Every day | ORAL | 3 refills | Status: DC
  Filled 2022-04-04 – 2022-10-02 (×3): qty 90, 90d supply, fill #0

## 2022-04-04 MED ORDER — BUSPIRONE HCL 15 MG PO TABS
ORAL_TABLET | ORAL | 3 refills | Status: DC
  Filled 2022-04-04: qty 180, fill #0
  Filled 2022-10-02: qty 180, 90d supply, fill #0
  Filled 2023-03-24: qty 180, 90d supply, fill #1

## 2022-04-04 MED ORDER — ICOSAPENT ETHYL 1 G PO CAPS
2.0000 g | ORAL_CAPSULE | Freq: Two times a day (BID) | ORAL | 3 refills | Status: DC
  Filled 2022-04-04 – 2022-10-02 (×3): qty 360, 90d supply, fill #0

## 2022-04-04 NOTE — Progress Notes (Signed)
BP 136/83   Pulse (!) 109   Temp 98 F (36.7 C)   Ht '5\' 8"'$  (1.727 m)   Wt 225 lb (102.1 kg)   SpO2 93%   BMI 34.21 kg/m    Subjective:   Patient ID: Jimmy Loretha Stapler., male    DOB: 03/26/1966, 56 y.o.   MRN: [id number]  HPI: Jimmy Suhaas Agena. is a 56 y.o. male presenting on 04/04/2022 for Medical Management of Chronic Issues, Hyperlipidemia, Hypertension, and Diabetes   HPI Type 2 diabetes mellitus Patient comes in today for recheck of his diabetes. Patient has been currently taking metformin and Trulicity. Patient is currently on an ACE inhibitor/ARB. Patient has not seen an ophthalmologist this year. Patient denies any issues with their feet. The symptom started onset as an adult attention hyperlipidemia ARE RELATED TO DM   Hypertension Patient is currently on amlodipine losartan and metoprolol, and their blood pressure today is 136/83. Patient denies any lightheadedness or dizziness. Patient denies headaches, blurred vision, chest pains, shortness of breath, or weakness. Denies any side effects from medication and is content with current medication.   Hyperlipidemia Patient is coming in for recheck of his hyperlipidemia. The patient is currently taking Crestor and Vascepa and fenofibrate. They deny any issues with myalgias or history of liver damage from it. They deny any focal numbness or weakness or chest pain.   Relevant past medical, surgical, family and social history reviewed and updated as indicated. Interim medical history since our last visit reviewed. Allergies and medications reviewed and updated.  Review of Systems  Constitutional:  Negative for chills and fever.  Eyes:  Negative for visual disturbance.  Respiratory:  Negative for shortness of breath and wheezing.   Cardiovascular:  Negative for chest pain and leg swelling.  Musculoskeletal:  Negative for back pain and gait problem.  Skin:  Negative for rash.  Neurological:  Negative for dizziness, weakness  and numbness.  All other systems reviewed and are negative.   Per HPI unless specifically indicated above   Allergies as of 04/04/2022   No Known Allergies      Medication List        Accurate as of April 04, 2022  4:37 PM. If you have any questions, ask your nurse or doctor.          amLODipine 5 MG tablet Commonly known as: NORVASC TAKE 1 TABLET BY MOUTH ONCE DAILY   aspirin EC 81 MG tablet Take 81 mg by mouth daily.   busPIRone 15 MG tablet Commonly known as: BUSPAR TAKE 1 TABLET (15 MG TOTAL) BY MOUTH 2 (TWO) TIMES DAILY AS NEEDED.   CALCIUM-MAGNESIUM-ZINC-D3 PO Take 1 tablet by mouth daily at 6 (six) AM.   colchicine 0.6 MG tablet TAKE 1.2 MG (2 TABLETS) BY MOUTH ONCE, THEN TAKE 0.6 MG ONE HOUR LATER. USE AS NEEDED FOR GOUT FLARE. DO NOT REPEAT FOR AT LEAST 3 DAYS.   diclofenac 75 MG EC tablet Commonly known as: VOLTAREN Take 1 tablet (75 mg total) by mouth 2 (two) times daily.   fenofibrate 145 MG tablet Commonly known as: TRICOR Take 1 tablet (145 mg total) by mouth daily.   fluticasone 50 MCG/ACT nasal spray Commonly known as: FLONASE Place 2 sprays into both nostrils daily.   freestyle lancets SMARTSIG:Topical Daily   glucose blood test strip SMARTSIG:Via Meter Daily   icosapent Ethyl 1 g capsule Commonly known as: VASCEPA Take 2 capsules (2 g total) by mouth 2 (two) times  daily. What changed:  how much to take how to take this when to take this Changed by: Worthy Rancher, MD   losartan 50 MG tablet Commonly known as: COZAAR Take 1 tablet (50 mg total) by mouth daily. What changed: how much to take Changed by: Worthy Rancher, MD   metFORMIN 500 MG tablet Commonly known as: GLUCOPHAGE TAKE 1 TABLET BY MOUTH DAILY WITH BREAKFAST   metoprolol succinate 25 MG 24 hr tablet Commonly known as: TOPROL-XL Take 1 tablet (25 mg total) by mouth daily. What changed: how much to take Changed by: Fransisca Kaufmann Teague Goynes, MD   nitroGLYCERIN  0.4 MG SL tablet Commonly known as: NITROSTAT Place 1 tablet (0.4 mg total) under the tongue every 5 (five) minutes as needed for chest pain.   pantoprazole 40 MG tablet Commonly known as: PROTONIX TAKE 1 TABLET BY MOUTH ONCE DAILY   rosuvastatin 40 MG tablet Commonly known as: CRESTOR Take 1 tablet (40 mg total) by mouth at bedtime. What changed: how much to take Changed by: Worthy Rancher, MD   tiZANidine 2 MG tablet Commonly known as: ZANAFLEX Take 1 tablet (2 mg total) by mouth every 6 (six) hours as needed for muscle spasms.   triamcinolone cream 0.1 % Commonly known as: KENALOG Apply to affected area(s) of skin twice daily for 14 days. (apply to skin bid 14 days)   Trulicity 3 LK/4.4WN Sopn Generic drug: Dulaglutide Inject 3 mg as directed once a week.   ZYRTEC ALLERGY PO Take 1 tablet by mouth daily at 6 (six) AM.         Objective:   BP 136/83   Pulse (!) 109   Temp 98 F (36.7 C)   Ht '5\' 8"'$  (1.727 m)   Wt 225 lb (102.1 kg)   SpO2 93%   BMI 34.21 kg/m   Wt Readings from Last 3 Encounters:  04/04/22 225 lb (102.1 kg)  12/20/21 229 lb (103.9 kg)  11/19/21 230 lb 9.6 oz (104.6 kg)    Physical Exam Vitals and nursing note reviewed.  Constitutional:      General: He is not in acute distress.    Appearance: He is well-developed. He is not diaphoretic.  Eyes:     General: No scleral icterus.    Conjunctiva/sclera: Conjunctivae normal.  Neck:     Thyroid: No thyromegaly.  Cardiovascular:     Rate and Rhythm: Normal rate and regular rhythm.     Heart sounds: Normal heart sounds. No murmur heard. Pulmonary:     Effort: Pulmonary effort is normal. No respiratory distress.     Breath sounds: Normal breath sounds. No wheezing.  Musculoskeletal:        General: No swelling. Normal range of motion.     Cervical back: Neck supple.  Lymphadenopathy:     Cervical: No cervical adenopathy.  Skin:    General: Skin is warm and dry.     Findings: No  rash.  Neurological:     Mental Status: He is alert and oriented to person, place, and time.     Coordination: Coordination normal.  Psychiatric:        Behavior: Behavior normal.     Results for orders placed or performed in visit on 04/04/22  Bayer DCA Hb A1c Waived  Result Value Ref Range   HB A1C (BAYER DCA - WAIVED) 6.4 (H) 4.8 - 5.6 %    Assessment & Plan:   Problem List Items Addressed This Visit  Cardiovascular and Mediastinum   HTN (hypertension) - Primary   Relevant Medications   amLODipine (NORVASC) 5 MG tablet   fenofibrate (TRICOR) 145 MG tablet   icosapent Ethyl (VASCEPA) 1 g capsule   losartan (COZAAR) 50 MG tablet   metoprolol succinate (TOPROL-XL) 25 MG 24 hr tablet   rosuvastatin (CRESTOR) 40 MG tablet     Digestive   GERD (gastroesophageal reflux disease)   Relevant Medications   pantoprazole (PROTONIX) 40 MG tablet     Endocrine   Type 2 diabetes mellitus with other specified complication (HCC)   Relevant Medications   losartan (COZAAR) 50 MG tablet   rosuvastatin (CRESTOR) 40 MG tablet     Other   Hyperlipidemia   Relevant Medications   amLODipine (NORVASC) 5 MG tablet   fenofibrate (TRICOR) 145 MG tablet   icosapent Ethyl (VASCEPA) 1 g capsule   losartan (COZAAR) 50 MG tablet   metoprolol succinate (TOPROL-XL) 25 MG 24 hr tablet   rosuvastatin (CRESTOR) 40 MG tablet    Continue current medicine no changes, A1c looks good at 6.4.  Will await rest of blood work. Follow up plan: Return in about 3 months (around 07/05/2022), or if symptoms worsen or fail to improve, for diabetes recheck.  Counseling provided for all of the vaccine components No orders of the defined types were placed in this encounter.   Caryl Pina, MD [facility] 04/04/2022, 4:37 PM

## 2022-04-23 ENCOUNTER — Other Ambulatory Visit: Payer: Self-pay | Admitting: Family Medicine

## 2022-05-15 ENCOUNTER — Ambulatory Visit (HOSPITAL_COMMUNITY): Admit: 2022-05-15 | Payer: Self-pay | Admitting: Gastroenterology

## 2022-05-15 ENCOUNTER — Encounter (HOSPITAL_COMMUNITY): Payer: Self-pay

## 2022-05-15 SURGERY — UPPER ENDOSCOPIC ULTRASOUND (EUS) RADIAL
Anesthesia: Monitor Anesthesia Care

## 2022-05-20 ENCOUNTER — Other Ambulatory Visit (HOSPITAL_BASED_OUTPATIENT_CLINIC_OR_DEPARTMENT_OTHER): Payer: Self-pay

## 2022-06-04 ENCOUNTER — Other Ambulatory Visit (HOSPITAL_BASED_OUTPATIENT_CLINIC_OR_DEPARTMENT_OTHER): Payer: Self-pay

## 2022-07-08 ENCOUNTER — Other Ambulatory Visit (HOSPITAL_BASED_OUTPATIENT_CLINIC_OR_DEPARTMENT_OTHER): Payer: Self-pay

## 2022-07-17 ENCOUNTER — Other Ambulatory Visit (HOSPITAL_BASED_OUTPATIENT_CLINIC_OR_DEPARTMENT_OTHER): Payer: Self-pay

## 2022-07-17 ENCOUNTER — Encounter: Payer: Self-pay | Admitting: Family Medicine

## 2022-07-17 ENCOUNTER — Ambulatory Visit (INDEPENDENT_AMBULATORY_CARE_PROVIDER_SITE_OTHER): Payer: No Typology Code available for payment source | Admitting: Family Medicine

## 2022-07-17 VITALS — BP 135/88 | HR 66 | Temp 97.5°F | Ht 68.0 in | Wt 225.0 lb

## 2022-07-17 DIAGNOSIS — E782 Mixed hyperlipidemia: Secondary | ICD-10-CM

## 2022-07-17 DIAGNOSIS — I10 Essential (primary) hypertension: Secondary | ICD-10-CM | POA: Diagnosis not present

## 2022-07-17 DIAGNOSIS — E1169 Type 2 diabetes mellitus with other specified complication: Secondary | ICD-10-CM

## 2022-07-17 LAB — BAYER DCA HB A1C WAIVED: HB A1C (BAYER DCA - WAIVED): 5.9 % — ABNORMAL HIGH (ref 4.8–5.6)

## 2022-07-17 MED ORDER — TRULICITY 3 MG/0.5ML ~~LOC~~ SOAJ
3.0000 mg | SUBCUTANEOUS | 3 refills | Status: DC
  Filled 2022-07-17 – 2022-11-20 (×3): qty 6, 84d supply, fill #0

## 2022-07-17 NOTE — Progress Notes (Signed)
BP 135/88   Pulse 66   Temp (!) 97.5 F (36.4 C)   Ht '5\' 8"'  (1.727 m)   Wt 225 lb (102.1 kg)   SpO2 97%   BMI 34.21 kg/m    Subjective:   Patient ID: Jimmy Perez., male    DOB: [birth year], 56 y.o.   MRN: [id number]  HPI: Jimmy Perez. is a 56 y.o. male presenting on 07/17/2022 for Medical Management of Chronic Issues, Hyperlipidemia, Hypertension, and Diabetes   HPI Type 2 diabetes mellitus Patient comes in today for recheck of his diabetes. Patient has been currently taking metformin and Trulicity. Patient is currently on an ACE inhibitor/ARB. Patient has seen an ophthalmologist this year. Patient denies any issues with their feet. The symptom started onset as an adult hypertension and hyperlipidemia ARE RELATED TO DM   Hypertension Patient is currently on amlodipine and losartan and metoprolol, and their blood pressure today is 135/80. Patient denies any lightheadedness or dizziness. Patient denies headaches, blurred vision, chest pains, shortness of breath, or weakness. Denies any side effects from medication and is content with current medication.   Hyperlipidemia Patient is coming in for recheck of his hyperlipidemia. The patient is currently taking Crestor. They deny any issues with myalgias or history of liver damage from it. They deny any focal numbness or weakness or chest pain.   Relevant past medical, surgical, family and social history reviewed and updated as indicated. Interim medical history since our last visit reviewed. Allergies and medications reviewed and updated.  Review of Systems  Constitutional:  Negative for chills and fever.  Eyes:  Negative for visual disturbance.  Respiratory:  Negative for shortness of breath and wheezing.   Cardiovascular:  Negative for chest pain and leg swelling.  Musculoskeletal:  Negative for back pain and gait problem.  Skin:  Negative for rash.  Neurological:  Negative for dizziness, weakness and  light-headedness.  All other systems reviewed and are negative.   Per HPI unless specifically indicated above   Allergies as of 07/17/2022   No Known Allergies      Medication List        Accurate as of July 17, 2022  4:38 PM. If you have any questions, ask your nurse or doctor.          amLODipine 5 MG tablet Commonly known as: NORVASC TAKE 1 TABLET BY MOUTH ONCE DAILY   aspirin EC 81 MG tablet Take 81 mg by mouth daily.   busPIRone 15 MG tablet Commonly known as: BUSPAR TAKE 1 TABLET (15 MG TOTAL) BY MOUTH 2 (TWO) TIMES DAILY AS NEEDED.   CALCIUM-MAGNESIUM-ZINC-D3 PO Take 1 tablet by mouth daily at 6 (six) AM.   colchicine 0.6 MG tablet TAKE 1.2 MG (2 TABLETS) BY MOUTH ONCE, THEN TAKE 0.6 MG ONE HOUR LATER. USE AS NEEDED FOR GOUT FLARE. DO NOT REPEAT FOR AT LEAST 3 DAYS.   diclofenac 75 MG EC tablet Commonly known as: VOLTAREN Take 1 tablet (75 mg total) by mouth 2 (two) times daily.   fenofibrate 145 MG tablet Commonly known as: TRICOR Take 1 tablet (145 mg total) by mouth daily.   fluticasone 50 MCG/ACT nasal spray Commonly known as: FLONASE Place 2 sprays into both nostrils daily.   freestyle lancets SMARTSIG:Topical Daily   glucose blood test strip SMARTSIG:Via Meter Daily   losartan 50 MG tablet Commonly known as: COZAAR Take 1 tablet (50 mg total) by mouth daily.   metFORMIN 500 MG tablet  Commonly known as: GLUCOPHAGE TAKE 1 TABLET BY MOUTH DAILY WITH BREAKFAST   metoprolol succinate 25 MG 24 hr tablet Commonly known as: TOPROL-XL Take 1 tablet (25 mg total) by mouth daily.   nitroGLYCERIN 0.4 MG SL tablet Commonly known as: NITROSTAT Place 1 tablet (0.4 mg total) under the tongue every 5 (five) minutes as needed for chest pain.   pantoprazole 40 MG tablet Commonly known as: PROTONIX TAKE 1 TABLET BY MOUTH ONCE DAILY   rosuvastatin 40 MG tablet Commonly known as: CRESTOR Take 1 tablet (40 mg total) by mouth at bedtime.    tiZANidine 2 MG tablet Commonly known as: ZANAFLEX Take 1 tablet (2 mg total) by mouth every 6 (six) hours as needed for muscle spasms.   triamcinolone cream 0.1 % Commonly known as: KENALOG Apply to affected area(s) of skin twice daily for 14 days. (apply to skin bid 14 days)   Trulicity 3 UU/7.2ZD Sopn Generic drug: Dulaglutide Inject 3 mg as directed once a week.   Vascepa 1 g capsule Generic drug: icosapent Ethyl Take 2 capsules (2 g total) by mouth 2 (two) times daily.   ZYRTEC ALLERGY PO Take 1 tablet by mouth daily at 6 (six) AM.         Objective:   BP 135/88   Pulse 66   Temp (!) 97.5 F (36.4 C)   Ht '5\' 8"'  (1.727 m)   Wt 225 lb (102.1 kg)   SpO2 97%   BMI 34.21 kg/m   Wt Readings from Last 3 Encounters:  07/17/22 225 lb (102.1 kg)  04/04/22 225 lb (102.1 kg)  12/20/21 229 lb (103.9 kg)    Physical Exam Vitals and nursing note reviewed.  Constitutional:      General: He is not in acute distress.    Appearance: He is well-developed. He is not diaphoretic.  Eyes:     General: No scleral icterus.    Conjunctiva/sclera: Conjunctivae normal.  Neck:     Thyroid: No thyromegaly.  Cardiovascular:     Rate and Rhythm: Normal rate and regular rhythm.     Heart sounds: Normal heart sounds. No murmur heard. Pulmonary:     Effort: Pulmonary effort is normal. No respiratory distress.     Breath sounds: Normal breath sounds. No wheezing.  Musculoskeletal:        General: No swelling. Normal range of motion.     Cervical back: Neck supple.  Lymphadenopathy:     Cervical: No cervical adenopathy.  Skin:    General: Skin is warm and dry.     Findings: No rash.  Neurological:     Mental Status: He is alert and oriented to person, place, and time.     Coordination: Coordination normal.  Psychiatric:        Behavior: Behavior normal.     Results for orders placed or performed in visit on 07/17/22  Bayer DCA Hb A1c Waived  Result Value Ref Range   HB  A1C (BAYER DCA - WAIVED) 5.9 (H) 4.8 - 5.6 %    Assessment & Plan:   Problem List Items Addressed This Visit       Cardiovascular and Mediastinum   HTN (hypertension)   Relevant Orders   CBC with Differential/Platelet   CMP14+EGFR   Lipid panel     Endocrine   Type 2 diabetes mellitus with other specified complication ([place]) - Primary   Relevant Medications   Dulaglutide (TRULICITY) 3 GU/4.4IH SOPN   Other Relevant Orders  Bayer DCA Hb A1c Waived (Completed)   Microalbumin / creatinine urine ratio   CBC with Differential/Platelet   CMP14+EGFR   Lipid panel     Other   Hyperlipidemia   Relevant Orders   CBC with Differential/Platelet   CMP14+EGFR   Lipid panel  A1c looks great at 5.9, continue current medicine.  Will await the rest of his blood work.  Follow up plan: Return in about 3 months (around 10/16/2022), or if symptoms worsen or fail to improve, for Diabetes hypertension and cholesterol.  Counseling provided for all of the vaccine components Orders Placed This Encounter  Procedures   Bayer DCA Hb A1c Waived   Microalbumin / creatinine urine ratio   CBC with Differential/Platelet   CMP14+EGFR   Lipid panel    Caryl Pina, MD [facility] 07/17/2022, 4:38 PM

## 2022-07-18 LAB — CBC WITH DIFFERENTIAL/PLATELET
Basophils Absolute: 0 10*3/uL (ref 0.0–0.2)
Basos: 1 %
EOS (ABSOLUTE): 0.1 10*3/uL (ref 0.0–0.4)
Eos: 2 %
Hematocrit: 39.8 % (ref 37.5–51.0)
Hemoglobin: 13.4 g/dL (ref 13.0–17.7)
Immature Grans (Abs): 0 10*3/uL (ref 0.0–0.1)
Immature Granulocytes: 0 %
Lymphocytes Absolute: 1.6 10*3/uL (ref 0.7–3.1)
Lymphs: 36 %
MCH: 31.6 pg (ref 26.6–33.0)
MCHC: 33.7 g/dL (ref 31.5–35.7)
MCV: 94 fL (ref 79–97)
Monocytes Absolute: 0.5 10*3/uL (ref 0.1–0.9)
Monocytes: 11 %
Neutrophils Absolute: 2.2 10*3/uL (ref 1.4–7.0)
Neutrophils: 50 %
Platelets: 238 10*3/uL (ref 150–450)
RBC: 4.24 x10E6/uL (ref 4.14–5.80)
RDW: 12.9 % (ref 11.6–15.4)
WBC: 4.4 10*3/uL (ref 3.4–10.8)

## 2022-07-18 LAB — CMP14+EGFR
ALT: 20 IU/L (ref 0–44)
AST: 18 IU/L (ref 0–40)
Albumin/Globulin Ratio: 2.8 — ABNORMAL HIGH (ref 1.2–2.2)
Albumin: 4.4 g/dL (ref 3.8–4.9)
Alkaline Phosphatase: 47 IU/L (ref 44–121)
BUN/Creatinine Ratio: 16 (ref 9–20)
BUN: 15 mg/dL (ref 6–24)
Bilirubin Total: 0.4 mg/dL (ref 0.0–1.2)
CO2: 22 mmol/L (ref 20–29)
Calcium: 8.9 mg/dL (ref 8.7–10.2)
Chloride: 105 mmol/L (ref 96–106)
Creatinine, Ser: 0.91 mg/dL (ref 0.76–1.27)
Globulin, Total: 1.6 g/dL (ref 1.5–4.5)
Glucose: 117 mg/dL — ABNORMAL HIGH (ref 70–99)
Potassium: 4.4 mmol/L (ref 3.5–5.2)
Sodium: 141 mmol/L (ref 134–144)
Total Protein: 6 g/dL (ref 6.0–8.5)
eGFR: 100 mL/min/{1.73_m2} (ref >59)

## 2022-07-18 LAB — LIPID PANEL
Chol/HDL Ratio: 3 ratio (ref 0.0–5.0)
Cholesterol, Total: 103 mg/dL (ref 100–199)
HDL: 34 mg/dL — ABNORMAL LOW (ref >39)
LDL Chol Calc (NIH): 36 mg/dL (ref 0–99)
Triglycerides: 203 mg/dL — ABNORMAL HIGH (ref 0–149)
VLDL Cholesterol Cal: 33 mg/dL (ref 5–40)

## 2022-07-18 LAB — MICROALBUMIN / CREATININE URINE RATIO
Creatinine, Urine: 26.6 mg/dL
Microalb/Creat Ratio: 41 mg/g creat — ABNORMAL HIGH (ref 0–29)
Microalbumin, Urine: 11 ug/mL

## 2022-07-20 NOTE — Progress Notes (Unsigned)
HPI male nonsmoker followed for OSA, complicated by HBP, GERD, obesity, CAD/ stent Unattended home sleep study 01/05/15 AHI 77/ hr, desat to 73%, weight 253 lbs   -----------------------------------------------------------------------------------------.   07/25/21- 56 year old male never smoker followed for OSA, complicated by HBP, GERD, obesity, CAD/ stent, Hyperlipidemia, DM2, GERD, Morbid Obesity,  CPAP auto 10-20/ [facility] Download- compliance 100%, AHI 1.5/ hr  (from April)  AirSense 10 AutoSet Body weight today- Covid vax-none Flu vax-had Download reviewed.  Doing well with CPAP.  No concerns expressed.  Denies interval health problems or questions.  We can replace old machine.  07/21/22- 56 year old male never smoker followed for OSA, complicated by HBP, GERD, obesity, CAD/ stent, Hyperlipidemia, DM2, GERD, Morbid Obesity,  CPAP auto 10-20/ [facility]                    AirSense 10 AutoSet     Download- compliance  97%, AHI 0.8/ hr Body weight today- 227 lbs Covid vax-none Flu vax-   ROS-see HPI   + = positive Constitutional:   No-   weight loss, night sweats, fevers, chills, +fatigue, lassitude. HEENT:   No-  headaches, difficulty swallowing, tooth/dental problems, sore throat,       No-  sneezing, itching, ear ache, nasal congestion, post nasal drip,  CV:  No-   chest pain, orthopnea, PND, swelling in lower extremities, anasarca,                                                    dizziness, palpitations Resp: No-   shortness of breath with exertion or at rest.              No-   productive cough,  No non-productive cough,  No- coughing up of blood.              No-   change in color of mucus.  No- wheezing.   Skin: No-   rash or lesions. GI:  +heartburn, indigestion, No-abdominal pain, nausea, vomiting,  GU:  MS:  No-   joint pain or swelling.   Neuro-     nothing unusual Psych:  No- change in mood or affect. No depression or anxiety.  No memory loss.  OBJ- Physical Exam      +obese General- Alert, Oriented, Affect-appropriate, Distress- none acute, +obese Skin- rash-none, lesions- none, excoriation- none Lymphadenopathy- none Head- atraumatic            Eyes- Gross vision intact, PERRLA, conjunctivae and secretions clear            Ears- +Hearing aid            Nose- Clear, no-Septal dev, mucus, polyps, erosion, perforation             Throat- Mallampati IV , mucosa clear , drainage- none, tonsils- atrophic, + hoarse Neck- flexible , trachea midline, no stridor , thyroid nl, carotid no bruit Chest - symmetrical excursion , unlabored           Heart/CV- RRR , no murmur , no gallop  , no rub, nl s1 s2                           - JVD- none , edema- none, stasis changes- none, varices- none  Lung- clear to P&A, wheeze- none, cough- none , dullness-none, rub- none           Chest wall-  Abd-  Br/ Gen/ Rectal- Not done, not indicated Extrem- cyanosis- none, clubbing, none, atrophy- none, strength- nl Neuro- grossly intact to observation

## 2022-07-21 ENCOUNTER — Ambulatory Visit (INDEPENDENT_AMBULATORY_CARE_PROVIDER_SITE_OTHER): Payer: No Typology Code available for payment source | Admitting: Internal Medicine

## 2022-07-21 ENCOUNTER — Encounter: Payer: Self-pay | Admitting: Internal Medicine

## 2022-07-21 VITALS — BP 128/78 | HR 70 | Temp 97.8°F | Ht 68.0 in | Wt 227.8 lb

## 2022-07-21 DIAGNOSIS — G4733 Obstructive sleep apnea (adult) (pediatric): Secondary | ICD-10-CM

## 2022-07-21 NOTE — Patient Instructions (Signed)
Order- [facility]- patient would like to replace DME, changing [facility] to [place] old CPAP machine 10-20, mask of choice, humidifier, supplies, Please install AirView/ card

## 2022-07-22 ENCOUNTER — Encounter: Payer: Self-pay | Admitting: Internal Medicine

## 2022-07-22 NOTE — Assessment & Plan Note (Signed)
Encourage diet, exercise

## 2022-07-22 NOTE — Assessment & Plan Note (Signed)
Benefits from CPAP with good compliance and control Plan- replace old machine auto 10-20. He requests DME change to [place].

## 2022-08-11 ENCOUNTER — Other Ambulatory Visit (HOSPITAL_BASED_OUTPATIENT_CLINIC_OR_DEPARTMENT_OTHER): Payer: Self-pay

## 2022-08-11 ENCOUNTER — Other Ambulatory Visit: Payer: Self-pay

## 2022-08-11 DIAGNOSIS — M109 Gout, unspecified: Secondary | ICD-10-CM

## 2022-08-11 MED ORDER — COLCHICINE 0.6 MG PO TABS
ORAL_TABLET | ORAL | 2 refills | Status: DC
  Filled 2022-08-11 – 2023-01-29 (×2): qty 9, 3d supply, fill #0

## 2022-08-11 NOTE — Telephone Encounter (Signed)
Patient is having gout flare, please refill Colchicine to [place].

## 2022-08-28 ENCOUNTER — Other Ambulatory Visit: Payer: Self-pay | Admitting: Family Medicine

## 2022-08-28 ENCOUNTER — Other Ambulatory Visit (HOSPITAL_BASED_OUTPATIENT_CLINIC_OR_DEPARTMENT_OTHER): Payer: Self-pay

## 2022-08-28 DIAGNOSIS — J069 Acute upper respiratory infection, unspecified: Secondary | ICD-10-CM

## 2022-08-28 MED ORDER — FLUTICASONE PROPIONATE 50 MCG/ACT NA SUSP
2.0000 | Freq: Every day | NASAL | 3 refills | Status: DC
  Filled 2022-08-28 – 2022-09-25 (×2): qty 16, 30d supply, fill #0
  Filled 2022-11-20: qty 16, 30d supply, fill #1
  Filled 2023-01-29: qty 16, 30d supply, fill #2

## 2022-09-04 ENCOUNTER — Other Ambulatory Visit (HOSPITAL_BASED_OUTPATIENT_CLINIC_OR_DEPARTMENT_OTHER): Payer: Self-pay

## 2022-09-05 ENCOUNTER — Other Ambulatory Visit (HOSPITAL_BASED_OUTPATIENT_CLINIC_OR_DEPARTMENT_OTHER): Payer: Self-pay

## 2022-09-25 ENCOUNTER — Other Ambulatory Visit (HOSPITAL_BASED_OUTPATIENT_CLINIC_OR_DEPARTMENT_OTHER): Payer: Self-pay

## 2022-09-26 ENCOUNTER — Other Ambulatory Visit (HOSPITAL_BASED_OUTPATIENT_CLINIC_OR_DEPARTMENT_OTHER): Payer: Self-pay

## 2022-10-02 ENCOUNTER — Other Ambulatory Visit (HOSPITAL_BASED_OUTPATIENT_CLINIC_OR_DEPARTMENT_OTHER): Payer: Self-pay

## 2022-10-14 ENCOUNTER — Other Ambulatory Visit (HOSPITAL_BASED_OUTPATIENT_CLINIC_OR_DEPARTMENT_OTHER): Payer: Self-pay

## 2022-10-16 ENCOUNTER — Other Ambulatory Visit (HOSPITAL_BASED_OUTPATIENT_CLINIC_OR_DEPARTMENT_OTHER): Payer: Self-pay

## 2022-10-16 ENCOUNTER — Encounter: Payer: Self-pay | Admitting: Family Medicine

## 2022-10-16 ENCOUNTER — Ambulatory Visit (INDEPENDENT_AMBULATORY_CARE_PROVIDER_SITE_OTHER): Payer: No Typology Code available for payment source | Admitting: Family Medicine

## 2022-10-16 VITALS — BP 130/84 | HR 76 | Temp 98.4°F | Ht 68.0 in | Wt 233.0 lb

## 2022-10-16 DIAGNOSIS — E782 Mixed hyperlipidemia: Secondary | ICD-10-CM

## 2022-10-16 DIAGNOSIS — E1169 Type 2 diabetes mellitus with other specified complication: Secondary | ICD-10-CM

## 2022-10-16 DIAGNOSIS — E559 Vitamin D deficiency, unspecified: Secondary | ICD-10-CM | POA: Diagnosis not present

## 2022-10-16 DIAGNOSIS — I10 Essential (primary) hypertension: Secondary | ICD-10-CM | POA: Diagnosis not present

## 2022-10-16 DIAGNOSIS — G4733 Obstructive sleep apnea (adult) (pediatric): Secondary | ICD-10-CM

## 2022-10-16 MED ORDER — NITROGLYCERIN 0.4 MG SL SUBL
0.4000 mg | SUBLINGUAL_TABLET | SUBLINGUAL | 1 refills | Status: AC | PRN
  Filled 2022-10-16: qty 25, 8d supply, fill #0

## 2022-10-16 NOTE — Progress Notes (Addendum)
BP 130/84   Pulse 76   Temp 98.4 F (36.9 C)   Ht _0  (1.727 m)   Wt 233 lb (105.7 kg)   SpO2 98%   BMI 35.43 kg/m    Subjective:   Patient ID: Jimmy Perez., male    DOB: [birth year], 56 y.o.   MRN: [id number]  HPI: Jimmy Perez. is a 56 y.o. male presenting on 10/16/2022 for Medical Management of Chronic Issues, Hyperlipidemia, and Diabetes   HPI Type 2 diabetes mellitus Patient comes in today for recheck of his diabetes. Patient has been currently taking metformin and Trulicity. Patient is currently on an ACE inhibitor/ARB. Patient has seen an ophthalmologist this year. Patient denies any issues with their feet. The symptom started onset as an adult hypertension and hyperlipidemia ARE RELATED TO DM   Hypertension Patient is currently on metoprolol and losartan and amlodipine, and their blood pressure today is 130/84. Patient denies any lightheadedness or dizziness. Patient denies headaches, blurred vision, chest pains, shortness of breath, or weakness. Denies any side effects from medication and is content with current medication.   Hyperlipidemia Patient is coming in for recheck of his hyperlipidemia. The patient is currently taking Crestor. They deny any issues with myalgias or history of liver damage from it. They deny any focal numbness or weakness or chest pain.   Sleep apnea Patient uses his sleep apnea machine most of the night Patient uses a sleep apnea machine most days of the month Patient feels like his CPAP makes a significant difference in his sleep and energy. Patient is on CPAP settings auto 10-20/ [facility] .   Vit d deficiency Is coming in for vitamin D deficiency recheck.  Relevant past medical, surgical, family and social history reviewed and updated as indicated. Interim medical history since our last visit reviewed. Allergies and medications reviewed and updated.  Review of Systems  Constitutional:  Negative for chills and fever.  Eyes:   Negative for visual disturbance.  Respiratory:  Negative for shortness of breath and wheezing.   Cardiovascular:  Negative for chest pain and leg swelling.  Musculoskeletal:  Negative for back pain and gait problem.  Skin:  Negative for rash.  Neurological:  Negative for dizziness, weakness and light-headedness.  All other systems reviewed and are negative.   Per HPI unless specifically indicated above   Allergies as of 10/16/2022   No Known Allergies      Medication List        Accurate as of October 16, 2022 11:59 PM. If you have any questions, ask your nurse or doctor.          amLODipine 5 MG tablet Commonly known as: NORVASC TAKE 1 TABLET BY MOUTH ONCE DAILY   aspirin EC 81 MG tablet Take 81 mg by mouth daily.   busPIRone 15 MG tablet Commonly known as: BUSPAR TAKE 1 TABLET (15 MG TOTAL) BY MOUTH 2 (TWO) TIMES DAILY AS NEEDED.   CALCIUM-MAGNESIUM-ZINC-D3 PO Take 1 tablet by mouth daily at 6 (six) AM.   colchicine 0.6 MG tablet TAKE 1.2 MG (2 TABLETS) BY MOUTH ONCE, THEN TAKE 0.6 MG ONE HOUR LATER. USE AS NEEDED FOR GOUT FLARE. DO NOT REPEAT FOR AT LEAST 3 DAYS.   diclofenac 75 MG EC tablet Commonly known as: VOLTAREN Take 1 tablet (75 mg total) by mouth 2 (two) times daily.   fenofibrate 145 MG tablet Commonly known as: TRICOR Take 1 tablet (145 mg total) by mouth daily.  fluticasone 50 MCG/ACT nasal spray Commonly known as: FLONASE Place 2 sprays into both nostrils daily.   freestyle lancets SMARTSIG:Topical Daily   glucose blood test strip SMARTSIG:Via Meter Daily   losartan 50 MG tablet Commonly known as: COZAAR Take 1 tablet (50 mg total) by mouth daily.   metFORMIN 500 MG tablet Commonly known as: GLUCOPHAGE TAKE 1 TABLET BY MOUTH DAILY WITH BREAKFAST   metoprolol succinate 25 MG 24 hr tablet Commonly known as: TOPROL-XL Take 1 tablet (25 mg total) by mouth daily.   nitroGLYCERIN 0.4 MG SL tablet Commonly known as:  NITROSTAT Place 1 tablet (0.4 mg total) under the tongue every 5 (five) minutes as needed for chest pain.   pantoprazole 40 MG tablet Commonly known as: PROTONIX TAKE 1 TABLET BY MOUTH ONCE DAILY   rosuvastatin 40 MG tablet Commonly known as: CRESTOR Take 1 tablet (40 mg total) by mouth at bedtime.   tiZANidine 2 MG tablet Commonly known as: ZANAFLEX Take 1 tablet (2 mg total) by mouth every 6 (six) hours as needed for muscle spasms.   triamcinolone cream 0.1 % Commonly known as: KENALOG Apply to affected area(s) of skin twice daily for 14 days. (apply to skin bid 14 days)   Trulicity 3 OF/7.5ZW Sopn Generic drug: Dulaglutide Inject 3 mg as directed once a week.   Vascepa 1 g capsule Generic drug: icosapent Ethyl Take 2 capsules (2 g total) by mouth 2 (two) times daily.   ZYRTEC ALLERGY PO Take 1 tablet by mouth daily at 6 (six) AM.               Durable Medical Equipment  (From admission, onward)           Start     Ordered   11/17/22 0000  For home use only DME continuous positive airway pressure (CPAP)       Comments: auto 10-20/ [facility] New CPAP machine and supplies with it Diagnosis sleep apnea  Question Answer Comment  Length of Need Lifetime   Patient has OSA or probable OSA Yes   Is the patient currently using CPAP in the home Yes   If yes (to question two) Determine DME provider and inform them of any new orders/settings   Date of face to face encounter 10/16/22   Settings Other see comments   CPAP supplies needed Mask, headgear, cushions, filters, heated tubing and water chamber      11/17/22 0841             Objective:   BP 130/84   Pulse 76   Temp 98.4 F (36.9 C)   Ht _0  (1.727 m)   Wt 233 lb (105.7 kg)   SpO2 98%   BMI 35.43 kg/m   Wt Readings from Last 3 Encounters:  10/16/22 233 lb (105.7 kg)  07/21/22 227 lb 12.8 oz (103.3 kg)  07/17/22 225 lb (102.1 kg)    Physical Exam Vitals and nursing note reviewed.   Constitutional:      General: He is not in acute distress.    Appearance: He is well-developed. He is not diaphoretic.  Eyes:     General: No scleral icterus.    Conjunctiva/sclera: Conjunctivae normal.  Neck:     Thyroid: No thyromegaly.  Cardiovascular:     Rate and Rhythm: Normal rate and regular rhythm.     Heart sounds: Normal heart sounds. No murmur heard. Pulmonary:     Effort: Pulmonary effort is normal. No respiratory distress.  Breath sounds: Normal breath sounds. No wheezing.  Musculoskeletal:        General: Normal range of motion.     Cervical back: Neck supple.  Lymphadenopathy:     Cervical: No cervical adenopathy.  Skin:    General: Skin is warm and dry.     Findings: No rash.  Neurological:     Mental Status: He is alert and oriented to person, place, and time.     Coordination: Coordination normal.  Psychiatric:        Behavior: Behavior normal.      Assessment & Plan:   Problem List Items Addressed This Visit       Cardiovascular and Mediastinum   HTN (hypertension)   Relevant Medications   nitroGLYCERIN (NITROSTAT) 0.4 MG SL tablet   Other Relevant Orders   CBC with Differential/Platelet (Completed)   CMP14+EGFR (Completed)   Lipid panel (Completed)   Bayer DCA Hb A1c Waived (Completed)     Respiratory   Obstructive sleep apnea   Relevant Orders   For home use only DME continuous positive airway pressure (CPAP)     Endocrine   Type 2 diabetes mellitus with other specified complication (HCC) - Primary   Relevant Orders   CBC with Differential/Platelet (Completed)   CMP14+EGFR (Completed)   Lipid panel (Completed)   Bayer DCA Hb A1c Waived (Completed)     Other   Hyperlipidemia   Relevant Medications   nitroGLYCERIN (NITROSTAT) 0.4 MG SL tablet   Other Relevant Orders   CBC with Differential/Platelet (Completed)   CMP14+EGFR (Completed)   Lipid panel (Completed)   Bayer DCA Hb A1c Waived (Completed)   Vitamin D deficiency    Relevant Orders   VITAMIN D 25 Hydroxy (Vit-D Deficiency, Fractures) (Completed)    Will check blood work today to see how he is doing.  No change in medicine today but will see what the blood work shows. Follow up plan: Return in about 3 months (around 01/15/2023), or if symptoms worsen or fail to improve, for dm and htn and hld.  Counseling provided for all of the vaccine components Orders Placed This Encounter  Procedures  . For home use only DME continuous positive airway pressure (CPAP)  . CBC with Differential/Platelet  . CMP14+EGFR  . Lipid panel  . Bayer DCA Hb A1c Waived  . VITAMIN D 25 Hydroxy (Vit-D Deficiency, Fractures)    Caryl Pina, MD [facility] 11/17/2022, 8:41 AM

## 2022-10-17 ENCOUNTER — Other Ambulatory Visit (HOSPITAL_BASED_OUTPATIENT_CLINIC_OR_DEPARTMENT_OTHER): Payer: Self-pay

## 2022-10-17 LAB — CBC WITH DIFFERENTIAL/PLATELET
Basophils Absolute: 0 10*3/uL (ref 0.0–0.2)
Basos: 1 %
EOS (ABSOLUTE): 0.1 10*3/uL (ref 0.0–0.4)
Eos: 2 %
Hematocrit: 43.8 % (ref 37.5–51.0)
Hemoglobin: 15 g/dL (ref 13.0–17.7)
Immature Grans (Abs): 0 10*3/uL (ref 0.0–0.1)
Immature Granulocytes: 1 %
Lymphocytes Absolute: 2.1 10*3/uL (ref 0.7–3.1)
Lymphs: 29 %
MCH: 31.3 pg (ref 26.6–33.0)
MCHC: 34.2 g/dL (ref 31.5–35.7)
MCV: 91 fL (ref 79–97)
Monocytes Absolute: 0.6 10*3/uL (ref 0.1–0.9)
Monocytes: 8 %
Neutrophils Absolute: 4.4 10*3/uL (ref 1.4–7.0)
Neutrophils: 59 %
Platelets: 277 10*3/uL (ref 150–450)
RBC: 4.8 x10E6/uL (ref 4.14–5.80)
RDW: 12.4 % (ref 11.6–15.4)
WBC: 7.3 10*3/uL (ref 3.4–10.8)

## 2022-10-17 LAB — LIPID PANEL
Chol/HDL Ratio: 2.5 ratio (ref 0.0–5.0)
Cholesterol, Total: 119 mg/dL (ref 100–199)
HDL: 47 mg/dL (ref >39)
LDL Chol Calc (NIH): 47 mg/dL (ref 0–99)
Triglycerides: 148 mg/dL (ref 0–149)
VLDL Cholesterol Cal: 25 mg/dL (ref 5–40)

## 2022-10-17 LAB — CMP14+EGFR
ALT: 28 IU/L (ref 0–44)
AST: 17 IU/L (ref 0–40)
Albumin/Globulin Ratio: 2.4 — ABNORMAL HIGH (ref 1.2–2.2)
Albumin: 5.1 g/dL — ABNORMAL HIGH (ref 3.8–4.9)
Alkaline Phosphatase: 58 IU/L (ref 44–121)
BUN/Creatinine Ratio: 14 (ref 9–20)
BUN: 14 mg/dL (ref 6–24)
Bilirubin Total: 0.6 mg/dL (ref 0.0–1.2)
CO2: 23 mmol/L (ref 20–29)
Calcium: 10.2 mg/dL (ref 8.7–10.2)
Chloride: 102 mmol/L (ref 96–106)
Creatinine, Ser: 0.97 mg/dL (ref 0.76–1.27)
Globulin, Total: 2.1 g/dL (ref 1.5–4.5)
Glucose: 118 mg/dL — ABNORMAL HIGH (ref 70–99)
Potassium: 4.1 mmol/L (ref 3.5–5.2)
Sodium: 138 mmol/L (ref 134–144)
Total Protein: 7.2 g/dL (ref 6.0–8.5)
eGFR: 92 mL/min/{1.73_m2} (ref >59)

## 2022-10-17 LAB — BAYER DCA HB A1C WAIVED: HB A1C (BAYER DCA - WAIVED): 7.7 % — ABNORMAL HIGH (ref 4.8–5.6)

## 2022-10-17 LAB — VITAMIN D 25 HYDROXY (VIT D DEFICIENCY, FRACTURES): Vit D, 25-Hydroxy: 46.5 ng/mL (ref 30.0–100.0)

## 2022-11-06 ENCOUNTER — Encounter: Payer: Self-pay | Admitting: Internal Medicine

## 2022-11-17 NOTE — Addendum Note (Signed)
Addended by: Caryl Pina on: 11/17/2022 08:41 AM   Modules accepted: Orders

## 2022-11-20 ENCOUNTER — Other Ambulatory Visit (HOSPITAL_BASED_OUTPATIENT_CLINIC_OR_DEPARTMENT_OTHER): Payer: Self-pay

## 2022-11-27 ENCOUNTER — Other Ambulatory Visit (HOSPITAL_BASED_OUTPATIENT_CLINIC_OR_DEPARTMENT_OTHER): Payer: Self-pay

## 2023-01-12 DIAGNOSIS — G4733 Obstructive sleep apnea (adult) (pediatric): Secondary | ICD-10-CM | POA: Diagnosis not present

## 2023-01-15 ENCOUNTER — Other Ambulatory Visit: Payer: Self-pay

## 2023-01-15 ENCOUNTER — Other Ambulatory Visit: Payer: 59

## 2023-01-15 DIAGNOSIS — E1169 Type 2 diabetes mellitus with other specified complication: Secondary | ICD-10-CM

## 2023-01-15 DIAGNOSIS — E782 Mixed hyperlipidemia: Secondary | ICD-10-CM | POA: Diagnosis not present

## 2023-01-15 DIAGNOSIS — I10 Essential (primary) hypertension: Secondary | ICD-10-CM

## 2023-01-15 LAB — BAYER DCA HB A1C WAIVED: HB A1C (BAYER DCA - WAIVED): 7.2 % — ABNORMAL HIGH (ref 4.8–5.6)

## 2023-01-16 ENCOUNTER — Encounter: Payer: Self-pay | Admitting: Family Medicine

## 2023-01-16 ENCOUNTER — Other Ambulatory Visit (HOSPITAL_BASED_OUTPATIENT_CLINIC_OR_DEPARTMENT_OTHER): Payer: Self-pay

## 2023-01-16 ENCOUNTER — Ambulatory Visit (INDEPENDENT_AMBULATORY_CARE_PROVIDER_SITE_OTHER): Payer: 59 | Admitting: Family Medicine

## 2023-01-16 DIAGNOSIS — K219 Gastro-esophageal reflux disease without esophagitis: Secondary | ICD-10-CM

## 2023-01-16 DIAGNOSIS — E1169 Type 2 diabetes mellitus with other specified complication: Secondary | ICD-10-CM

## 2023-01-16 DIAGNOSIS — I10 Essential (primary) hypertension: Secondary | ICD-10-CM

## 2023-01-16 DIAGNOSIS — E782 Mixed hyperlipidemia: Secondary | ICD-10-CM

## 2023-01-16 LAB — CMP14+EGFR
ALT: 33 IU/L (ref 0–44)
AST: 24 IU/L (ref 0–40)
Albumin/Globulin Ratio: 2.1 (ref 1.2–2.2)
Albumin: 4.5 g/dL (ref 3.8–4.9)
Alkaline Phosphatase: 50 IU/L (ref 44–121)
BUN/Creatinine Ratio: 11 (ref 9–20)
BUN: 12 mg/dL (ref 6–24)
Bilirubin Total: 0.4 mg/dL (ref 0.0–1.2)
CO2: 24 mmol/L (ref 20–29)
Calcium: 9.8 mg/dL (ref 8.7–10.2)
Chloride: 100 mmol/L (ref 96–106)
Creatinine, Ser: 1.08 mg/dL (ref 0.76–1.27)
Globulin, Total: 2.1 g/dL (ref 1.5–4.5)
Glucose: 136 mg/dL — ABNORMAL HIGH (ref 70–99)
Potassium: 4.8 mmol/L (ref 3.5–5.2)
Sodium: 140 mmol/L (ref 134–144)
Total Protein: 6.6 g/dL (ref 6.0–8.5)
eGFR: 81 mL/min/{1.73_m2} (ref >59)

## 2023-01-16 LAB — CBC WITH DIFFERENTIAL/PLATELET
Basophils Absolute: 0.1 10*3/uL (ref 0.0–0.2)
Basos: 1 %
EOS (ABSOLUTE): 0.2 10*3/uL (ref 0.0–0.4)
Eos: 3 %
Hematocrit: 43.4 % (ref 37.5–51.0)
Hemoglobin: 14.5 g/dL (ref 13.0–17.7)
Immature Grans (Abs): 0 10*3/uL (ref 0.0–0.1)
Immature Granulocytes: 0 %
Lymphocytes Absolute: 2.2 10*3/uL (ref 0.7–3.1)
Lymphs: 37 %
MCH: 31.7 pg (ref 26.6–33.0)
MCHC: 33.4 g/dL (ref 31.5–35.7)
MCV: 95 fL (ref 79–97)
Monocytes Absolute: 0.7 10*3/uL (ref 0.1–0.9)
Monocytes: 12 %
Neutrophils Absolute: 2.7 10*3/uL (ref 1.4–7.0)
Neutrophils: 47 %
Platelets: 259 10*3/uL (ref 150–450)
RBC: 4.58 x10E6/uL (ref 4.14–5.80)
RDW: 12.6 % (ref 11.6–15.4)
WBC: 5.8 10*3/uL (ref 3.4–10.8)

## 2023-01-16 LAB — LIPID PANEL
Chol/HDL Ratio: 3 ratio (ref 0.0–5.0)
Cholesterol, Total: 103 mg/dL (ref 100–199)
HDL: 34 mg/dL — ABNORMAL LOW (ref >39)
LDL Chol Calc (NIH): 32 mg/dL (ref 0–99)
Triglycerides: 238 mg/dL — ABNORMAL HIGH (ref 0–149)
VLDL Cholesterol Cal: 37 mg/dL (ref 5–40)

## 2023-01-16 LAB — PSA, TOTAL AND FREE
PSA, Free Pct: 56.7 %
PSA, Free: 0.17 ng/mL
Prostate Specific Ag, Serum: 0.3 ng/mL (ref 0.0–4.0)

## 2023-01-16 MED ORDER — METOPROLOL SUCCINATE ER 25 MG PO TB24
25.0000 mg | ORAL_TABLET | Freq: Every day | ORAL | 3 refills | Status: DC
  Filled 2023-01-16 – 2023-02-26 (×2): qty 90, 90d supply, fill #0
  Filled 2023-06-09: qty 90, 90d supply, fill #1
  Filled 2023-09-30: qty 90, 90d supply, fill #2

## 2023-01-16 MED ORDER — SYNJARDY 12.5-500 MG PO TABS
1.0000 | ORAL_TABLET | Freq: Every day | ORAL | 3 refills | Status: DC
  Filled 2023-01-16: qty 90, 90d supply, fill #0
  Filled 2023-06-09: qty 90, 90d supply, fill #1

## 2023-01-16 MED ORDER — FREESTYLE LITE W/DEVICE KIT
PACK | 0 refills | Status: DC
  Filled 2023-01-16: qty 1, 1d supply, fill #0

## 2023-01-16 MED ORDER — TRULICITY 1.5 MG/0.5ML ~~LOC~~ SOAJ
1.5000 mg | SUBCUTANEOUS | 3 refills | Status: DC
  Filled 2023-01-16 – 2023-01-29 (×3): qty 2, 28d supply, fill #0

## 2023-01-16 MED ORDER — FENOFIBRATE 145 MG PO TABS
145.0000 mg | ORAL_TABLET | Freq: Every day | ORAL | 3 refills | Status: DC
  Filled 2023-01-16: qty 90, 90d supply, fill #0
  Filled 2023-04-17: qty 90, 90d supply, fill #1
  Filled 2023-07-21: qty 90, 90d supply, fill #2

## 2023-01-16 MED ORDER — ICOSAPENT ETHYL 1 G PO CAPS
2.0000 g | ORAL_CAPSULE | Freq: Two times a day (BID) | ORAL | 3 refills | Status: DC
  Filled 2023-01-16: qty 360, 90d supply, fill #0
  Filled 2023-06-09: qty 360, 90d supply, fill #1
  Filled 2023-09-30: qty 360, 90d supply, fill #2

## 2023-01-16 MED ORDER — AMLODIPINE-OLMESARTAN 5-40 MG PO TABS
1.0000 | ORAL_TABLET | Freq: Every day | ORAL | 3 refills | Status: DC
  Filled 2023-01-16: qty 90, 90d supply, fill #0
  Filled 2023-06-09: qty 90, 90d supply, fill #1

## 2023-01-16 MED ORDER — PANTOPRAZOLE SODIUM 40 MG PO TBEC
40.0000 mg | DELAYED_RELEASE_TABLET | Freq: Every day | ORAL | 3 refills | Status: DC
  Filled 2023-01-16: qty 90, fill #0
  Filled 2023-01-30 – 2023-02-26 (×2): qty 90, 90d supply, fill #0
  Filled 2023-06-09: qty 90, 90d supply, fill #1
  Filled 2023-09-30: qty 90, 90d supply, fill #2

## 2023-01-16 MED ORDER — FREESTYLE LANCETS MISC
0 refills | Status: DC
  Filled 2023-01-16: qty 100, 25d supply, fill #0

## 2023-01-16 MED ORDER — GLUCOSE BLOOD VI STRP
ORAL_STRIP | 0 refills | Status: DC
  Filled 2023-01-16: qty 100, 25d supply, fill #0

## 2023-01-16 MED ORDER — ROSUVASTATIN CALCIUM 40 MG PO TABS
40.0000 mg | ORAL_TABLET | Freq: Every day | ORAL | 3 refills | Status: DC
  Filled 2023-01-16: qty 90, 90d supply, fill #0
  Filled 2023-04-17: qty 90, 90d supply, fill #1
  Filled 2023-07-21: qty 90, 90d supply, fill #2

## 2023-01-16 NOTE — Progress Notes (Signed)
BP (!) 140/81   Pulse 83   Ht 5\' 8"  (1.727 m)   Wt 235 lb (106.6 kg)   SpO2 98%   BMI 35.73 kg/m    Subjective:   Patient ID: Jimmy Loretha Stapler., male    DOB: August 21, 1966, 57 y.o.   MRN: [id number]  HPI: Jimmy Rondo Koel. is a 57 y.o. male presenting on 01/16/2023 for Medical Management of Chronic Issues, Diabetes, and Hypertension   HPI Type 2 diabetes mellitus Patient comes in today for recheck of his diabetes. Patient has been currently taking Trulicity and metformin, tried to go up on Trulicity but had stomach issues. Patient is currently on an ACE inhibitor/ARB. Patient has not seen an ophthalmologist this year. Patient denies any issues with their feet. The symptom started onset as an adult hypertension and hyperlipidemia ARE RELATED TO DM   Hypertension Patient is currently on amlodipine-olmesartan and metoprolol, and their blood pressure today is 140/81. Patient denies any lightheadedness or dizziness. Patient denies headaches, blurred vision, chest pains, shortness of breath, or weakness. Denies any side effects from medication and is content with current medication.   Hyperlipidemia Patient is coming in for recheck of his hyperlipidemia. The patient is currently taking Crestor and Vascepa and fenofibrate. They deny any issues with myalgias or history of liver damage from it. They deny any focal numbness or weakness or chest pain.   Relevant past medical, surgical, family and social history reviewed and updated as indicated. Interim medical history since our last visit reviewed. Allergies and medications reviewed and updated.  Review of Systems  Constitutional:  Negative for chills and fever.  Eyes:  Negative for visual disturbance.  Respiratory:  Negative for shortness of breath and wheezing.   Cardiovascular:  Negative for chest pain and leg swelling.  Musculoskeletal:  Negative for back pain and gait problem.  Skin:  Negative for rash.  Neurological:  Negative  for dizziness and light-headedness.  All other systems reviewed and are negative.   Per HPI unless specifically indicated above   Allergies as of 01/16/2023   No Known Allergies      Medication List        Accurate as of January 16, 2023  4:21 PM. If you have any questions, ask your nurse or doctor.          amLODipine 5 MG tablet Commonly known as: NORVASC TAKE 1 TABLET BY MOUTH ONCE DAILY   aspirin EC 81 MG tablet Take 81 mg by mouth daily.   busPIRone 15 MG tablet Commonly known as: BUSPAR TAKE 1 TABLET (15 MG TOTAL) BY MOUTH 2 (TWO) TIMES DAILY AS NEEDED.   CALCIUM-MAGNESIUM-ZINC-D3 PO Take 1 tablet by mouth daily at 6 (six) AM.   colchicine 0.6 MG tablet TAKE 1.2 MG (2 TABLETS) BY MOUTH ONCE, THEN TAKE 0.6 MG ONE HOUR LATER. USE AS NEEDED FOR GOUT FLARE. DO NOT REPEAT FOR AT LEAST 3 DAYS.   diclofenac 75 MG EC tablet Commonly known as: VOLTAREN Take 1 tablet (75 mg total) by mouth 2 (two) times daily.   fenofibrate 145 MG tablet Commonly known as: TRICOR Take 1 tablet (145 mg total) by mouth daily.   fluticasone 50 MCG/ACT nasal spray Commonly known as: FLONASE Place 2 sprays into both nostrils daily.   freestyle lancets SMARTSIG:Topical Daily   glucose blood test strip SMARTSIG:Via Meter Daily   losartan 50 MG tablet Commonly known as: COZAAR Take 1 tablet (50 mg total) by mouth daily.  metFORMIN 500 MG tablet Commonly known as: GLUCOPHAGE TAKE 1 TABLET BY MOUTH DAILY WITH BREAKFAST   metoprolol succinate 25 MG 24 hr tablet Commonly known as: TOPROL-XL Take 1 tablet (25 mg total) by mouth daily.   nitroGLYCERIN 0.4 MG SL tablet Commonly known as: NITROSTAT Place 1 tablet (0.4 mg total) under the tongue every 5 (five) minutes as needed for chest pain.   pantoprazole 40 MG tablet Commonly known as: PROTONIX TAKE 1 TABLET BY MOUTH ONCE DAILY   rosuvastatin 40 MG tablet Commonly known as: CRESTOR Take 1 tablet (40 mg total) by mouth at  bedtime.   tiZANidine 2 MG tablet Commonly known as: ZANAFLEX Take 1 tablet (2 mg total) by mouth every 6 (six) hours as needed for muscle spasms.   triamcinolone cream 0.1 % Commonly known as: KENALOG Apply to affected area(s) of skin twice daily for 14 days. (apply to skin bid 14 days)   Trulicity 3 0000000 Sopn Generic drug: Dulaglutide Inject 3 mg as directed once a week.   Vascepa 1 g capsule Generic drug: icosapent Ethyl Take 2 capsules (2 g total) by mouth 2 (two) times daily.   ZYRTEC ALLERGY PO Take 1 tablet by mouth daily at 6 (six) AM.         Objective:   Ht 5\' 8"  (1.727 m)   BMI 35.43 kg/m   Wt Readings from Last 3 Encounters:  10/16/22 233 lb (105.7 kg)  07/21/22 227 lb 12.8 oz (103.3 kg)  07/17/22 225 lb (102.1 kg)    Physical Exam Vitals and nursing note reviewed.  Constitutional:      General: He is not in acute distress.    Appearance: He is well-developed. He is not diaphoretic.  Eyes:     General: No scleral icterus.    Conjunctiva/sclera: Conjunctivae normal.  Neck:     Thyroid: No thyromegaly.  Cardiovascular:     Rate and Rhythm: Normal rate and regular rhythm.     Heart sounds: Normal heart sounds. No murmur heard. Pulmonary:     Effort: Pulmonary effort is normal. No respiratory distress.     Breath sounds: Normal breath sounds. No wheezing.  Musculoskeletal:        General: Normal range of motion.     Cervical back: Neck supple.  Lymphadenopathy:     Cervical: No cervical adenopathy.  Skin:    General: Skin is warm and dry.     Findings: No rash.  Neurological:     Mental Status: He is alert and oriented to person, place, and time.     Coordination: Coordination normal.  Psychiatric:        Behavior: Behavior normal.       Assessment & Plan:   Problem List Items Addressed This Visit       Cardiovascular and Mediastinum   HTN (hypertension)   Relevant Medications   fenofibrate (TRICOR) 145 MG tablet   icosapent  Ethyl (VASCEPA) 1 g capsule   metoprolol succinate (TOPROL-XL) 25 MG 24 hr tablet   rosuvastatin (CRESTOR) 40 MG tablet   amLODipine-olmesartan (AZOR) 5-40 MG tablet     Digestive   GERD (gastroesophageal reflux disease)   Relevant Medications   pantoprazole (PROTONIX) 40 MG tablet     Endocrine   Type 2 diabetes mellitus with other specified complication   Relevant Medications   rosuvastatin (CRESTOR) 40 MG tablet   glucose blood test strip   Dulaglutide (TRULICITY) 1.5 0000000 SOPN   amLODipine-olmesartan (AZOR) 5-40 MG  tablet   Empagliflozin-metFORMIN HCl (SYNJARDY) 12.5-500 MG TABS     Other   Hyperlipidemia   Relevant Medications   fenofibrate (TRICOR) 145 MG tablet   icosapent Ethyl (VASCEPA) 1 g capsule   metoprolol succinate (TOPROL-XL) 25 MG 24 hr tablet   rosuvastatin (CRESTOR) 40 MG tablet   amLODipine-olmesartan (AZOR) 5-40 MG tablet    We will combine a few of his medicines like his blood pressure pills.  We will start Jardiance in the form of Synjardy.  He is going to reduce his Trulicity back to the highest tolerated dose at 1.5. Follow up plan: Return in about 3 months (around 04/18/2023), or if symptoms worsen or fail to improve, for DM recheck.  Counseling provided for all of the vaccine components No orders of the defined types were placed in this encounter.   Caryl Pina, MD [facility] 01/16/2023, 4:21 PM

## 2023-01-18 ENCOUNTER — Other Ambulatory Visit (HOSPITAL_BASED_OUTPATIENT_CLINIC_OR_DEPARTMENT_OTHER): Payer: Self-pay

## 2023-01-19 ENCOUNTER — Other Ambulatory Visit (HOSPITAL_BASED_OUTPATIENT_CLINIC_OR_DEPARTMENT_OTHER): Payer: Self-pay

## 2023-01-20 ENCOUNTER — Other Ambulatory Visit (HOSPITAL_BASED_OUTPATIENT_CLINIC_OR_DEPARTMENT_OTHER): Payer: Self-pay

## 2023-01-29 ENCOUNTER — Other Ambulatory Visit (HOSPITAL_BASED_OUTPATIENT_CLINIC_OR_DEPARTMENT_OTHER): Payer: Self-pay

## 2023-01-30 ENCOUNTER — Other Ambulatory Visit (HOSPITAL_BASED_OUTPATIENT_CLINIC_OR_DEPARTMENT_OTHER): Payer: Self-pay

## 2023-02-06 DIAGNOSIS — E119 Type 2 diabetes mellitus without complications: Secondary | ICD-10-CM | POA: Diagnosis not present

## 2023-02-06 LAB — HM DIABETES EYE EXAM

## 2023-02-12 DIAGNOSIS — G4733 Obstructive sleep apnea (adult) (pediatric): Secondary | ICD-10-CM | POA: Diagnosis not present

## 2023-02-16 ENCOUNTER — Telehealth: Payer: Self-pay

## 2023-02-16 NOTE — Telephone Encounter (Signed)
Pt would like to d/c Trulicity.   Pt missed dose and was given 5 days later than normal. Pt started having black stools. Pt does have stomach pain.  Needs samples of Jardiance.

## 2023-02-16 NOTE — Telephone Encounter (Signed)
Okay that is fine to go ahead and stop the Trulicity, yes please check to see if we have any samples for [place] or [place], he is actually on [place] now.  If he takes [place] it would be 25 mg daily, if it is a [place] he is on the 12-1/2 twice daily

## 2023-02-16 NOTE — Telephone Encounter (Signed)
Wife made aware.   Jardiance samples given.

## 2023-02-23 ENCOUNTER — Other Ambulatory Visit (HOSPITAL_BASED_OUTPATIENT_CLINIC_OR_DEPARTMENT_OTHER): Payer: Self-pay

## 2023-02-25 ENCOUNTER — Other Ambulatory Visit: Payer: Self-pay | Admitting: Family Medicine

## 2023-02-25 DIAGNOSIS — M109 Gout, unspecified: Secondary | ICD-10-CM

## 2023-02-25 MED ORDER — COLCHICINE 0.6 MG PO TABS: ORAL_TABLET | ORAL | 0 refills | Status: AC

## 2023-02-26 ENCOUNTER — Other Ambulatory Visit: Payer: Self-pay

## 2023-02-26 ENCOUNTER — Other Ambulatory Visit (HOSPITAL_BASED_OUTPATIENT_CLINIC_OR_DEPARTMENT_OTHER): Payer: Self-pay

## 2023-03-09 ENCOUNTER — Ambulatory Visit: Payer: 59 | Admitting: Internal Medicine

## 2023-03-10 DIAGNOSIS — G4733 Obstructive sleep apnea (adult) (pediatric): Secondary | ICD-10-CM | POA: Diagnosis not present

## 2023-03-14 DIAGNOSIS — G4733 Obstructive sleep apnea (adult) (pediatric): Secondary | ICD-10-CM | POA: Diagnosis not present

## 2023-03-23 NOTE — Progress Notes (Signed)
HPI male nonsmoker followed for OSA, complicated by HBP, GERD, obesity, CAD/ stent Unattended home sleep study 01/05/15 AHI 77/ hr, desat to 73%, weight 253 lbs   -----------------------------------------------------------------------------------------.   07/21/22- 57 year old male never smoker followed for OSA, complicated by HBP, GERD, obesity, CAD/ stent, Hyperlipidemia, DM2, GERD, Morbid Obesity,  CPAP auto 10-20/ [employer]                    AirSense 10 AutoSet     Download- compliance  97%, AHI 0.8/ hr Body weight today- 227 lbs Covid vax-none Flu vax- declined Download reviewed. He  is due to replace old machine, and would like to change DME to [facility] where his wife has a connection.  03/24/23- 57 year old male never smoker followed for OSA, complicated by HBP, GERD, obesity, CAD/ stent, Hyperlipidemia, DM2, GERD, Morbid Obesity,  CPAP auto 10-20/ [facility]                  AirSense 10 AutoSet     Download- compliance  83%, AHI 0.6/ hr Body weight today-234 lbs -----Pt is doing well. No issues with cpap machine  Adjusting to replacement CPAP machine.  Download reviewed.  Comfort issues discussed.  Changed DME to [facility]. He denies new problems.  ROS-see HPI   + = positive Constitutional:   No-   weight loss, night sweats, fevers, chills, +fatigue, lassitude. HEENT:   No-  headaches, difficulty swallowing, tooth/dental problems, sore throat,       No-  sneezing, itching, ear ache, nasal congestion, post nasal drip,  CV:  No-   chest pain, orthopnea, PND, swelling in lower extremities, anasarca,                                                    dizziness, palpitations Resp: No-   shortness of breath with exertion or at rest.              No-   productive cough,  No non-productive cough,  No- coughing up of blood.              No-   change in color of mucus.  No- wheezing.   Skin: No-   rash or lesions. GI:  +heartburn, indigestion, No-abdominal pain, nausea, vomiting,  GU:  MS:   No-   joint pain or swelling.   Neuro-     nothing unusual Psych:  No- change in mood or affect. No depression or anxiety.  No memory loss.  OBJ- Physical Exam     +obese General- Alert, Oriented, Affect-appropriate, Distress- none acute,  Skin- rash-none, lesions- none, excoriation- none Lymphadenopathy- none Head- atraumatic            Eyes- Gross vision intact, PERRLA, conjunctivae and secretions clear            Ears- +Hearing aid            Nose- Clear, no-Septal dev, mucus, polyps, erosion, perforation             Throat- Mallampati IV , mucosa clear , drainage- none, tonsils- atrophic, + hoarse Neck- flexible , trachea midline, no stridor , thyroid nl, carotid no bruit Chest - symmetrical excursion , unlabored           Heart/CV- RRR , no murmur , no gallop  , no rub,  nl s1 s2                           - JVD- none , edema- none, stasis changes- none, varices- none           Lung- clear to P&A, wheeze- none, cough- none , dullness-none, rub- none           Chest wall-  Abd-  Br/ Gen/ Rectal- Not done, not indicated Extrem- cyanosis- none, clubbing, none, atrophy- none, strength- nl Neuro- grossly intact to observation

## 2023-03-24 ENCOUNTER — Encounter: Payer: Self-pay | Admitting: Internal Medicine

## 2023-03-24 ENCOUNTER — Other Ambulatory Visit (HOSPITAL_BASED_OUTPATIENT_CLINIC_OR_DEPARTMENT_OTHER): Payer: Self-pay

## 2023-03-24 ENCOUNTER — Other Ambulatory Visit: Payer: Self-pay | Admitting: Family Medicine

## 2023-03-24 ENCOUNTER — Ambulatory Visit: Payer: 59 | Admitting: Internal Medicine

## 2023-03-24 VITALS — BP 126/74 | HR 82 | Ht 67.0 in | Wt 234.4 lb

## 2023-03-24 DIAGNOSIS — G4733 Obstructive sleep apnea (adult) (pediatric): Secondary | ICD-10-CM | POA: Diagnosis not present

## 2023-03-24 DIAGNOSIS — I10 Essential (primary) hypertension: Secondary | ICD-10-CM | POA: Diagnosis not present

## 2023-03-24 DIAGNOSIS — J069 Acute upper respiratory infection, unspecified: Secondary | ICD-10-CM

## 2023-03-24 MED ORDER — FLUTICASONE PROPIONATE 50 MCG/ACT NA SUSP
2.0000 | Freq: Every day | NASAL | 10 refills | Status: DC
Start: 2023-03-24 — End: 2024-08-03
  Filled 2023-03-24: qty 16, 30d supply, fill #0
  Filled 2023-09-30: qty 16, 30d supply, fill #1

## 2023-03-24 NOTE — Patient Instructions (Signed)
We can continue CPAP auto 10-20  Ok to talk with [facility] about trying different mask styles to see what suits you.  Please call if we can help

## 2023-04-02 DIAGNOSIS — G4733 Obstructive sleep apnea (adult) (pediatric): Secondary | ICD-10-CM | POA: Diagnosis not present

## 2023-04-14 DIAGNOSIS — G4733 Obstructive sleep apnea (adult) (pediatric): Secondary | ICD-10-CM | POA: Diagnosis not present

## 2023-04-17 ENCOUNTER — Other Ambulatory Visit (HOSPITAL_BASED_OUTPATIENT_CLINIC_OR_DEPARTMENT_OTHER): Payer: Self-pay

## 2023-04-24 ENCOUNTER — Ambulatory Visit: Payer: 59 | Admitting: Family Medicine

## 2023-05-01 DIAGNOSIS — G4733 Obstructive sleep apnea (adult) (pediatric): Secondary | ICD-10-CM | POA: Diagnosis not present

## 2023-05-08 ENCOUNTER — Ambulatory Visit: Payer: 59 | Admitting: Family Medicine

## 2023-05-13 ENCOUNTER — Encounter: Payer: Self-pay | Admitting: Internal Medicine

## 2023-05-13 NOTE — Assessment & Plan Note (Signed)
He benefits from CPAP with satisfactory compliance and control Plan-continue auto 10-20.  He can talk with [facility] about mask style preferences.

## 2023-05-13 NOTE — Assessment & Plan Note (Signed)
Rival BP today 126/74, managed by his PCP

## 2023-05-14 DIAGNOSIS — G4733 Obstructive sleep apnea (adult) (pediatric): Secondary | ICD-10-CM | POA: Diagnosis not present

## 2023-05-22 ENCOUNTER — Ambulatory Visit: Payer: 59 | Admitting: Family Medicine

## 2023-06-09 ENCOUNTER — Other Ambulatory Visit: Payer: Self-pay

## 2023-06-09 ENCOUNTER — Other Ambulatory Visit (HOSPITAL_BASED_OUTPATIENT_CLINIC_OR_DEPARTMENT_OTHER): Payer: Self-pay

## 2023-06-10 ENCOUNTER — Other Ambulatory Visit (HOSPITAL_BASED_OUTPATIENT_CLINIC_OR_DEPARTMENT_OTHER): Payer: Self-pay

## 2023-06-14 DIAGNOSIS — G4733 Obstructive sleep apnea (adult) (pediatric): Secondary | ICD-10-CM | POA: Diagnosis not present

## 2023-07-09 ENCOUNTER — Other Ambulatory Visit: Payer: 59

## 2023-07-09 ENCOUNTER — Other Ambulatory Visit: Payer: Self-pay

## 2023-07-09 DIAGNOSIS — E782 Mixed hyperlipidemia: Secondary | ICD-10-CM

## 2023-07-09 DIAGNOSIS — E1169 Type 2 diabetes mellitus with other specified complication: Secondary | ICD-10-CM

## 2023-07-09 DIAGNOSIS — I10 Essential (primary) hypertension: Secondary | ICD-10-CM

## 2023-07-09 LAB — BAYER DCA HB A1C WAIVED: HB A1C (BAYER DCA - WAIVED): 8 % — ABNORMAL HIGH (ref 4.8–5.6)

## 2023-07-10 ENCOUNTER — Other Ambulatory Visit (HOSPITAL_BASED_OUTPATIENT_CLINIC_OR_DEPARTMENT_OTHER): Payer: Self-pay

## 2023-07-10 ENCOUNTER — Encounter: Payer: Self-pay | Admitting: Family Medicine

## 2023-07-10 ENCOUNTER — Ambulatory Visit (INDEPENDENT_AMBULATORY_CARE_PROVIDER_SITE_OTHER): Payer: 59 | Admitting: Family Medicine

## 2023-07-10 VITALS — BP 133/70 | HR 58 | Ht 67.0 in | Wt 226.0 lb

## 2023-07-10 DIAGNOSIS — Z7984 Long term (current) use of oral hypoglycemic drugs: Secondary | ICD-10-CM | POA: Diagnosis not present

## 2023-07-10 DIAGNOSIS — E1169 Type 2 diabetes mellitus with other specified complication: Secondary | ICD-10-CM

## 2023-07-10 DIAGNOSIS — E782 Mixed hyperlipidemia: Secondary | ICD-10-CM

## 2023-07-10 DIAGNOSIS — I10 Essential (primary) hypertension: Secondary | ICD-10-CM

## 2023-07-10 LAB — CBC WITH DIFFERENTIAL/PLATELET
Basophils Absolute: 0 10*3/uL (ref 0.0–0.2)
Basos: 1 %
EOS (ABSOLUTE): 0.2 10*3/uL (ref 0.0–0.4)
Eos: 3 %
Hematocrit: 44.9 % (ref 37.5–51.0)
Hemoglobin: 15.1 g/dL (ref 13.0–17.7)
Immature Grans (Abs): 0 10*3/uL (ref 0.0–0.1)
Immature Granulocytes: 1 %
Lymphocytes Absolute: 2 10*3/uL (ref 0.7–3.1)
Lymphs: 31 %
MCH: 32.5 pg (ref 26.6–33.0)
MCHC: 33.6 g/dL (ref 31.5–35.7)
MCV: 97 fL (ref 79–97)
Monocytes Absolute: 0.7 10*3/uL (ref 0.1–0.9)
Monocytes: 12 %
Neutrophils Absolute: 3.4 10*3/uL (ref 1.4–7.0)
Neutrophils: 52 %
Platelets: 267 10*3/uL (ref 150–450)
RBC: 4.64 x10E6/uL (ref 4.14–5.80)
RDW: 12.4 % (ref 11.6–15.4)
WBC: 6.3 10*3/uL (ref 3.4–10.8)

## 2023-07-10 LAB — CMP14+EGFR
ALT: 21 IU/L (ref 0–44)
AST: 14 IU/L (ref 0–40)
Albumin: 4.8 g/dL (ref 3.8–4.9)
Alkaline Phosphatase: 59 IU/L (ref 44–121)
BUN/Creatinine Ratio: 19 (ref 9–20)
BUN: 21 mg/dL (ref 6–24)
Bilirubin Total: 0.4 mg/dL (ref 0.0–1.2)
CO2: 23 mmol/L (ref 20–29)
Calcium: 9.6 mg/dL (ref 8.7–10.2)
Chloride: 101 mmol/L (ref 96–106)
Creatinine, Ser: 1.13 mg/dL (ref 0.76–1.27)
Globulin, Total: 2.3 g/dL (ref 1.5–4.5)
Glucose: 156 mg/dL — ABNORMAL HIGH (ref 70–99)
Potassium: 4.6 mmol/L (ref 3.5–5.2)
Sodium: 139 mmol/L (ref 134–144)
Total Protein: 7.1 g/dL (ref 6.0–8.5)
eGFR: 76 mL/min/{1.73_m2} (ref >59)

## 2023-07-10 LAB — LIPID PANEL
Chol/HDL Ratio: 3.6 ratio (ref 0.0–5.0)
Cholesterol, Total: 139 mg/dL (ref 100–199)
HDL: 39 mg/dL — ABNORMAL LOW (ref >39)
LDL Chol Calc (NIH): 43 mg/dL (ref 0–99)
Triglycerides: 388 mg/dL — ABNORMAL HIGH (ref 0–149)
VLDL Cholesterol Cal: 57 mg/dL — ABNORMAL HIGH (ref 5–40)

## 2023-07-10 MED ORDER — RYBELSUS 3 MG PO TABS: 3.0000 mg | ORAL_TABLET | Freq: Every day | ORAL | Status: DC

## 2023-07-10 MED ORDER — RYBELSUS 7 MG PO TABS
7.0000 mg | ORAL_TABLET | Freq: Every day | ORAL | 3 refills | Status: DC
Start: 2023-07-10 — End: 2023-11-10
  Filled 2023-07-10 – 2023-07-14 (×2): qty 30, 30d supply, fill #0
  Filled 2023-09-01: qty 30, 30d supply, fill #1
  Filled 2023-09-30: qty 30, 30d supply, fill #2
  Filled 2023-11-05: qty 30, 30d supply, fill #3

## 2023-07-10 NOTE — Progress Notes (Signed)
BP 133/70   Pulse (!) 58   Ht 5\' 7"  (1.702 m)   Wt 226 lb (102.5 kg)   SpO2 95%   BMI 35.40 kg/m    Subjective:   Patient ID: Jimmy Lysle Rubens., male    DOB: Oct 04, 1966, 57 y.o.   MRN: [id number]  HPI: Jimmy Masaaki Sturgell. is a 57 y.o. male presenting on 07/10/2023 for Medical Management of Chronic Issues, Diabetes, and Hypertension   HPI Type 2 diabetes mellitus Patient comes in today for recheck of his diabetes. Patient has been currently taking Synjardy. Patient is currently on an ACE inhibitor/ARB. Patient has not seen an ophthalmologist this year. Patient denies any new issues with their feet. The symptom started onset as an adult hypertension and hyperlipidemia ARE RELATED TO DM   Hypertension Patient is currently on amlodipine-olmesartan, and their blood pressure today is 133/70. Patient denies any lightheadedness or dizziness. Patient denies headaches, blurred vision, chest pains, shortness of breath, or weakness. Denies any side effects from medication and is content with current medication.   Hyperlipidemia Patient is coming in for recheck of his hyperlipidemia. The patient is currently taking Crestor and fenofibrate and Vascepa. They deny any issues with myalgias or history of liver damage from it. They deny any focal numbness or weakness or chest pain.   Relevant past medical, surgical, family and social history reviewed and updated as indicated. Interim medical history since our last visit reviewed. Allergies and medications reviewed and updated.  Review of Systems  Constitutional:  Negative for chills and fever.  Eyes:  Negative for visual disturbance.  Respiratory:  Negative for shortness of breath and wheezing.   Cardiovascular:  Negative for chest pain and leg swelling.  Musculoskeletal:  Negative for back pain and gait problem.  Skin:  Negative for rash.  Neurological:  Negative for dizziness, light-headedness and headaches.  All other systems reviewed and  are negative.   Per HPI unless specifically indicated above   Allergies as of 07/10/2023   No Known Allergies      Medication List        Accurate as of July 10, 2023  4:58 PM. If you have any questions, ask your nurse or doctor.          amLODipine-olmesartan 5-40 MG tablet Commonly known as: AZOR Take 1 tablet by mouth daily.   aspirin EC 81 MG tablet Take 81 mg by mouth daily.   CALCIUM-MAGNESIUM-ZINC-D3 PO Take 1 tablet by mouth daily at 6 (six) AM.   colchicine 0.6 MG tablet TAKE 1.2 MG (2 TABLETS) BY MOUTH ONCE, THEN TAKE 0.6 MG ONE HOUR LATER. USE AS NEEDED FOR GOUT FLARE. DO NOT REPEAT FOR AT LEAST 3 DAYS.   diclofenac 75 MG EC tablet Commonly known as: VOLTAREN Take 1 tablet (75 mg total) by mouth 2 (two) times daily.   fenofibrate 145 MG tablet Commonly known as: TRICOR Take 1 tablet (145 mg total) by mouth daily.   fluticasone 50 MCG/ACT nasal spray Commonly known as: FLONASE Place 2 sprays into both nostrils daily.   freestyle lancets SMARTSIG:Topical Daily   freestyle lancets Use to check blood sugar up to four times daily as directed.   FreeStyle Lite w/Device Kit Use to check blood sugar up to four times daily as directed.   glucose blood test strip SMARTSIG:Via Meter Daily   FREESTYLE LITE test strip Generic drug: glucose blood Use up to four times daily as directed.   metoprolol succinate 25 MG  24 hr tablet Commonly known as: TOPROL-XL Take 1 tablet (25 mg total) by mouth daily.   nitroGLYCERIN 0.4 MG SL tablet Commonly known as: NITROSTAT Place 1 tablet (0.4 mg total) under the tongue every 5 (five) minutes as needed for chest pain.   pantoprazole 40 MG tablet Commonly known as: PROTONIX Take 1 tablet (40 mg total) by mouth daily.   rosuvastatin 40 MG tablet Commonly known as: CRESTOR Take 1 tablet (40 mg total) by mouth at bedtime.   Rybelsus 7 MG Tabs Generic drug: Semaglutide Take 1 tablet (7 mg total) by mouth  daily. Started by: Elige Radon Inesha Sow   Synjardy 12.5-500 MG Tabs Generic drug: Empagliflozin-metFORMIN HCl Take 1 tablet by mouth daily at 12 noon.   tiZANidine 2 MG tablet Commonly known as: ZANAFLEX Take 1 tablet (2 mg total) by mouth every 6 (six) hours as needed for muscle spasms.   triamcinolone cream 0.1 % Commonly known as: KENALOG Apply to affected area(s) of skin twice daily for 14 days. (apply to skin bid 14 days)   Vascepa 1 g capsule Generic drug: icosapent Ethyl Take 2 capsules (2 g total) by mouth 2 (two) times daily.   Vitamin D 125 MCG (5000 UT) Caps Take 10,000 Units by mouth daily.   ZYRTEC ALLERGY PO Take 1 tablet by mouth daily at 6 (six) AM.         Objective:   BP 133/70   Pulse (!) 58   Ht 5\' 7"  (1.702 m)   Wt 226 lb (102.5 kg)   SpO2 95%   BMI 35.40 kg/m   Wt Readings from Last 3 Encounters:  07/10/23 226 lb (102.5 kg)  03/24/23 234 lb 6.4 oz (106.3 kg)  01/16/23 235 lb (106.6 kg)    Physical Exam Vitals and nursing note reviewed.  Constitutional:      General: He is not in acute distress.    Appearance: He is well-developed. He is not diaphoretic.  Eyes:     General: No scleral icterus.    Conjunctiva/sclera: Conjunctivae normal.  Neck:     Thyroid: No thyromegaly.  Cardiovascular:     Rate and Rhythm: Normal rate and regular rhythm.     Heart sounds: Normal heart sounds. No murmur heard. Pulmonary:     Effort: Pulmonary effort is normal. No respiratory distress.     Breath sounds: Normal breath sounds. No wheezing.  Musculoskeletal:        General: No swelling. Normal range of motion.     Cervical back: Neck supple.  Lymphadenopathy:     Cervical: No cervical adenopathy.  Skin:    General: Skin is warm and dry.     Findings: No rash.  Neurological:     Mental Status: He is alert and oriented to person, place, and time.     Coordination: Coordination normal.  Psychiatric:        Behavior: Behavior normal.      Results for orders placed or performed in visit on 07/09/23  Bayer DCA Hb A1c Waived  Result Value Ref Range   HB A1C (BAYER DCA - WAIVED) 8.0 (H) 4.8 - 5.6 %  Lipid panel  Result Value Ref Range   Cholesterol, Total 139 100 - 199 mg/dL   Triglycerides 161 (H) 0 - 149 mg/dL   HDL 39 (L) >09 mg/dL   VLDL Cholesterol Cal 57 (H) 5 - 40 mg/dL   LDL Chol Calc (NIH) 43 0 - 99 mg/dL   Chol/HDL Ratio  3.6 0.0 - 5.0 ratio  CMP14+EGFR  Result Value Ref Range   Glucose 156 (H) 70 - 99 mg/dL   BUN 21 6 - 24 mg/dL   Creatinine, Ser 6.04 0.76 - 1.27 mg/dL   eGFR 76 >54 UJ/WJX/9.14   BUN/Creatinine Ratio 19 9 - 20   Sodium 139 134 - 144 mmol/L   Potassium 4.6 3.5 - 5.2 mmol/L   Chloride 101 96 - 106 mmol/L   CO2 23 20 - 29 mmol/L   Calcium 9.6 8.7 - 10.2 mg/dL   Total Protein 7.1 6.0 - 8.5 g/dL   Albumin 4.8 3.8 - 4.9 g/dL   Globulin, Total 2.3 1.5 - 4.5 g/dL   Bilirubin Total 0.4 0.0 - 1.2 mg/dL   Alkaline Phosphatase 59 44 - 121 IU/L   AST 14 0 - 40 IU/L   ALT 21 0 - 44 IU/L  CBC with Differential/Platelet  Result Value Ref Range   WBC 6.3 3.4 - 10.8 x10E3/uL   RBC 4.64 4.14 - 5.80 x10E6/uL   Hemoglobin 15.1 13.0 - 17.7 g/dL   Hematocrit 78.2 95.6 - 51.0 %   MCV 97 79 - 97 fL   MCH 32.5 26.6 - 33.0 pg   MCHC 33.6 31.5 - 35.7 g/dL   RDW 21.3 08.6 - 57.8 %   Platelets 267 150 - 450 x10E3/uL   Neutrophils 52 Not Estab. %   Lymphs 31 Not Estab. %   Monocytes 12 Not Estab. %   Eos 3 Not Estab. %   Basos 1 Not Estab. %   Neutrophils Absolute 3.4 1.4 - 7.0 x10E3/uL   Lymphocytes Absolute 2.0 0.7 - 3.1 x10E3/uL   Monocytes Absolute 0.7 0.1 - 0.9 x10E3/uL   EOS (ABSOLUTE) 0.2 0.0 - 0.4 x10E3/uL   Basophils Absolute 0.0 0.0 - 0.2 x10E3/uL   Immature Granulocytes 1 Not Estab. %   Immature Grans (Abs) 0.0 0.0 - 0.1 x10E3/uL    Assessment & Plan:   Problem List Items Addressed This Visit       Cardiovascular and Mediastinum   HTN (hypertension)   Relevant Orders   AMB  Referral to Pharmacy Medication Management     Endocrine   Type 2 diabetes mellitus with other specified complication (HCC) - Primary   Relevant Medications   Semaglutide (RYBELSUS) 7 MG TABS   Other Relevant Orders   AMB Referral to Pharmacy Medication Management     Other   Hyperlipidemia   Relevant Orders   AMB Referral to Pharmacy Medication Management    A1c and triglycerides are up, likely due to blood sugars being up for both of them.  Will add Rybelsus and do referral to pharmacy for diabetes education.  He is going to start with a sample of Rybelsus 3mg  and then increase to Rybelsus 7 mg Follow up plan: Return in about 3 months (around 10/09/2023), or if symptoms worsen or fail to improve, for Diabetes recheck.  Counseling provided for all of the vaccine components Orders Placed This Encounter  Procedures   AMB Referral to Pharmacy Medication Management    Arville Care, MD [facility] 07/10/2023, 4:58 PM

## 2023-07-10 NOTE — Addendum Note (Signed)
Addended by: Dorene Sorrow on: 07/10/2023 05:02 PM   Modules accepted: Orders

## 2023-07-13 ENCOUNTER — Encounter: Payer: Self-pay | Admitting: Family Medicine

## 2023-07-13 ENCOUNTER — Other Ambulatory Visit (HOSPITAL_BASED_OUTPATIENT_CLINIC_OR_DEPARTMENT_OTHER): Payer: Self-pay

## 2023-07-13 MED ORDER — AMLODIPINE BESYLATE 5 MG PO TABS
5.0000 mg | ORAL_TABLET | Freq: Every day | ORAL | 3 refills | Status: DC
  Filled 2023-07-13: qty 90, 90d supply, fill #0

## 2023-07-13 MED ORDER — LOSARTAN POTASSIUM 50 MG PO TABS
50.0000 mg | ORAL_TABLET | Freq: Every day | ORAL | 3 refills | Status: DC
  Filled 2023-07-13: qty 90, 90d supply, fill #0

## 2023-07-14 ENCOUNTER — Other Ambulatory Visit (HOSPITAL_BASED_OUTPATIENT_CLINIC_OR_DEPARTMENT_OTHER): Payer: Self-pay

## 2023-07-15 DIAGNOSIS — G4733 Obstructive sleep apnea (adult) (pediatric): Secondary | ICD-10-CM | POA: Diagnosis not present

## 2023-07-27 ENCOUNTER — Ambulatory Visit: Payer: No Typology Code available for payment source | Admitting: Internal Medicine

## 2023-08-14 DIAGNOSIS — G4733 Obstructive sleep apnea (adult) (pediatric): Secondary | ICD-10-CM | POA: Diagnosis not present

## 2023-08-25 ENCOUNTER — Other Ambulatory Visit (INDEPENDENT_AMBULATORY_CARE_PROVIDER_SITE_OTHER): Payer: 59

## 2023-08-25 ENCOUNTER — Other Ambulatory Visit (HOSPITAL_BASED_OUTPATIENT_CLINIC_OR_DEPARTMENT_OTHER): Payer: Self-pay

## 2023-08-25 DIAGNOSIS — E1169 Type 2 diabetes mellitus with other specified complication: Secondary | ICD-10-CM

## 2023-08-25 MED ORDER — SYNJARDY 12.5-500 MG PO TABS
1.0000 | ORAL_TABLET | Freq: Two times a day (BID) | ORAL | 3 refills | Status: DC
  Filled 2023-08-25: qty 180, 90d supply, fill #0

## 2023-08-26 ENCOUNTER — Other Ambulatory Visit: Payer: Self-pay

## 2023-09-01 ENCOUNTER — Other Ambulatory Visit (HOSPITAL_COMMUNITY): Payer: Self-pay

## 2023-09-02 ENCOUNTER — Other Ambulatory Visit (HOSPITAL_BASED_OUTPATIENT_CLINIC_OR_DEPARTMENT_OTHER): Payer: Self-pay

## 2023-09-02 ENCOUNTER — Ambulatory Visit (INDEPENDENT_AMBULATORY_CARE_PROVIDER_SITE_OTHER): Payer: 59 | Admitting: Family Medicine

## 2023-09-02 ENCOUNTER — Other Ambulatory Visit: Payer: Self-pay

## 2023-09-02 ENCOUNTER — Encounter: Payer: Self-pay | Admitting: Family Medicine

## 2023-09-02 VITALS — BP 127/71 | HR 83 | Ht 67.0 in | Wt 229.0 lb

## 2023-09-02 DIAGNOSIS — S39012A Strain of muscle, fascia and tendon of lower back, initial encounter: Secondary | ICD-10-CM

## 2023-09-02 MED ORDER — TIZANIDINE HCL 2 MG PO TABS
2.0000 mg | ORAL_TABLET | Freq: Four times a day (QID) | ORAL | 1 refills | Status: DC | PRN
  Filled 2023-09-02: qty 60, 15d supply, fill #0

## 2023-09-02 NOTE — Progress Notes (Signed)
BP 127/71   Pulse 83   Ht 5\' 7"  (1.702 m)   Wt 229 lb (103.9 kg)   SpO2 94%   BMI 35.87 kg/m    Subjective:   Patient ID: Jimmy Lysle Rubens., male    DOB: 08-04-1966, 57 y.o.   MRN: [id number]  HPI: Jimmy Nemiah Kissner. is a 57 y.o. male presenting on 09/02/2023 for Back Pain (Lower, denies injury)   HPI Low back pain Patient comes in complaining of low back pain, worse on the left lower back than the right lower back but it is on both sides and he will feel it more in the morning and that it will loosen up as he gets moving around throughout the day.  He has been using a muscle relaxer at times to help with it and then doing some massage to help with that as well.  It has been helping but then he also did some other stuff this weekend with lifting that may have made it worse.  He thinks maybe it started couple weeks ago after he was on vacation and doing a lot of stuff with the grandkids.  Relevant past medical, surgical, family and social history reviewed and updated as indicated. Interim medical history since our last visit reviewed. Allergies and medications reviewed and updated.  Review of Systems  Constitutional:  Negative for chills and fever.  Eyes:  Negative for visual disturbance.  Respiratory:  Negative for shortness of breath and wheezing.   Cardiovascular:  Negative for chest pain and leg swelling.  Musculoskeletal:  Positive for arthralgias and back pain. Negative for gait problem.  Skin:  Negative for rash.  Neurological:  Negative for dizziness and light-headedness.  All other systems reviewed and are negative.   Per HPI unless specifically indicated above   Allergies as of 09/02/2023   No Known Allergies      Medication List        Accurate as of September 02, 2023  2:07 PM. If you have any questions, ask your nurse or doctor.          amLODipine 5 MG tablet Commonly known as: NORVASC Take 1 tablet (5 mg total) by mouth daily.   aspirin EC 81  MG tablet Take 81 mg by mouth daily.   CALCIUM-MAGNESIUM-ZINC-D3 PO Take 1 tablet by mouth daily at 6 (six) AM.   colchicine 0.6 MG tablet TAKE 1.2 MG (2 TABLETS) BY MOUTH ONCE, THEN TAKE 0.6 MG ONE HOUR LATER. USE AS NEEDED FOR GOUT FLARE. DO NOT REPEAT FOR AT LEAST 3 DAYS.   fenofibrate 145 MG tablet Commonly known as: TRICOR Take 1 tablet (145 mg total) by mouth daily.   fluticasone 50 MCG/ACT nasal spray Commonly known as: FLONASE Place 2 sprays into both nostrils daily.   freestyle lancets Use to check blood sugar up to four times daily as directed. What changed: Another medication with the same name was removed. Continue taking this medication, and follow the directions you see here. Changed by: Elige Radon Copelyn Widmer   FREESTYLE LITE test strip Generic drug: glucose blood Use up to four times daily as directed. What changed: Another medication with the same name was removed. Continue taking this medication, and follow the directions you see here. Changed by: Elige Radon Sion Thane   FreeStyle Lite w/Device Kit Use to check blood sugar up to four times daily as directed.   losartan 50 MG tablet Commonly known as: COZAAR Take 1 tablet (50 mg total)  by mouth daily.   metoprolol succinate 25 MG 24 hr tablet Commonly known as: TOPROL-XL Take 1 tablet (25 mg total) by mouth daily.   nitroGLYCERIN 0.4 MG SL tablet Commonly known as: NITROSTAT Place 1 tablet (0.4 mg total) under the tongue every 5 (five) minutes as needed for chest pain.   pantoprazole 40 MG tablet Commonly known as: PROTONIX Take 1 tablet (40 mg total) by mouth daily.   rosuvastatin 40 MG tablet Commonly known as: CRESTOR Take 1 tablet (40 mg total) by mouth at bedtime.   Rybelsus 7 MG Tabs Generic drug: Semaglutide Take 1 tablet (7 mg total) by mouth daily. What changed: Another medication with the same name was removed. Continue taking this medication, and follow the directions you see here. Changed  by: Elige Radon Alexandrya Chim   Synjardy 12.5-500 MG Tabs Generic drug: Empagliflozin-metFORMIN HCl Take 1 tablet by mouth in the morning and at bedtime.   tiZANidine 2 MG tablet Commonly known as: ZANAFLEX Take 1 tablet (2 mg total) by mouth every 6 (six) hours as needed for muscle spasms.   triamcinolone cream 0.1 % Commonly known as: KENALOG Apply to affected area(s) of skin twice daily for 14 days. (apply to skin bid 14 days)   Vascepa 1 g capsule Generic drug: icosapent Ethyl Take 2 capsules (2 g total) by mouth 2 (two) times daily.   Vitamin D 125 MCG (5000 UT) Caps Take 10,000 Units by mouth daily.   ZYRTEC ALLERGY PO Take 1 tablet by mouth daily at 6 (six) AM.         Objective:   BP 127/71   Pulse 83   Ht 5\' 7"  (1.702 m)   Wt 229 lb (103.9 kg)   SpO2 94%   BMI 35.87 kg/m   Wt Readings from Last 3 Encounters:  09/02/23 229 lb (103.9 kg)  07/10/23 226 lb (102.5 kg)  03/24/23 234 lb 6.4 oz (106.3 kg)    Physical Exam Vitals and nursing note reviewed.  Constitutional:      General: He is not in acute distress.    Appearance: He is well-developed. He is not diaphoretic.  Eyes:     General: No scleral icterus.    Conjunctiva/sclera: Conjunctivae normal.  Neck:     Thyroid: No thyromegaly.  Cardiovascular:     Rate and Rhythm: Normal rate and regular rhythm.     Heart sounds: Normal heart sounds. No murmur heard. Pulmonary:     Effort: Pulmonary effort is normal. No respiratory distress.     Breath sounds: Normal breath sounds. No wheezing.  Musculoskeletal:        General: Normal range of motion.     Cervical back: Neck supple.     Lumbar back: Tenderness present. No swelling, deformity or bony tenderness. Normal range of motion. Negative right straight leg raise test and negative left straight leg raise test.       Back:  Lymphadenopathy:     Cervical: No cervical adenopathy.  Skin:    General: Skin is warm and dry.     Findings: No rash.   Neurological:     Mental Status: He is alert and oriented to person, place, and time.     Coordination: Coordination normal.  Psychiatric:        Behavior: Behavior normal.       Assessment & Plan:   Problem List Items Addressed This Visit   None Visit Diagnoses     Strain of lumbar region, initial  encounter    -  Primary   Relevant Medications   tiZANidine (ZANAFLEX) 2 MG tablet       Recommended use of the muscle relaxer in the evening and ibuprofen in the morning and using the exercises that we give him daily. Follow up plan: Return if symptoms worsen or fail to improve.  Counseling provided for all of the vaccine components No orders of the defined types were placed in this encounter.   Arville Care, MD [facility] 09/02/2023, 2:07 PM

## 2023-09-03 ENCOUNTER — Other Ambulatory Visit (HOSPITAL_BASED_OUTPATIENT_CLINIC_OR_DEPARTMENT_OTHER): Payer: Self-pay

## 2023-09-17 ENCOUNTER — Other Ambulatory Visit (HOSPITAL_BASED_OUTPATIENT_CLINIC_OR_DEPARTMENT_OTHER): Payer: Self-pay

## 2023-09-17 ENCOUNTER — Other Ambulatory Visit: Payer: Self-pay | Admitting: Family Medicine

## 2023-09-17 MED ORDER — BUSPIRONE HCL 15 MG PO TABS
15.0000 mg | ORAL_TABLET | Freq: Two times a day (BID) | ORAL | 1 refills | Status: DC | PRN
  Filled 2023-09-17: qty 180, 90d supply, fill #0

## 2023-09-19 ENCOUNTER — Other Ambulatory Visit (HOSPITAL_BASED_OUTPATIENT_CLINIC_OR_DEPARTMENT_OTHER): Payer: Self-pay

## 2023-09-30 ENCOUNTER — Other Ambulatory Visit: Payer: Self-pay

## 2023-09-30 ENCOUNTER — Other Ambulatory Visit (HOSPITAL_BASED_OUTPATIENT_CLINIC_OR_DEPARTMENT_OTHER): Payer: Self-pay

## 2023-10-02 ENCOUNTER — Other Ambulatory Visit (HOSPITAL_BASED_OUTPATIENT_CLINIC_OR_DEPARTMENT_OTHER): Payer: Self-pay

## 2023-10-04 ENCOUNTER — Other Ambulatory Visit (HOSPITAL_BASED_OUTPATIENT_CLINIC_OR_DEPARTMENT_OTHER): Payer: Self-pay

## 2023-10-05 ENCOUNTER — Telehealth: Payer: Self-pay

## 2023-10-05 ENCOUNTER — Other Ambulatory Visit (HOSPITAL_BASED_OUTPATIENT_CLINIC_OR_DEPARTMENT_OTHER): Payer: Self-pay

## 2023-10-05 NOTE — Telephone Encounter (Signed)
[name] (KeyGerome Apley) Rx #: [id number] Rybelsus 7MG  tablets Form [employer] Electronic PA Form [phone] NCPDP) Created 5 days ago Sent to Plan 3 minutes ago Plan Response 3 minutes ago Submit Clinical Questions 1 minute ago Determination Favorable less than a minute ago Message from [employer] Your PA request has been approved. Additional information will be provided in the approval communication. (Message 1145). Authorization Expiration Date: October 04, 2026.  Pharmacy and patient informed

## 2023-10-05 NOTE — Telephone Encounter (Signed)
Jimmy Perez (Jimmy Perez) Rx #: [id number] Rybelsus 7MG  tablets Form [employer] Electronic PA Form [phone] NCPDP) Created 5 days ago Sent to Plan 2 minutes ago Plan Response 2 minutes ago Submit Clinical Questions less than a minute ago Determination Wait for Determination Please wait for [employer] NCPDP 2017 to return a determination.

## 2023-10-29 ENCOUNTER — Other Ambulatory Visit: Payer: Self-pay

## 2023-10-29 DIAGNOSIS — E782 Mixed hyperlipidemia: Secondary | ICD-10-CM

## 2023-10-29 MED ORDER — ROSUVASTATIN CALCIUM 40 MG PO TABS: 40.0000 mg | ORAL_TABLET | Freq: Every day | ORAL | 1 refills | Status: DC

## 2023-10-29 MED ORDER — FENOFIBRATE 145 MG PO TABS: 145.0000 mg | ORAL_TABLET | Freq: Every day | ORAL | 1 refills | Status: DC

## 2023-11-05 ENCOUNTER — Other Ambulatory Visit: Payer: Self-pay

## 2023-11-10 ENCOUNTER — Encounter: Payer: Self-pay | Admitting: Pharmacist

## 2023-11-10 ENCOUNTER — Ambulatory Visit (INDEPENDENT_AMBULATORY_CARE_PROVIDER_SITE_OTHER): Payer: 59 | Admitting: Pharmacist

## 2023-11-10 ENCOUNTER — Other Ambulatory Visit: Payer: Self-pay | Admitting: Family Medicine

## 2023-11-10 DIAGNOSIS — E1169 Type 2 diabetes mellitus with other specified complication: Secondary | ICD-10-CM

## 2023-11-10 DIAGNOSIS — E782 Mixed hyperlipidemia: Secondary | ICD-10-CM | POA: Diagnosis not present

## 2023-11-10 MED ORDER — RYBELSUS 14 MG PO TABS: 14.0000 mg | ORAL_TABLET | Freq: Every day | ORAL | 5 refills | Status: DC

## 2023-11-10 NOTE — Progress Notes (Signed)
 11/10/2023 Name: Jimmy Perez. MRN: [id number] DOB: 19-Oct-1966  Chief Complaint  Patient presents with   Diabetes    Jimmy Perez. is a 58 y.o. year old male who was referred for medication management by their primary care provider, Dettinger, Fonda LABOR, MD. They presented for a face to face visit today.   They were referred to the pharmacist by their PCP for assistance in managing diabetes    Subjective: Patient reports he is doing well. Has been working to improve his diet and be more mindful about portion sizes.   Care Team: Primary Care Provider: Dettinger, Fonda LABOR, MD ; Next Scheduled Visit: 12/04/23  Medication Access/Adherence  Current Pharmacy:  [facility] [address] [place] [address] Phone: [phone] Fax: [phone]  [facility] - [place], [place] - [facility] AT Portal to [facility] [address] [place] [address] Phone: [phone] Fax: [phone]   Patient reports affordability concerns with their medications: No  Patient reports access/transportation concerns to their pharmacy: No  Patient reports adherence concerns with their medications:  No     Diabetes:  Current medications: Synjardy  12.5-500 mg BID, Rybelsus  7 mg daily Medications tried in the past: Trulicity  (black stools, stomach pain after dose was injected 5 days later), Januvia  (sitagliptin )   Current glucose readings: FBG 120-130s Using glucometer; testing 2 times daily  Patient denies hypoglycemic s/sx including dizziness, shakiness, sweating. Patient denies hyperglycemic symptoms including polyuria, polydipsia, polyphagia, nocturia, neuropathy, blurred vision.  Current meal patterns: 2 meals/day - Breakfast: coffee, bagel, wrap, sausage links, maple syrup - Supper: mindful of portions, tries to limit fried foods, has been more disciplined lately -  Drinks: has cut out most soda - now only having 1/2 a spite every 4 days, has cut down on sweet tea (half/half tea twice a week).   Current physical activity: currently limited, however he hopes to increase physical activity when weather gets warmer- disc golf and riding his bike  Hyperlipidemia/ASCVD Risk Reduction  Current lipid lowering medications: Vascepa  2 g BID, rosuvastatin  40 mg daily, fenofibrate  145 mg daily  Antiplatelet regimen: aspirin  81 mg daily  Patient reports improved adherence to aspirin  since last visit.  ASCVD History: PCI 2019 - DES placed to 90% stenosis MRG  Risk Factors: T2DM, HTN, HLD, angina, prev PCI    Objective:  Lab Results  Component Value Date   HGBA1C 8.0 (H) 07/09/2023    Lab Results  Component Value Date   CREATININE 1.13 07/09/2023   BUN 21 07/09/2023   NA 139 07/09/2023   K 4.6 07/09/2023   CL 101 07/09/2023   CO2 23 07/09/2023    Lab Results  Component Value Date   CHOL 139 07/09/2023   HDL 39 (L) 07/09/2023   LDLCALC 43 07/09/2023   LDLDIRECT 88 06/06/2015   TRIG 388 (H) 07/09/2023   CHOLHDL 3.6 07/09/2023    Medications Reviewed Today     Reviewed by Bonnell Izetta SAUNDERS, RPH (Pharmacist) on 11/10/23 at 1556  Med List Status: <None>   Medication Order Taking? Sig Documenting Provider Last Dose Status Informant  amLODipine  (NORVASC ) 5 MG tablet [id number]  Take 1 tablet (5 mg total) by mouth daily. Dettinger, Fonda LABOR, MD  Active   aspirin  EC 81 MG tablet [id number] Yes Take 81 mg by mouth daily. [provider] Taking Active            Med Note (  BRINDA LORAIN SQUIBB   Tue Aug 25, 2023  3:31 PM) Not taking on a regular basis  Blood Glucose Monitoring Suppl (FREESTYLE LITE) w/Device KIT [id number]  Use to check blood sugar up to four times daily as directed.   Active   busPIRone  (BUSPAR ) 15 MG tablet [id number]  Take 1 tablet (15 mg total) by mouth 2 (two) times daily as needed. Dettinger, Fonda LABOR, MD  Active   Cetirizine HCl  (ZYRTEC ALLERGY PO) [id number]  Take 1 tablet by mouth daily at 6 (six) AM. [provider]  Active            Med Note DELSA LORAIN SQUIBB   Tue Aug 25, 2023  3:31 PM) PRN  Cholecalciferol (VITAMIN D ) 125 MCG (5000 UT) CAPS [id number]  Take 10,000 Units by mouth daily. [provider]  Active   colchicine  0.6 MG tablet [id number]  TAKE 1.2 MG (2 TABLETS) BY MOUTH ONCE, THEN TAKE 0.6 MG ONE HOUR LATER. USE AS NEEDED FOR GOUT FLARE. DO NOT REPEAT FOR AT LEAST 3 DAYS. Dettinger, Fonda LABOR, MD  Active            Med Note DELSA LORAIN SQUIBB   Tue Aug 25, 2023  3:31 PM) PRN - last dose 2.5 months ago  Empagliflozin -metFORMIN  HCl (SYNJARDY ) 12.5-500 MG TABS [id number] Yes Take 1 tablet by mouth in the morning and at bedtime. Dettinger, Fonda LABOR, MD Taking Active   fenofibrate  (TRICOR ) 145 MG tablet [id number]  Take 1 tablet (145 mg total) by mouth daily. Dettinger, Fonda LABOR, MD  Active   fluticasone  (FLONASE ) 50 MCG/ACT nasal spray [id number]  Place 2 sprays into both nostrils daily. Dettinger, Fonda LABOR, MD  Active            Med Note DELSA LORAIN SQUIBB   Tue Aug 25, 2023  3:32 PM) PRN  glucose blood test strip [id number]  Use up to four times daily as directed. Dettinger, Fonda LABOR, MD  Active   icosapent  Ethyl (VASCEPA ) 1 g capsule [id number]  Take 2 capsules (2 g total) by mouth 2 (two) times daily. Dettinger, Fonda LABOR, MD  Active   Lancets (FREESTYLE) lancets [id number]  Use to check blood sugar up to four times daily as directed.   Active   losartan  (COZAAR ) 50 MG tablet [id number]  Take 1 tablet (50 mg total) by mouth daily. Dettinger, Fonda LABOR, MD  Active   metoprolol  succinate (TOPROL -XL) 25 MG 24 hr tablet [id number]  Take 1 tablet (25 mg total) by mouth daily. Dettinger, Fonda LABOR, MD  Active   Multiple Minerals-Vitamins (CALCIUM -MAGNESIUM -ZINC-D3 PO) [id number]  Take 1 tablet by mouth daily at 6 (six) AM. [provider]  Active   nitroGLYCERIN  (NITROSTAT ) 0.4 MG SL tablet [id number]  Place 1  tablet (0.4 mg total) under the tongue every 5 (five) minutes as needed for chest pain. Dettinger, Fonda LABOR, MD  Expired 01/14/23 2359   pantoprazole  (PROTONIX ) 40 MG tablet [id number]  Take 1 tablet (40 mg total) by mouth daily. Dettinger, Fonda LABOR, MD  Active   rosuvastatin  (CRESTOR ) 40 MG tablet [id number]  Take 1 tablet (40 mg total) by mouth at bedtime. Dettinger, Fonda LABOR, MD  Active   Semaglutide  (RYBELSUS ) 14 MG TABS [id number] Yes Take 1 tablet (14 mg total) by mouth daily. Dettinger, Fonda LABOR, MD  Active   tiZANidine  (ZANAFLEX ) 2 MG tablet [id number]  Take 1 tablet (2 mg total) by mouth every 6 (six) hours as needed for muscle  spasms. Dettinger, Fonda LABOR, MD  Active   triamcinolone  cream (KENALOG ) 0.1 % [id number]  apply to skin bid 14 days   Active               Assessment/Plan:   Diabetes: - Currently uncontrolled based on last A1c 8.0% above goal <7%. Patient reported FBG slightly above goal as well with readings ranging 120-130s. Patient appears motivated to make improvements in his diet and lifestyle to improve diabetes control. - Reviewed long term cardiovascular and renal outcomes of uncontrolled blood sugar - Reviewed goal A1c, goal fasting, and goal 2 hour post prandial glucose - Reviewed dietary modifications including limiting intake of carbohydrates, increasing intake of vegetables, and limiting portion sizes. Discussed my plate method and provided patient with diabetic diet education materials. - Reviewed lifestyle modifications including: encouraged him to incorporate physical activity  - Recommend to increase Rybelsus  to 14 mg daily as he is tolerating this well. Provided him with 1 month sample today. - Recommend to continue Synjardy  12.5-500 mg twice daily  - Patient denies personal or family history of multiple endocrine neoplasia type 2, medullary thyroid  cancer; personal history of pancreatitis or gallbladder disease. - F/u labs - obtained UACR today and ordered A1c  to be collected with other labs ordered by PCP prior to visit on 12/04/23 - Recommend to fasting BG once daily  Hyperlipidemia/ASCVD Risk Reduction: - Currently controlled with LDL-C <55 mg/dL - more stringent goal appropriate with hx of clinical ASCVD. TG have been elevated on icosapent  ethyl, fenofibrate , and max-dose rosuvastatin , but likely related to elevated BG. Pt reported improved adherence to aspirin  81 mg daily. Encouraged him to continue adherence. - Reviewed long term complications of uncontrolled cholesterol - Recommend to continue rosuvastatin  40 mg daily, Vascepa  2 g BID, fenofibrate  145 mg daily, and aspirin  81 mg daily    Follow Up Plan: PCP on 12/04/23 and PharmD on 12/29/23   Izetta Henry, PharmD PGY-1 Pharmacy Resident  Mliss Tarry Griffin, PharmD, BCACP, CPP Clinical Pharmacist, [facility]

## 2023-11-11 LAB — MICROALBUMIN / CREATININE URINE RATIO
Creatinine, Urine: 18.1 mg/dL
Microalb/Creat Ratio: 92 mg/g{creat} — ABNORMAL HIGH (ref 0–29)
Microalbumin, Urine: 16.7 ug/mL

## 2023-11-30 ENCOUNTER — Other Ambulatory Visit: Payer: Self-pay

## 2023-11-30 DIAGNOSIS — I10 Essential (primary) hypertension: Secondary | ICD-10-CM

## 2023-11-30 DIAGNOSIS — K219 Gastro-esophageal reflux disease without esophagitis: Secondary | ICD-10-CM

## 2023-11-30 DIAGNOSIS — E1169 Type 2 diabetes mellitus with other specified complication: Secondary | ICD-10-CM

## 2023-11-30 DIAGNOSIS — E782 Mixed hyperlipidemia: Secondary | ICD-10-CM

## 2023-11-30 MED ORDER — ICOSAPENT ETHYL 1 G PO CAPS: 2.0000 g | ORAL_CAPSULE | Freq: Two times a day (BID) | ORAL | 3 refills | Status: AC

## 2023-11-30 MED ORDER — RYBELSUS 14 MG PO TABS: 1.0000 | ORAL_TABLET | Freq: Every day | ORAL | 0 refills | Status: DC

## 2023-11-30 MED ORDER — PANTOPRAZOLE SODIUM 40 MG PO TBEC: 40.0000 mg | DELAYED_RELEASE_TABLET | Freq: Every day | ORAL | 3 refills | Status: DC

## 2023-11-30 MED ORDER — METOPROLOL SUCCINATE ER 25 MG PO TB24: 25.0000 mg | ORAL_TABLET | Freq: Every day | ORAL | 3 refills | Status: AC

## 2023-11-30 MED ORDER — SYNJARDY 12.5-500 MG PO TABS: 1.0000 | ORAL_TABLET | Freq: Two times a day (BID) | ORAL | 3 refills | Status: DC

## 2023-11-30 MED ORDER — AMLODIPINE BESYLATE 5 MG PO TABS: 5.0000 mg | ORAL_TABLET | Freq: Every day | ORAL | 3 refills | Status: AC

## 2023-11-30 MED ORDER — LOSARTAN POTASSIUM 50 MG PO TABS: 50.0000 mg | ORAL_TABLET | Freq: Every day | ORAL | 3 refills | Status: AC

## 2023-12-03 ENCOUNTER — Telehealth: Payer: Self-pay

## 2023-12-03 ENCOUNTER — Telehealth: Payer: 59 | Admitting: Family

## 2023-12-03 ENCOUNTER — Other Ambulatory Visit (HOSPITAL_COMMUNITY): Payer: Self-pay

## 2023-12-03 ENCOUNTER — Telehealth: Payer: Self-pay | Admitting: Pharmacist

## 2023-12-03 ENCOUNTER — Encounter: Payer: Self-pay | Admitting: Family

## 2023-12-03 DIAGNOSIS — R0981 Nasal congestion: Secondary | ICD-10-CM | POA: Diagnosis not present

## 2023-12-03 MED ORDER — AMOXICILLIN-POT CLAVULANATE 875-125 MG PO TABS: 1.0000 | ORAL_TABLET | Freq: Two times a day (BID) | ORAL | 0 refills | Status: DC

## 2023-12-03 NOTE — Progress Notes (Addendum)
 Virtual Visit Consent   Anders Francis Ina Raddle., you are scheduled for a virtual visit with a [facility] provider today. Just as with appointments in the office, your consent must be obtained to participate. Your consent will be active for this visit and any virtual visit you may have with one of our providers in the next 365 days. If you have a MyChart account, a copy of this consent can be sent to you electronically.  As this is a virtual visit, video technology does not allow for your provider to perform a traditional examination. This may limit your provider's ability to fully assess your condition. If your provider identifies any concerns that need to be evaluated in person or the need to arrange testing (such as labs, EKG, etc.), we will make arrangements to do so. Although advances in technology are sophisticated, we cannot ensure that it will always work on either your end or our end. If the connection with a video visit is poor, the visit may have to be switched to a telephone visit. With either a video or telephone visit, we are not always able to ensure that we have a secure connection.  By engaging in this virtual visit, you consent to the provision of healthcare and authorize for your insurance to be billed (if applicable) for the services provided during this visit. Depending on your insurance coverage, you may receive a charge related to this service.  I need to obtain your verbal consent now. Are you willing to proceed with your visit today? Stafford Francis Ina Raddle. has provided verbal consent on 12/03/2023 for a virtual visit (video or telephone). Bari Learn, FNP  Date: 12/03/2023 12:45 PM  Virtual Visit via Video Note   I, Bari Learn, connected with  Corgan Vitor Overbaugh.  ([id number], [birth year]) on 12/03/23 at  5:00 PM EST by a video-enabled telemedicine application and verified that I am speaking with the correct person using two identifiers.  Location: Patient: Virtual Visit  Location Patient: Home Provider: Virtual Visit Location Provider: Home Office   I discussed the limitations of evaluation and management by telemedicine and the availability of in person appointments. The patient expressed understanding and agreed to proceed.    History of Present Illness: Oluwadamilare Jondavid Schreier. is a 58 y.o. who identifies as a male who was assigned male at birth, and is being seen today for sinus congestion and right ear fullness and pain that started yesterday.   HPI: Sinusitis This is a new problem. The current episode started yesterday. The problem is unchanged. There has been no fever. His pain is at a severity of 5/10. The pain is moderate. Associated symptoms include congestion, ear pain (right), headaches, a hoarse voice, sinus pressure, sneezing and a sore throat. Pertinent negatives include no shortness of breath. Past treatments include oral decongestants and acetaminophen . The treatment provided mild relief.    Problems:  Patient Active Problem List   Diagnosis Date Noted   Vitamin D  deficiency 10/11/2020   Type 2 diabetes mellitus with other specified complication (HCC) 12/14/2017   Obstructive sleep apnea 11/06/2014   Metabolic syndrome 04/14/2014   HTN (hypertension) 04/18/2013   Hyperlipidemia 04/18/2013   GERD (gastroesophageal reflux disease) 04/18/2013   Morbid obesity (HCC) 04/18/2013    Allergies:  Allergies  Allergen Reactions   Trulicity  Darian.cumins ] Nausea Only   Medications:  Current Outpatient Medications:    amLODipine  (NORVASC ) 5 MG tablet, Take 1 tablet (5 mg total) by mouth daily., Disp: 90 tablet,  Rfl: 3   aspirin  EC 81 MG tablet, Take 81 mg by mouth daily., Disp: , Rfl:    Blood Glucose Monitoring Suppl (FREESTYLE LITE) w/Device KIT, Use to check blood sugar up to four times daily as directed., Disp: 1 kit, Rfl: 0   busPIRone  (BUSPAR ) 15 MG tablet, Take 1 tablet (15 mg total) by mouth 2 (two) times daily as needed., Disp: 180 tablet,  Rfl: 1   Cetirizine HCl (ZYRTEC ALLERGY PO), Take 1 tablet by mouth daily at 6 (six) AM., Disp: , Rfl:    Cholecalciferol (VITAMIN D ) 125 MCG (5000 UT) CAPS, Take 10,000 Units by mouth daily., Disp: , Rfl:    colchicine  0.6 MG tablet, TAKE 1.2 MG (2 TABLETS) BY MOUTH ONCE, THEN TAKE 0.6 MG ONE HOUR LATER. USE AS NEEDED FOR GOUT FLARE. DO NOT REPEAT FOR AT LEAST 3 DAYS., Disp: 9 tablet, Rfl: 0   Empagliflozin -metFORMIN  HCl (SYNJARDY ) 12.5-500 MG TABS, Take 1 tablet by mouth in the morning and at bedtime., Disp: 180 tablet, Rfl: 3   fenofibrate  (TRICOR ) 145 MG tablet, Take 1 tablet (145 mg total) by mouth daily., Disp: 90 tablet, Rfl: 1   fluticasone  (FLONASE ) 50 MCG/ACT nasal spray, Place 2 sprays into both nostrils daily., Disp: 16 g, Rfl: 10   glucose blood test strip, Use up to four times daily as directed., Disp: 100 each, Rfl: 0   icosapent  Ethyl (VASCEPA ) 1 g capsule, Take 2 capsules (2 g total) by mouth 2 (two) times daily., Disp: 360 capsule, Rfl: 3   Lancets (FREESTYLE) lancets, Use to check blood sugar up to four times daily as directed., Disp: 100 each, Rfl: 0   losartan  (COZAAR ) 50 MG tablet, Take 1 tablet (50 mg total) by mouth daily., Disp: 90 tablet, Rfl: 3   metoprolol  succinate (TOPROL -XL) 25 MG 24 hr tablet, Take 1 tablet (25 mg total) by mouth daily., Disp: 90 tablet, Rfl: 3   Multiple Minerals-Vitamins (CALCIUM -MAGNESIUM -ZINC-D3 PO), Take 1 tablet by mouth daily at 6 (six) AM., Disp: , Rfl:    nitroGLYCERIN  (NITROSTAT ) 0.4 MG SL tablet, Place 1 tablet (0.4 mg total) under the tongue every 5 (five) minutes as needed for chest pain., Disp: 25 tablet, Rfl: 1   pantoprazole  (PROTONIX ) 40 MG tablet, Take 1 tablet (40 mg total) by mouth daily., Disp: 90 tablet, Rfl: 3   rosuvastatin  (CRESTOR ) 40 MG tablet, Take 1 tablet (40 mg total) by mouth at bedtime., Disp: 90 tablet, Rfl: 1   Semaglutide  (RYBELSUS ) 14 MG TABS, Take 1 tablet (14 mg total) by mouth daily., Disp: 90 tablet, Rfl: 0    tiZANidine  (ZANAFLEX ) 2 MG tablet, Take 1 tablet (2 mg total) by mouth every 6 (six) hours as needed for muscle spasms., Disp: 60 tablet, Rfl: 1   triamcinolone  cream (KENALOG ) 0.1 %, apply to skin bid 14 days, Disp: 60 g, Rfl: 0  Observations/Objective: Patient is well-developed, well-nourished in no acute distress.  Resting comfortably  at home.  Head is normocephalic, atraumatic.  No labored breathing.  Speech is clear and coherent with logical content.  Patient is alert and oriented at baseline.  Nasal congestion, hoarse voice   Assessment and Plan: 1. Sinus congestion (Primary)  Pt will do home COVID/Flu to rule out - Take meds as prescribed - Use a cool mist humidifier  -Use saline nose sprays frequently -Force fluids -For any cough or congestion  Use plain Mucinex- regular strength or max strength is fine -For fever or aces or pains- take  tylenol  or ibuprofen. -Throat lozenges if help -He will call office and let me know results and I will send in medication accordingly    Flu and COVID negative, will send in Augmentin . Recommend waiting for a few days to see if symptoms worsen to start.   Follow Up Instructions: I discussed the assessment and treatment plan with the patient. The patient was provided an opportunity to ask questions and all were answered. The patient agreed with the plan and demonstrated an understanding of the instructions.  A copy of instructions were sent to the patient via MyChart unless otherwise noted below.     The patient was advised to call back or seek an in-person evaluation if the symptoms worsen or if the condition fails to improve as anticipated.    Bari Learn, FNP

## 2023-12-03 NOTE — Addendum Note (Signed)
 Addended by: Tommas Fragmin A on: 12/03/2023 02:54 PM   Modules accepted: Orders

## 2023-12-03 NOTE — Telephone Encounter (Signed)
 Pharmacy Patient Advocate Encounter   Received notification from  [facility] Portal that prior authorization for Synjardy  12.5-500MG  tablets is required/requested.   Insurance verification completed.   The patient is insured through [facility] .   Per test claim: PA required; PA submitted to above mentioned insurance via CoverMyMeds Key/confirmation #/EOC [id number] Status is pending

## 2023-12-03 NOTE — Telephone Encounter (Signed)
 Pharmacy Patient Advocate Encounter  Received notification from [facility] that Prior Authorization for Synjardy  12.5-500MG  tablets has been APPROVED from 12/03/23 to 12/01/24. Ran test claim, Copay is $44.99 for a 90 day supply. This test claim was processed through [facility]- copay amounts may vary at other pharmacies due to pharmacy/plan contracts, or as the patient moves through the different stages of their insurance plan.   PA #/Case ID/Reference #:  [id number]

## 2023-12-03 NOTE — Telephone Encounter (Signed)
 Pharmacy Patient Advocate Encounter  Received notification from [employer] that Prior Authorization for Pantoprazole  Sodium 40MG  dr tablets has been APPROVED from 12/03/23 to 12/01/24. Unable to obtain price due to refill too soon rejection, last fill date 09/30/23 next available fill date02/14/25   PA #/Case ID/Reference #: [id number]

## 2023-12-04 ENCOUNTER — Ambulatory Visit: Payer: 59 | Admitting: Family Medicine

## 2023-12-16 ENCOUNTER — Other Ambulatory Visit: Payer: Self-pay | Admitting: Family Medicine

## 2023-12-16 DIAGNOSIS — E1169 Type 2 diabetes mellitus with other specified complication: Secondary | ICD-10-CM

## 2023-12-16 MED ORDER — RYBELSUS 14 MG PO TABS: 1.0000 | ORAL_TABLET | Freq: Every day | ORAL | 3 refills | Status: AC

## 2023-12-17 ENCOUNTER — Encounter: Payer: Self-pay | Admitting: Family Medicine

## 2023-12-17 ENCOUNTER — Ambulatory Visit: Payer: 59 | Admitting: Family Medicine

## 2023-12-17 ENCOUNTER — Other Ambulatory Visit (HOSPITAL_BASED_OUTPATIENT_CLINIC_OR_DEPARTMENT_OTHER): Payer: Self-pay

## 2023-12-17 ENCOUNTER — Other Ambulatory Visit: Payer: Self-pay

## 2023-12-17 VITALS — BP 118/82 | Temp 97.0°F | Ht 67.0 in | Wt 226.0 lb

## 2023-12-17 DIAGNOSIS — J40 Bronchitis, not specified as acute or chronic: Secondary | ICD-10-CM

## 2023-12-17 DIAGNOSIS — E1169 Type 2 diabetes mellitus with other specified complication: Secondary | ICD-10-CM | POA: Diagnosis not present

## 2023-12-17 DIAGNOSIS — E782 Mixed hyperlipidemia: Secondary | ICD-10-CM

## 2023-12-17 DIAGNOSIS — I10 Essential (primary) hypertension: Secondary | ICD-10-CM | POA: Diagnosis not present

## 2023-12-17 DIAGNOSIS — Z7984 Long term (current) use of oral hypoglycemic drugs: Secondary | ICD-10-CM

## 2023-12-17 LAB — BAYER DCA HB A1C WAIVED: HB A1C (BAYER DCA - WAIVED): 7.6 % — ABNORMAL HIGH (ref 4.8–5.6)

## 2023-12-17 LAB — LIPID PANEL

## 2023-12-17 MED ORDER — ALBUTEROL SULFATE HFA 108 (90 BASE) MCG/ACT IN AERS
2.0000 | INHALATION_SPRAY | Freq: Four times a day (QID) | RESPIRATORY_TRACT | 0 refills | Status: DC | PRN
  Filled 2023-12-17: qty 8.5, 25d supply, fill #0

## 2023-12-17 MED ORDER — CEFTRIAXONE SODIUM 1 G IJ SOLR
1.0000 g | Freq: Once | INTRAMUSCULAR | Status: AC
  Administered 2023-12-17: 1 g via INTRAMUSCULAR

## 2023-12-17 MED ORDER — DOXYCYCLINE HYCLATE 100 MG PO TABS
100.0000 mg | ORAL_TABLET | Freq: Two times a day (BID) | ORAL | 0 refills | Status: DC
  Filled 2023-12-17: qty 20, 10d supply, fill #0

## 2023-12-17 MED ORDER — METHYLPREDNISOLONE ACETATE 80 MG/ML IJ SUSP
80.0000 mg | Freq: Once | INTRAMUSCULAR | Status: AC
  Administered 2023-12-17: 80 mg via INTRAMUSCULAR

## 2023-12-17 MED ORDER — DOXYCYCLINE HYCLATE 100 MG PO TABS: 100.0000 mg | ORAL_TABLET | Freq: Two times a day (BID) | ORAL | 0 refills | Status: DC

## 2023-12-17 MED ORDER — ALBUTEROL SULFATE HFA 108 (90 BASE) MCG/ACT IN AERS: 2.0000 | INHALATION_SPRAY | Freq: Four times a day (QID) | RESPIRATORY_TRACT | 0 refills | Status: AC | PRN

## 2023-12-17 NOTE — Progress Notes (Signed)
 BP 118/82   Temp (!) 97 F (36.1 C)   Ht 5\' 7"  (1.702 m)   Wt 226 lb (102.5 kg)   SpO2 95%   BMI 35.40 kg/m    Subjective:   Patient ID: Jimmy Lysle Rubens., male    DOB: November 24, 1965, 58 y.o.   MRN: [id number]  HPI: Jimmy Edna Grover. is a 58 y.o. male presenting on 12/17/2023 for Medical Management of Chronic Issues, Diabetes, chest congestion, and Nausea   HPI Chest congestion and difficulty breathing Patient has been having increased chest congestion and difficulty breathing.  He was treated for sinusitis and took the Augmentin and finished 3 to 4 days ago and feels like his sinus pressure is a lot better but now he feels like he is getting down in his chest and he has been having a lot of coughing and some wheezing and rattling at nighttime especially.  He does take a shower in the morning and that does help.  He denies any fevers or chills but just not feeling well and he started feeling nauseated as well a little bit with it.  He still been taking the Mucinex and just feels like is not helping anymore.  Type 2 diabetes mellitus Patient comes in today for recheck of his diabetes. Patient has been currently taking Synjardy and Rybelsus. Patient is currently on an ACE inhibitor/ARB. Patient has seen an ophthalmologist this year. Patient denies any new issues with their feet. The symptom started onset as an adult hypertension and hyperlipidemia ARE RELATED TO DM   Hypertension Patient is currently on amlodipine and losartan and metoprolol, and their blood pressure today is 118/82. Patient denies any lightheadedness or dizziness. Patient denies headaches, blurred vision, chest pains, shortness of breath, or weakness. Denies any side effects from medication and is content with current medication.   Hyperlipidemia Patient is coming in for recheck of his hyperlipidemia. The patient is currently taking fenofibrate and Vascepa and Crestor. They deny any issues with myalgias or history of  liver damage from it. They deny any focal numbness or weakness or chest pain.   Relevant past medical, surgical, family and social history reviewed and updated as indicated. Interim medical history since our last visit reviewed. Allergies and medications reviewed and updated.  Review of Systems  Constitutional:  Negative for chills and fever.  HENT:  Positive for congestion. Negative for ear discharge, ear pain, postnasal drip, rhinorrhea, sinus pressure, sneezing, sore throat and voice change.   Eyes:  Negative for pain, discharge, redness and visual disturbance.  Respiratory:  Positive for cough, shortness of breath and wheezing.   Cardiovascular:  Negative for chest pain and leg swelling.  Musculoskeletal:  Negative for back pain and gait problem.  Skin:  Negative for rash.  Neurological:  Negative for dizziness, weakness and light-headedness.  All other systems reviewed and are negative.   Per HPI unless specifically indicated above   Allergies as of 12/17/2023       Reactions   Trulicity [dulaglutide] Nausea Only        Medication List        Accurate as of December 17, 2023 10:59 AM. If you have any questions, ask your nurse or doctor.          STOP taking these medications    amoxicillin-clavulanate 875-125 MG tablet Commonly known as: AUGMENTIN Stopped by: Elige Radon Annalycia Done       TAKE these medications    albuterol 108 (90 Base) MCG/ACT  inhaler Commonly known as: VENTOLIN HFA Inhale 2 puffs into the lungs every 6 (six) hours as needed for wheezing or shortness of breath. Started by: Elige Radon Iktan Aikman   amLODipine 5 MG tablet Commonly known as: NORVASC Take 1 tablet (5 mg total) by mouth daily.   aspirin EC 81 MG tablet Take 81 mg by mouth daily.   busPIRone 15 MG tablet Commonly known as: BUSPAR Take 1 tablet (15 mg total) by mouth 2 (two) times daily as needed.   CALCIUM-MAGNESIUM-ZINC-D3 PO Take 1 tablet by mouth daily at 6 (six) AM.    colchicine 0.6 MG tablet TAKE 1.2 MG (2 TABLETS) BY MOUTH ONCE, THEN TAKE 0.6 MG ONE HOUR LATER. USE AS NEEDED FOR GOUT FLARE. DO NOT REPEAT FOR AT LEAST 3 DAYS.   doxycycline 100 MG tablet Commonly known as: VIBRA-TABS Take 1 tablet (100 mg total) by mouth 2 (two) times daily. Started by: Elige Radon Jailen Coward   fenofibrate 145 MG tablet Commonly known as: TRICOR Take 1 tablet (145 mg total) by mouth daily.   fluticasone 50 MCG/ACT nasal spray Commonly known as: FLONASE Place 2 sprays into both nostrils daily.   freestyle lancets Use to check blood sugar up to four times daily as directed.   FREESTYLE LITE test strip Generic drug: glucose blood Use up to four times daily as directed.   FreeStyle Lite w/Device Kit Use to check blood sugar up to four times daily as directed.   icosapent Ethyl 1 g capsule Commonly known as: VASCEPA Take 2 capsules (2 g total) by mouth 2 (two) times daily.   losartan 50 MG tablet Commonly known as: COZAAR Take 1 tablet (50 mg total) by mouth daily.   metoprolol succinate 25 MG 24 hr tablet Commonly known as: TOPROL-XL Take 1 tablet (25 mg total) by mouth daily.   nitroGLYCERIN 0.4 MG SL tablet Commonly known as: NITROSTAT Place 1 tablet (0.4 mg total) under the tongue every 5 (five) minutes as needed for chest pain.   pantoprazole 40 MG tablet Commonly known as: PROTONIX Take 1 tablet (40 mg total) by mouth daily.   rosuvastatin 40 MG tablet Commonly known as: CRESTOR Take 1 tablet (40 mg total) by mouth at bedtime.   Rybelsus 14 MG Tabs Generic drug: Semaglutide Take 1 tablet (14 mg total) by mouth daily.   Synjardy 12.5-500 MG Tabs Generic drug: Empagliflozin-metFORMIN HCl Take 1 tablet by mouth in the morning and at bedtime.   tiZANidine 2 MG tablet Commonly known as: ZANAFLEX Take 1 tablet (2 mg total) by mouth every 6 (six) hours as needed for muscle spasms.   triamcinolone cream 0.1 % Commonly known as: KENALOG Apply  to affected area(s) of skin twice daily for 14 days. (apply to skin bid 14 days)   Vitamin D 125 MCG (5000 UT) Caps Take 10,000 Units by mouth daily.   ZYRTEC ALLERGY PO Take 1 tablet by mouth daily at 6 (six) AM.         Objective:   BP 118/82   Temp (!) 97 F (36.1 C)   Ht 5\' 7"  (1.702 m)   Wt 226 lb (102.5 kg)   SpO2 95%   BMI 35.40 kg/m   Wt Readings from Last 3 Encounters:  12/17/23 226 lb (102.5 kg)  09/02/23 229 lb (103.9 kg)  07/10/23 226 lb (102.5 kg)    Physical Exam Vitals and nursing note reviewed.  Constitutional:      General: He is not in acute  distress.    Appearance: He is well-developed. He is not diaphoretic.  Eyes:     General: No scleral icterus.    Conjunctiva/sclera: Conjunctivae normal.  Neck:     Thyroid: No thyromegaly.  Cardiovascular:     Rate and Rhythm: Normal rate and regular rhythm.     Heart sounds: Normal heart sounds. No murmur heard. Pulmonary:     Effort: Pulmonary effort is normal. No respiratory distress.     Breath sounds: Rhonchi present. No wheezing or rales.  Musculoskeletal:        General: Normal range of motion.     Cervical back: Neck supple.  Lymphadenopathy:     Cervical: No cervical adenopathy.  Skin:    General: Skin is warm and dry.     Findings: No rash.  Neurological:     Mental Status: He is alert and oriented to person, place, and time.     Coordination: Coordination normal.  Psychiatric:        Behavior: Behavior normal.       Assessment & Plan:   Problem List Items Addressed This Visit       Cardiovascular and Mediastinum   HTN (hypertension)     Endocrine   Type 2 diabetes mellitus with other specified complication (HCC) - Primary   Relevant Orders   CBC with Differential/Platelet   CMP14+EGFR   Bayer DCA Hb A1c Waived     Other   Hyperlipidemia   Relevant Orders   CMP14+EGFR   Lipid panel   Other Visit Diagnoses       Bronchitis       Relevant Medications   cefTRIAXone  (ROCEPHIN) injection 1 g (Start on 12/17/2023 11:00 AM)   methylPREDNISolone acetate (DEPO-MEDROL) injection 80 mg (Start on 12/17/2023 11:00 AM)   albuterol (VENTOLIN HFA) 108 (90 Base) MCG/ACT inhaler   doxycycline (VIBRA-TABS) 100 MG tablet     Will give patient Rocephin 1 g intramuscular and Depo-Medrol 80 mg intramuscular and send him albuterol and doxycycline.  Likely bronchitis, trying to turn into pneumonia  Recommend he take probiotic as well to help with the stomach issues.  Will check blood work today for his routine diabetes exam  Follow up plan: Return in about 3 months (around 03/15/2024), or if symptoms worsen or fail to improve, for Diabetes recheck.  Counseling provided for all of the vaccine components Orders Placed This Encounter  Procedures   CBC with Differential/Platelet   CMP14+EGFR   Lipid panel   Bayer DCA Hb A1c Waived    Arville Care, MD [place] [facility] 12/17/2023, 10:59 AM

## 2023-12-18 ENCOUNTER — Ambulatory Visit: Payer: 59 | Admitting: Family Medicine

## 2023-12-18 LAB — CMP14+EGFR
ALT: 24 IU/L (ref 0–44)
AST: 22 IU/L (ref 0–40)
Albumin: 5 g/dL — ABNORMAL HIGH (ref 3.8–4.9)
Alkaline Phosphatase: 57 IU/L (ref 44–121)
BUN/Creatinine Ratio: 17 (ref 9–20)
BUN: 17 mg/dL (ref 6–24)
Bilirubin Total: 0.6 mg/dL (ref 0.0–1.2)
CO2: 21 mmol/L (ref 20–29)
Calcium: 10.1 mg/dL (ref 8.7–10.2)
Chloride: 101 mmol/L (ref 96–106)
Creatinine, Ser: 1.01 mg/dL (ref 0.76–1.27)
Globulin, Total: 2.4 g/dL (ref 1.5–4.5)
Glucose: 106 mg/dL — ABNORMAL HIGH (ref 70–99)
Potassium: 4.5 mmol/L (ref 3.5–5.2)
Sodium: 140 mmol/L (ref 134–144)
Total Protein: 7.4 g/dL (ref 6.0–8.5)
eGFR: 87 mL/min/{1.73_m2} (ref >59)

## 2023-12-18 LAB — CBC WITH DIFFERENTIAL/PLATELET
Basophils Absolute: 0 10*3/uL (ref 0.0–0.2)
Basos: 0 %
EOS (ABSOLUTE): 0.2 10*3/uL (ref 0.0–0.4)
Eos: 2 %
Hematocrit: 44.6 % (ref 37.5–51.0)
Hemoglobin: 15.8 g/dL (ref 13.0–17.7)
Immature Grans (Abs): 0 10*3/uL (ref 0.0–0.1)
Immature Granulocytes: 0 %
Lymphocytes Absolute: 2 10*3/uL (ref 0.7–3.1)
Lymphs: 21 %
MCH: 33.2 pg — ABNORMAL HIGH (ref 26.6–33.0)
MCHC: 35.4 g/dL (ref 31.5–35.7)
MCV: 94 fL (ref 79–97)
Monocytes Absolute: 0.8 10*3/uL (ref 0.1–0.9)
Monocytes: 9 %
Neutrophils Absolute: 6.5 10*3/uL (ref 1.4–7.0)
Neutrophils: 68 %
Platelets: 303 10*3/uL (ref 150–450)
RBC: 4.76 x10E6/uL (ref 4.14–5.80)
RDW: 12.4 % (ref 11.6–15.4)
WBC: 9.6 10*3/uL (ref 3.4–10.8)

## 2023-12-18 LAB — LIPID PANEL
Cholesterol, Total: 135 mg/dL (ref 100–199)
HDL: 41 mg/dL (ref >39)
LDL CALC COMMENT:: 3.3 ratio (ref 0.0–5.0)
LDL Chol Calc (NIH): 55 mg/dL (ref 0–99)
Triglycerides: 242 mg/dL — ABNORMAL HIGH (ref 0–149)
VLDL Cholesterol Cal: 39 mg/dL (ref 5–40)

## 2023-12-25 ENCOUNTER — Encounter: Payer: Self-pay | Admitting: Family Medicine

## 2023-12-25 ENCOUNTER — Other Ambulatory Visit: Payer: Self-pay

## 2023-12-25 ENCOUNTER — Other Ambulatory Visit (HOSPITAL_BASED_OUTPATIENT_CLINIC_OR_DEPARTMENT_OTHER): Payer: Self-pay

## 2023-12-25 DIAGNOSIS — E1169 Type 2 diabetes mellitus with other specified complication: Secondary | ICD-10-CM

## 2023-12-25 MED ORDER — SYNJARDY 12.5-500 MG PO TABS: 1.0000 | ORAL_TABLET | Freq: Two times a day (BID) | ORAL | 0 refills | Status: DC

## 2024-01-03 ENCOUNTER — Other Ambulatory Visit: Payer: Self-pay | Admitting: Family Medicine

## 2024-01-03 DIAGNOSIS — J40 Bronchitis, not specified as acute or chronic: Secondary | ICD-10-CM

## 2024-01-21 ENCOUNTER — Telehealth: Payer: Self-pay | Admitting: Pharmacist

## 2024-01-21 DIAGNOSIS — E1169 Type 2 diabetes mellitus with other specified complication: Secondary | ICD-10-CM

## 2024-01-21 MED ORDER — SYNJARDY 12.5-500 MG PO TABS: 1.0000 | ORAL_TABLET | Freq: Two times a day (BID) | ORAL | 5 refills | Status: AC

## 2024-01-21 NOTE — Telephone Encounter (Signed)
 Synjardy approved 11/2023 90 days sent to [facility] mail order Patient was out of refills Tolerating well

## 2024-02-22 ENCOUNTER — Other Ambulatory Visit: Payer: Self-pay

## 2024-02-22 DIAGNOSIS — K219 Gastro-esophageal reflux disease without esophagitis: Secondary | ICD-10-CM

## 2024-02-22 MED ORDER — PANTOPRAZOLE SODIUM 40 MG PO TBEC: 40.0000 mg | DELAYED_RELEASE_TABLET | Freq: Every day | ORAL | 3 refills | Status: AC

## 2024-02-22 MED ORDER — BUSPIRONE HCL 15 MG PO TABS: 15.0000 mg | ORAL_TABLET | Freq: Two times a day (BID) | ORAL | 1 refills | Status: DC | PRN

## 2024-03-11 ENCOUNTER — Telehealth: Admitting: Family

## 2024-03-11 ENCOUNTER — Encounter: Payer: Self-pay | Admitting: Family

## 2024-03-11 ENCOUNTER — Telehealth: Payer: Self-pay

## 2024-03-11 DIAGNOSIS — W57XXXA Bitten or stung by nonvenomous insect and other nonvenomous arthropods, initial encounter: Secondary | ICD-10-CM | POA: Diagnosis not present

## 2024-03-11 DIAGNOSIS — S30863A Insect bite (nonvenomous) of scrotum and testes, initial encounter: Secondary | ICD-10-CM | POA: Diagnosis not present

## 2024-03-11 MED ORDER — DOXYCYCLINE HYCLATE 100 MG PO TABS: 200.0000 mg | ORAL_TABLET | Freq: Once | ORAL | 1 refills | Status: AC

## 2024-03-11 NOTE — Progress Notes (Signed)
 Virtual Visit Consent   Jimmy Malvina Perez., you are scheduled for a virtual visit with a [facility] provider today. Just as with appointments in the office, your consent must be obtained to participate. Your consent will be active for this visit and any virtual visit you may have with one of our providers in the next 365 days. If you have a MyChart account, a copy of this consent can be sent to you electronically.  As this is a virtual visit, video technology does not allow for your provider to perform a traditional examination. This may limit your provider's ability to fully assess your condition. If your provider identifies any concerns that need to be evaluated in person or the need to arrange testing (such as labs, EKG, etc.), we will make arrangements to do so. Although advances in technology are sophisticated, we cannot ensure that it will always work on either your end or our end. If the connection with a video visit is poor, the visit may have to be switched to a telephone visit. With either a video or telephone visit, we are not always able to ensure that we have a secure connection.  By engaging in this virtual visit, you consent to the provision of healthcare and authorize for your insurance to be billed (if applicable) for the services provided during this visit. Depending on your insurance coverage, you may receive a charge related to this service.  I need to obtain your verbal consent now. Are you willing to proceed with your visit today? Jimmy Perez. has provided verbal consent on 03/11/2024 for a virtual visit (video or telephone). Tommas Fragmin, FNP  Date: 03/11/2024 8:42 AM   Virtual Visit via Video Note   I, Tommas Fragmin, connected with  Jimmy Perez.  ([id number], 1965-12-14) on 03/11/24 at  8:20 AM EDT by a video-enabled telemedicine application and verified that I am speaking with the correct person using two identifiers.  Location: Patient: Virtual Visit  Location Patient: Other: work Provider: Pharmacist, community: Home Office   I discussed the limitations of evaluation and management by telemedicine and the availability of in person appointments. The patient expressed understanding and agreed to proceed.    History of Present Illness: Jimmy Perez. is a 58 y.o. who identifies as a male who was assigned male at birth, and is being seen today for tick bite on left testicle. Reports he noticed it in the middle of the night. Denies any rash, fever, and myalgia.   Reports fatigue.   HPI: HPI  Problems:  Patient Active Problem List   Diagnosis Date Noted   Vitamin D  deficiency 10/11/2020   Type 2 diabetes mellitus with other specified complication (HCC) 12/14/2017   Obstructive sleep apnea 11/06/2014   Metabolic syndrome 04/14/2014   HTN (hypertension) 04/18/2013   Hyperlipidemia 04/18/2013   GERD (gastroesophageal reflux disease) 04/18/2013   Morbid obesity (HCC) 04/18/2013    Allergies:  Allergies  Allergen Reactions   Trulicity  [Dulaglutide ] Nausea Only   Medications:  Current Outpatient Medications:    doxycycline  (VIBRA -TABS) 100 MG tablet, Take 2 tablets (200 mg total) by mouth once for 1 dose., Disp: 2 tablet, Rfl: 1   albuterol  (VENTOLIN  HFA) 108 (90 Base) MCG/ACT inhaler, Inhale 2 puffs into the lungs every 6 (six) hours as needed for wheezing or shortness of breath., Disp: 8.5 g, Rfl: 0   amLODipine  (NORVASC ) 5 MG tablet, Take 1 tablet (5 mg total) by mouth daily.,  Disp: 90 tablet, Rfl: 3   aspirin  EC 81 MG tablet, Take 81 mg by mouth daily., Disp: , Rfl:    Blood Glucose Monitoring Suppl (FREESTYLE LITE) w/Device KIT, Use to check blood sugar up to four times daily as directed., Disp: 1 kit, Rfl: 0   busPIRone  (BUSPAR ) 15 MG tablet, Take 1 tablet (15 mg total) by mouth 2 (two) times daily as needed., Disp: 180 tablet, Rfl: 1   Cetirizine HCl (ZYRTEC ALLERGY PO), Take 1 tablet by mouth daily at 6 (six) AM.,  Disp: , Rfl:    Cholecalciferol (VITAMIN D ) 125 MCG (5000 UT) CAPS, Take 10,000 Units by mouth daily., Disp: , Rfl:    colchicine  0.6 MG tablet, TAKE 1.2 MG (2 TABLETS) BY MOUTH ONCE, THEN TAKE 0.6 MG ONE HOUR LATER. USE AS NEEDED FOR GOUT FLARE. DO NOT REPEAT FOR AT LEAST 3 DAYS., Disp: 9 tablet, Rfl: 0   Empagliflozin -metFORMIN  HCl (SYNJARDY ) 12.5-500 MG TABS, Take 1 tablet by mouth in the morning and at bedtime., Disp: 180 tablet, Rfl: 5   fenofibrate  (TRICOR ) 145 MG tablet, Take 1 tablet (145 mg total) by mouth daily., Disp: 90 tablet, Rfl: 1   fluticasone  (FLONASE ) 50 MCG/ACT nasal spray, Place 2 sprays into both nostrils daily., Disp: 16 g, Rfl: 10   glucose blood test strip, Use up to four times daily as directed., Disp: 100 each, Rfl: 0   icosapent  Ethyl (VASCEPA ) 1 g capsule, Take 2 capsules (2 g total) by mouth 2 (two) times daily., Disp: 360 capsule, Rfl: 3   Lancets (FREESTYLE) lancets, Use to check blood sugar up to four times daily as directed., Disp: 100 each, Rfl: 0   losartan  (COZAAR ) 50 MG tablet, Take 1 tablet (50 mg total) by mouth daily., Disp: 90 tablet, Rfl: 3   metoprolol  succinate (TOPROL -XL) 25 MG 24 hr tablet, Take 1 tablet (25 mg total) by mouth daily., Disp: 90 tablet, Rfl: 3   Multiple Minerals-Vitamins (CALCIUM -MAGNESIUM -ZINC-D3 PO), Take 1 tablet by mouth daily at 6 (six) AM., Disp: , Rfl:    nitroGLYCERIN  (NITROSTAT ) 0.4 MG SL tablet, Place 1 tablet (0.4 mg total) under the tongue every 5 (five) minutes as needed for chest pain., Disp: 25 tablet, Rfl: 1   pantoprazole  (PROTONIX ) 40 MG tablet, Take 1 tablet (40 mg total) by mouth daily., Disp: 90 tablet, Rfl: 3   rosuvastatin  (CRESTOR ) 40 MG tablet, Take 1 tablet (40 mg total) by mouth at bedtime., Disp: 90 tablet, Rfl: 1   Semaglutide  (RYBELSUS ) 14 MG TABS, Take 1 tablet (14 mg total) by mouth daily., Disp: 90 tablet, Rfl: 3   tiZANidine  (ZANAFLEX ) 2 MG tablet, Take 1 tablet (2 mg total) by mouth every 6 (six) hours  as needed for muscle spasms., Disp: 60 tablet, Rfl: 1   triamcinolone  cream (KENALOG ) 0.1 %, apply to skin bid 14 days, Disp: 60 g, Rfl: 0  Observations/Objective: Patient is well-developed, well-nourished in no acute distress.  Resting comfortably   Head is normocephalic, atraumatic.  No labored breathing.  Speech is clear and coherent with logical content.  Patient is alert and oriented at baseline.    Assessment and Plan: 1. Tick bite of scrotum, initial encounter (Primary) - doxycycline  (VIBRA -TABS) 100 MG tablet; Take 2 tablets (200 mg total) by mouth once for 1 dose.  Dispense: 2 tablet; Refill: 1  -Pt to report any new fever, joint pain, or rash -Wear protective clothing while outside- Long sleeves and long pants -Put insect repellent on all  exposed skin and along clothing -Take a shower as soon as possible after being outside -Follow up if symptoms worsen or do not improve   Follow Up Instructions: I discussed the assessment and treatment plan with the patient. The patient was provided an opportunity to ask questions and all were answered. The patient agreed with the plan and demonstrated an understanding of the instructions.  A copy of instructions were sent to the patient via MyChart unless otherwise noted below.     The patient was advised to call back or seek an in-person evaluation if the symptoms worsen or if the condition fails to improve as anticipated.    Tommas Fragmin, FNP

## 2024-03-11 NOTE — Telephone Encounter (Signed)
 Pt had recent tick bite and is requesting Doxy to be called in. Appt made with DOD today. Pt aware.

## 2024-03-30 NOTE — Progress Notes (Signed)
 HPI male nonsmoker followed for OSA, complicated by HBP, GERD, obesity, CAD/ stent Unattended home sleep study 01/05/15 AHI 77/ hr, desat to 73%, weight 253 lbs   -----------------------------------------------------------------------------------------.   03/24/23- 58 year old male never smoker followed for OSA, complicated by HBP, GERD, obesity, CAD/ stent, Hyperlipidemia, DM2, GERD, Morbid Obesity,  CPAP auto 10-20/ [facility]                  AirSense 10 AutoSet     Download- compliance  83%, AHI 0.6/ hr Body weight today-234 lbs -----Pt is doing well. No issues with cpap machine  Adjusting to replacement CPAP machine.  Download reviewed.  Comfort issues discussed.  Changed DME to [facility]. He denies new problems.  03/31/24- 58 year old male never smoker followed for OSA, complicated by HBP, GERD, obesity, CAD/ stent, Hyperlipidemia, DM2, GERD, Morbid Obesity,  CPAP auto 10-20/ [facility]                  AirSense 10 AutoSet     Download- compliance  87%, AHI 0.6/hr Body weight today-231 lbs Download reviewed. Uses old machine for travel.  Very satisfied- sleeps better with CPAP. History of Present Illness  ROS-see HPI   + = positive Constitutional:   No-   weight loss, night sweats, fevers, chills, +fatigue, lassitude. HEENT:   No-  headaches, difficulty swallowing, tooth/dental problems, sore throat,       No-  sneezing, itching, ear ache, nasal congestion, post nasal drip,  CV:  No-   chest pain, orthopnea, PND, swelling in lower extremities, anasarca,                                                    dizziness, palpitations Resp: No-   shortness of breath with exertion or at rest.              No-   productive cough,  No non-productive cough,  No- coughing up of blood.              No-   change in color of mucus.  No- wheezing.   Skin: No-   rash or lesions. GI:  +heartburn, indigestion, No-abdominal pain, nausea, vomiting,  GU:  MS:  No-   joint pain or swelling.   Neuro-      nothing unusual Psych:  No- change in mood or affect. No depression or anxiety.  No memory loss.  OBJ- Physical Exam     +obese General- Alert, Oriented, Affect-appropriate, Distress- none acute,  Skin- rash-none, lesions- none, excoriation- none Lymphadenopathy- none Head- atraumatic            Eyes- Gross vision intact, PERRLA, conjunctivae and secretions clear            Ears- +Hearing aid            Nose- Clear, no-Septal dev, mucus, polyps, erosion, perforation             Throat- Mallampati IV , mucosa clear , drainage- none, tonsils- atrophic, + hoarse Neck- flexible , trachea midline, no stridor , thyroid  nl, carotid no bruit Chest - symmetrical excursion , unlabored           Heart/CV- RRR , no murmur , no gallop  , no rub, nl s1 s2                           -  JVD- none , edema- none, stasis changes- none, varices- none           Lung- clear to P&A, wheeze- none, cough- none , dullness-none, rub- none           Chest wall-  Abd-  Br/ Gen/ Rectal- Not done, not indicated Extrem- cyanosis- none, clubbing, none, atrophy- none, strength- nl Neuro- grossly intact to observation

## 2024-03-31 ENCOUNTER — Encounter: Payer: Self-pay | Admitting: Internal Medicine

## 2024-03-31 ENCOUNTER — Ambulatory Visit: Payer: 59 | Admitting: Internal Medicine

## 2024-03-31 VITALS — BP 136/88 | HR 83 | Ht 67.0 in | Wt 231.8 lb

## 2024-03-31 DIAGNOSIS — F17210 Nicotine dependence, cigarettes, uncomplicated: Secondary | ICD-10-CM | POA: Diagnosis not present

## 2024-03-31 DIAGNOSIS — I10 Essential (primary) hypertension: Secondary | ICD-10-CM | POA: Diagnosis not present

## 2024-03-31 DIAGNOSIS — G4733 Obstructive sleep apnea (adult) (pediatric): Secondary | ICD-10-CM

## 2024-03-31 NOTE — Patient Instructions (Signed)
 Glad you are doing well. We can continue auto 10-20.  Please call if we can help

## 2024-04-05 ENCOUNTER — Other Ambulatory Visit: Payer: Self-pay

## 2024-04-05 DIAGNOSIS — E559 Vitamin D deficiency, unspecified: Secondary | ICD-10-CM

## 2024-04-05 DIAGNOSIS — I10 Essential (primary) hypertension: Secondary | ICD-10-CM

## 2024-04-05 DIAGNOSIS — E1169 Type 2 diabetes mellitus with other specified complication: Secondary | ICD-10-CM

## 2024-04-05 DIAGNOSIS — E782 Mixed hyperlipidemia: Secondary | ICD-10-CM

## 2024-04-06 ENCOUNTER — Encounter: Payer: Self-pay | Admitting: Family Medicine

## 2024-04-06 ENCOUNTER — Other Ambulatory Visit (HOSPITAL_BASED_OUTPATIENT_CLINIC_OR_DEPARTMENT_OTHER): Payer: Self-pay

## 2024-04-06 ENCOUNTER — Ambulatory Visit: Admitting: Family Medicine

## 2024-04-06 VITALS — BP 138/84 | HR 62 | Temp 97.8°F | Ht 67.0 in | Wt 228.2 lb

## 2024-04-06 DIAGNOSIS — E1169 Type 2 diabetes mellitus with other specified complication: Secondary | ICD-10-CM

## 2024-04-06 DIAGNOSIS — E782 Mixed hyperlipidemia: Secondary | ICD-10-CM | POA: Diagnosis not present

## 2024-04-06 DIAGNOSIS — I10 Essential (primary) hypertension: Secondary | ICD-10-CM | POA: Diagnosis not present

## 2024-04-06 DIAGNOSIS — M722 Plantar fascial fibromatosis: Secondary | ICD-10-CM

## 2024-04-06 DIAGNOSIS — Z7984 Long term (current) use of oral hypoglycemic drugs: Secondary | ICD-10-CM

## 2024-04-06 DIAGNOSIS — Z6835 Body mass index (BMI) 35.0-35.9, adult: Secondary | ICD-10-CM

## 2024-04-06 DIAGNOSIS — E559 Vitamin D deficiency, unspecified: Secondary | ICD-10-CM

## 2024-04-06 DIAGNOSIS — S39012A Strain of muscle, fascia and tendon of lower back, initial encounter: Secondary | ICD-10-CM

## 2024-04-06 LAB — LIPID PANEL

## 2024-04-06 LAB — BAYER DCA HB A1C WAIVED: HB A1C (BAYER DCA - WAIVED): 6.7 % — ABNORMAL HIGH (ref 4.8–5.6)

## 2024-04-06 MED ORDER — TIZANIDINE HCL 2 MG PO TABS
2.0000 mg | ORAL_TABLET | Freq: Four times a day (QID) | ORAL | 1 refills | Status: AC | PRN
Start: 2024-04-06 — End: ?
  Filled 2024-04-06: qty 60, 15d supply, fill #0

## 2024-04-06 NOTE — Progress Notes (Signed)
 BP 138/84   Pulse 62   Temp 97.8 F (36.6 C) (Temporal)   Ht 5' 7 (1.702 m)   Wt 228 lb 3.2 oz (103.5 kg)   SpO2 95%   BMI 35.74 kg/m    Subjective:   Patient ID: Jimmy Perez., male    DOB: January 16, 1966, 58 y.o.   MRN: [id number]  HPI: Jimmy Perez. is a 58 y.o. male presenting on 04/06/2024 for Medical Management of Chronic Issues and Foot Pain (Feet pain comes and goes, stands on concrete during work )   HPI Type 2 diabetes mellitus Patient comes in today for recheck of his diabetes. Patient has been currently taking Synjardy  and Rybelsus . Patient is currently on an ACE inhibitor/ARB. Patient has not seen an ophthalmologist this year. Patient denies any new issues with their feet. The symptom started onset as an adult hypertension and hyperlipidemia ARE RELATED TO DM   Hypertension Patient is currently on amlodipine  and losartan  and metoprolol , and their blood pressure today is 138/84. Patient denies any lightheadedness or dizziness. Patient denies headaches, blurred vision, chest pains, shortness of breath, or weakness. Denies any side effects from medication and is content with current medication.   Hyperlipidemia Patient is coming in for recheck of his hyperlipidemia. The patient is currently taking fenofibrate  and Vascepa  and Crestor . They deny any issues with myalgias or history of liver damage from it. They deny any focal numbness or weakness or chest pain.   Patient today is complaint he comes in with today's been having some pain around his feet and sometimes bothers him and sometimes not.  He says it hurt more especially early morning when he gets up and it hurts when he steps on it.  He says has been having off and on for a month or 2.  Says is not there every day and he is try to have good foot wear at work.  Vitamin D  deficiency recheck Patient is coming in for vitamin D  deficiency recheck.  Patient's other complaint is that he has some lower back pain  issues.  He says it comes and goes but most of the time is when he wakes up in the morning.  Per his wife he does have some funny sleeping positions and that he does better throughout the day without too much pain.   Relevant past medical, surgical, family and social history reviewed and updated as indicated. Interim medical history since our last visit reviewed. Allergies and medications reviewed and updated.  Review of Systems  Constitutional:  Negative for chills and fever.  Eyes:  Negative for discharge.  Respiratory:  Negative for shortness of breath and wheezing.   Cardiovascular:  Negative for chest pain and leg swelling.  Musculoskeletal:  Positive for arthralgias. Negative for back pain and gait problem.  Skin:  Negative for rash.  All other systems reviewed and are negative.   Per HPI unless specifically indicated above   Allergies as of 04/06/2024       Reactions   Trulicity  [dulaglutide ] Nausea Only        Medication List        Accurate as of April 06, 2024  4:38 PM. If you have any questions, ask your nurse or doctor.          albuterol  108 (90 Base) MCG/ACT inhaler Commonly known as: VENTOLIN  HFA Inhale 2 puffs into the lungs every 6 (six) hours as needed for wheezing or shortness of breath.   amLODipine   5 MG tablet Commonly known as: NORVASC  Take 1 tablet (5 mg total) by mouth daily.   aspirin  EC 81 MG tablet Take 81 mg by mouth daily.   busPIRone  15 MG tablet Commonly known as: BUSPAR  Take 1 tablet (15 mg total) by mouth 2 (two) times daily as needed.   CALCIUM -MAGNESIUM -ZINC-D3 PO Take 1 tablet by mouth daily at 6 (six) AM.   colchicine  0.6 MG tablet TAKE 1.2 MG (2 TABLETS) BY MOUTH ONCE, THEN TAKE 0.6 MG ONE HOUR LATER. USE AS NEEDED FOR GOUT FLARE. DO NOT REPEAT FOR AT LEAST 3 DAYS.   fenofibrate  145 MG tablet Commonly known as: TRICOR  Take 1 tablet (145 mg total) by mouth daily.   fluticasone  50 MCG/ACT nasal spray Commonly known as:  FLONASE  Place 2 sprays into both nostrils daily.   freestyle lancets Use to check blood sugar up to four times daily as directed.   FREESTYLE LITE test strip Generic drug: glucose blood Use up to four times daily as directed.   FreeStyle Lite w/Device Kit Use to check blood sugar up to four times daily as directed.   icosapent  Ethyl 1 g capsule Commonly known as: VASCEPA  Take 2 capsules (2 g total) by mouth 2 (two) times daily.   losartan  50 MG tablet Commonly known as: COZAAR  Take 1 tablet (50 mg total) by mouth daily.   metoprolol  succinate 25 MG 24 hr tablet Commonly known as: TOPROL -XL Take 1 tablet (25 mg total) by mouth daily.   nitroGLYCERIN  0.4 MG SL tablet Commonly known as: NITROSTAT  Place 1 tablet (0.4 mg total) under the tongue every 5 (five) minutes as needed for chest pain.   pantoprazole  40 MG tablet Commonly known as: PROTONIX  Take 1 tablet (40 mg total) by mouth daily.   rosuvastatin  40 MG tablet Commonly known as: CRESTOR  Take 1 tablet (40 mg total) by mouth at bedtime.   Rybelsus  14 MG Tabs Generic drug: Semaglutide  Take 1 tablet (14 mg total) by mouth daily.   Synjardy  12.5-500 MG Tabs Generic drug: Empagliflozin -metFORMIN  HCl Take 1 tablet by mouth in the morning and at bedtime.   tiZANidine  2 MG tablet Commonly known as: ZANAFLEX  Take 1 tablet (2 mg total) by mouth every 6 (six) hours as needed for muscle spasms.   triamcinolone  cream 0.1 % Commonly known as: KENALOG  Apply to affected area(s) of skin twice daily for 14 days. (apply to skin bid 14 days)   Vitamin D  125 MCG (5000 UT) Caps Take 10,000 Units by mouth daily.   ZYRTEC ALLERGY PO Take 1 tablet by mouth daily at 6 (six) AM.         Objective:   BP 138/84   Pulse 62   Temp 97.8 F (36.6 C) (Temporal)   Ht 5' 7 (1.702 m)   Wt 228 lb 3.2 oz (103.5 kg)   SpO2 95%   BMI 35.74 kg/m   Wt Readings from Last 3 Encounters:  04/06/24 228 lb 3.2 oz (103.5 kg)  03/31/24  231 lb 12.8 oz (105.1 kg)  12/17/23 226 lb (102.5 kg)    Physical Exam  Results for orders placed or performed in visit on 04/06/24  Bayer DCA Hb A1c Waived   Collection Time: 04/06/24  8:05 AM  Result Value Ref Range   HB A1C (BAYER DCA - WAIVED) 6.7 (H) 4.8 - 5.6 %    Assessment & Plan:   Problem List Items Addressed This Visit       Cardiovascular and Mediastinum  HTN (hypertension) - Primary     Endocrine   Type 2 diabetes mellitus with other specified complication (HCC)     Other   Hyperlipidemia   Morbid obesity (HCC)   Vitamin D  deficiency   Other Visit Diagnoses       Plantar fasciitis of right foot         Strain of lumbar region, initial encounter       Relevant Medications   tiZANidine  (ZANAFLEX ) 2 MG tablet     For his plantar fasciitis recommended that he use good footwear even in the house and not walk barefoot on the hardwood floors that he also uses ice or frozen water bottle and roll it under his foot to help continue to do stretching. A1c 6.7.  Blood pressure and heart rate oxygen look good.  If lower back does not improve in the next couple weeks that let us  know and we can do physical therapy referral.  For now is going to use muscle relaxer at night and continue doing stretches. Follow up plan: Return in about 3 months (around 07/07/2024), or if symptoms worsen or fail to improve, for Diabetes.  Counseling provided for all of the vaccine components No orders of the defined types were placed in this encounter.   Jolyne Needs, MD [facility] 04/06/2024, 4:38 PM

## 2024-04-07 ENCOUNTER — Ambulatory Visit: Admitting: Family Medicine

## 2024-04-07 LAB — COMPREHENSIVE METABOLIC PANEL WITH GFR
ALT: 22 IU/L (ref 0–44)
AST: 16 IU/L (ref 0–40)
Albumin: 4.5 g/dL (ref 3.8–4.9)
Alkaline Phosphatase: 55 IU/L (ref 44–121)
BUN/Creatinine Ratio: 13 (ref 9–20)
BUN: 13 mg/dL (ref 6–24)
Bilirubin Total: 0.6 mg/dL (ref 0.0–1.2)
CO2: 18 mmol/L — ABNORMAL LOW (ref 20–29)
Calcium: 9.1 mg/dL (ref 8.7–10.2)
Chloride: 102 mmol/L (ref 96–106)
Creatinine, Ser: 1 mg/dL (ref 0.76–1.27)
Globulin, Total: 1.8 g/dL (ref 1.5–4.5)
Glucose: 122 mg/dL — ABNORMAL HIGH (ref 70–99)
Potassium: 4.3 mmol/L (ref 3.5–5.2)
Sodium: 142 mmol/L (ref 134–144)
Total Protein: 6.3 g/dL (ref 6.0–8.5)
eGFR: 88 mL/min/{1.73_m2} (ref >59)

## 2024-04-07 LAB — CBC WITH DIFFERENTIAL/PLATELET
Basophils Absolute: 0 10*3/uL (ref 0.0–0.2)
Basos: 1 %
EOS (ABSOLUTE): 0.2 10*3/uL (ref 0.0–0.4)
Eos: 4 %
Hematocrit: 44.6 % (ref 37.5–51.0)
Hemoglobin: 14.7 g/dL (ref 13.0–17.7)
Immature Grans (Abs): 0 10*3/uL (ref 0.0–0.1)
Immature Granulocytes: 0 %
Lymphocytes Absolute: 2 10*3/uL (ref 0.7–3.1)
Lymphs: 33 %
MCH: 32.2 pg (ref 26.6–33.0)
MCHC: 33 g/dL (ref 31.5–35.7)
MCV: 98 fL — ABNORMAL HIGH (ref 79–97)
Monocytes Absolute: 0.7 10*3/uL (ref 0.1–0.9)
Monocytes: 11 %
Neutrophils Absolute: 3.1 10*3/uL (ref 1.4–7.0)
Neutrophils: 51 %
Platelets: 263 10*3/uL (ref 150–450)
RBC: 4.57 x10E6/uL (ref 4.14–5.80)
RDW: 12.9 % (ref 11.6–15.4)
WBC: 6.1 10*3/uL (ref 3.4–10.8)

## 2024-04-07 LAB — LIPID PANEL
Cholesterol, Total: 102 mg/dL (ref 100–199)
HDL: 35 mg/dL — ABNORMAL LOW (ref >39)
LDL CALC COMMENT:: 2.9 ratio (ref 0.0–5.0)
LDL Chol Calc (NIH): 20 mg/dL (ref 0–99)
Triglycerides: 330 mg/dL — ABNORMAL HIGH (ref 0–149)
VLDL Cholesterol Cal: 47 mg/dL — ABNORMAL HIGH (ref 5–40)

## 2024-04-07 LAB — VITAMIN D 25 HYDROXY (VIT D DEFICIENCY, FRACTURES): Vit D, 25-Hydroxy: 38 ng/mL (ref 30.0–100.0)

## 2024-04-09 ENCOUNTER — Other Ambulatory Visit (HOSPITAL_BASED_OUTPATIENT_CLINIC_OR_DEPARTMENT_OTHER): Payer: Self-pay

## 2024-04-11 ENCOUNTER — Encounter: Payer: Self-pay | Admitting: Nurse Practitioner

## 2024-04-11 ENCOUNTER — Ambulatory Visit: Admitting: Nurse Practitioner

## 2024-04-11 ENCOUNTER — Ambulatory Visit (INDEPENDENT_AMBULATORY_CARE_PROVIDER_SITE_OTHER)

## 2024-04-11 ENCOUNTER — Ambulatory Visit: Payer: Self-pay | Admitting: Nurse Practitioner

## 2024-04-11 VITALS — BP 145/88 | HR 66 | Temp 97.2°F | Ht 67.0 in | Wt 228.6 lb

## 2024-04-11 DIAGNOSIS — W19XXXA Unspecified fall, initial encounter: Secondary | ICD-10-CM | POA: Diagnosis not present

## 2024-04-11 DIAGNOSIS — M25512 Pain in left shoulder: Secondary | ICD-10-CM | POA: Diagnosis not present

## 2024-04-11 MED ORDER — METHYLPREDNISOLONE 4 MG PO TBPK: ORAL_TABLET | ORAL | 0 refills | Status: DC

## 2024-04-11 NOTE — Progress Notes (Signed)
 Acute Office Visit  Subjective:     Patient ID: Jimmy Malvina Searle., male    DOB: December 26, 1965, 58 y.o.   MRN: [id number]  Chief Complaint  Patient presents with   Shoulder Injury    Jimmy Perez yesterday and landed on left shoulder, having pain and difficulty moving     HPI Jimmy Malvina Searle. Is a 58 yrs old male presents 04/11/2024 for shoulder pain Fell yesterday  slite on a black mat Shoulder Pain: Patient complaints of left shoulder pain. This is evaluated as a personal injury. The pain is described as aching.  The onset of the pain was sudden, related to a fall from standing. Mechanism of injury: fall.  The pain occurs when active and lasts 2 days.  Location is lateral. No history of dislocation. Symptoms are aggravated by reaching, lifting. Symptoms are diminished by  rest.   Limited activities include: lifting, pulling. mild stiffness is reported. 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    Active Ambulatory Problems    Diagnosis Date Noted   HTN (hypertension) 04/18/2013   Hyperlipidemia 04/18/2013   GERD (gastroesophageal reflux disease) 04/18/2013   Morbid obesity (HCC) 04/18/2013   Metabolic syndrome 04/14/2014   Obstructive sleep apnea 11/06/2014   Type 2 diabetes mellitus with other specified complication (HCC) 12/14/2017   Vitamin D  deficiency 10/11/2020   Resolved Ambulatory Problems    Diagnosis Date Noted   Low back pain 12/06/2014   Abnormal CT scan, stomach    Angina pectoris (HCC) 12/14/2017   Injury of right rotator cuff 09/21/2018   Acute pain of right shoulder 09/21/2018   Past Medical History:  Diagnosis Date   Allergy    Borderline diabetes    Chronic kidney disease    Diabetes mellitus without complication (HCC)    Gout    Hypertension    Kidney stones    OSA (obstructive sleep apnea)    Pre-diabetes    Sleep apnea     Review of Systems  Constitutional:  Negative for chills and fever.  Cardiovascular:  Negative for chest  pain and leg swelling.  Gastrointestinal:  Negative for nausea and vomiting.  Musculoskeletal:  Positive for falls and joint pain.       Left shoulder 3/10 non radiating pain  Neurological:  Negative for dizziness and headaches.   Negative unless indicated in HPI    Objective:    BP (!) 145/88   Pulse 66   Temp (!) 97.2 F (36.2 C) (Temporal)   Ht 5' 7 (1.702 m)   Wt 228 lb 9.6 oz (103.7 kg)   SpO2 96%   BMI 35.80 kg/m  BP Readings from Last 3 Encounters:  04/11/24 (!) 145/88  04/06/24 138/84  03/31/24 136/88   Wt Readings from Last 3 Encounters:  04/11/24 228 lb 9.6 oz (103.7 kg)  04/06/24 228 lb 3.2 oz (103.5 kg)  03/31/24 231 lb 12.8 oz (105.1 kg)      Physical Exam Vitals and nursing note reviewed.  Constitutional:      General: He is not in acute distress. HENT:     Head: Normocephalic and atraumatic.     Nose: Nose normal.     Mouth/Throat:     Mouth: Mucous membranes are moist.   Eyes:     General: No scleral icterus.    Extraocular Movements: Extraocular movements intact.     Conjunctiva/sclera: Conjunctivae normal.     Pupils: Pupils are equal, round,  and reactive to light.    Cardiovascular:     Heart sounds: Normal heart sounds.  Pulmonary:     Effort: Pulmonary effort is normal.     Breath sounds: Normal breath sounds.   Musculoskeletal:        General: Normal range of motion.     Right shoulder: Normal.     Left shoulder: Swelling and tenderness present. No crepitus. Normal pulse.     Right lower leg: No edema.     Left lower leg: No edema.   Skin:    General: Skin is warm and dry.     Findings: No rash.   Neurological:     Mental Status: He is alert and oriented to person, place, and time.   Psychiatric:        Mood and Affect: Mood normal.        Behavior: Behavior normal.        Thought Content: Thought content normal.        Judgment: Judgment normal.    X-ra: No acute fracture or dislocation. Mild joint space loss of the  AC joint. Osteophyte formation along the coracoid process. Soft tissues are unremarkable.  No results found for any visits on 04/11/24.      Assessment & Plan:  Acute pain of left shoulder -     DG Shoulder Left; Future -     methylPREDNISolone ; Follow instructions  Dispense: 21 tablet; Refill: 0  Fall, initial encounter -     DG Shoulder Left; Future -     methylPREDNISolone ; Follow instructions  Dispense: 21 tablet; Refill: 0  Jimmy Perez is a 58 year old Caucasian male seen today for left shoulder pain, no acute distress Continue Zanaflex  as previously prescribed -Medrol  Dosepak 4 mg #21 dispense following # in the box - Arm sling for 4-5 days - work note provided - x-ray negative for fx - heat/ice PRN every 15 mins as tolerated   The above assessment and management plan was discussed with the patient. The patient verbalized understanding of and has agreed to the management plan. Patient is aware to call the clinic if they develop any new symptoms or if symptoms persist or worsen. Patient is aware when to return to the clinic for a follow-up visit. Patient educated on when it is appropriate to go to the emergency department.  Return if symptoms worsen or fail to improve.  Jimmy Foots [place] Thompson, DNP [facility] [address] [place], [place] [address] [phone]  Note: This document was prepared by [employer] voice dictation technology and any errors that results from this process are unintentional.

## 2024-04-14 ENCOUNTER — Ambulatory Visit: Payer: Self-pay | Admitting: Family Medicine

## 2024-04-21 ENCOUNTER — Other Ambulatory Visit: Payer: Self-pay | Admitting: Family Medicine

## 2024-04-21 DIAGNOSIS — E782 Mixed hyperlipidemia: Secondary | ICD-10-CM

## 2024-05-05 ENCOUNTER — Encounter: Payer: Self-pay | Admitting: Internal Medicine

## 2024-05-05 NOTE — Assessment & Plan Note (Signed)
Benefits from CPAP with good compliance and control Plan- continue auto 10-20 

## 2024-05-05 NOTE — Assessment & Plan Note (Signed)
 136/88 here today. Discussed interaction between OSA and HTN.

## 2024-05-23 ENCOUNTER — Other Ambulatory Visit: Payer: Self-pay | Admitting: Family Medicine

## 2024-05-23 DIAGNOSIS — E782 Mixed hyperlipidemia: Secondary | ICD-10-CM

## 2024-07-15 ENCOUNTER — Ambulatory Visit: Admitting: Family Medicine

## 2024-07-20 ENCOUNTER — Other Ambulatory Visit: Payer: Self-pay

## 2024-07-20 ENCOUNTER — Other Ambulatory Visit: Payer: Self-pay | Admitting: *Deleted

## 2024-07-20 ENCOUNTER — Other Ambulatory Visit

## 2024-07-20 DIAGNOSIS — E1169 Type 2 diabetes mellitus with other specified complication: Secondary | ICD-10-CM

## 2024-07-20 DIAGNOSIS — E782 Mixed hyperlipidemia: Secondary | ICD-10-CM

## 2024-07-20 DIAGNOSIS — I10 Essential (primary) hypertension: Secondary | ICD-10-CM

## 2024-07-20 LAB — CBC WITH DIFFERENTIAL/PLATELET
Basophils Absolute: 0.1 x10E3/uL (ref 0.0–0.2)
Basos: 1 %
EOS (ABSOLUTE): 0.2 x10E3/uL (ref 0.0–0.4)
Eos: 2 %
Hematocrit: 46.4 % (ref 37.5–51.0)
Hemoglobin: 15.2 g/dL (ref 13.0–17.7)
Immature Grans (Abs): 0 x10E3/uL (ref 0.0–0.1)
Immature Granulocytes: 0 %
Lymphocytes Absolute: 2.2 x10E3/uL (ref 0.7–3.1)
Lymphs: 26 %
MCH: 32.3 pg (ref 26.6–33.0)
MCHC: 32.8 g/dL (ref 31.5–35.7)
MCV: 99 fL — ABNORMAL HIGH (ref 79–97)
Monocytes Absolute: 0.9 x10E3/uL (ref 0.1–0.9)
Monocytes: 11 %
Neutrophils Absolute: 5 x10E3/uL (ref 1.4–7.0)
Neutrophils: 60 %
Platelets: 293 x10E3/uL (ref 150–450)
RBC: 4.71 x10E6/uL (ref 4.14–5.80)
RDW: 12.4 % (ref 11.6–15.4)
WBC: 8.3 x10E3/uL (ref 3.4–10.8)

## 2024-07-20 LAB — CMP14+EGFR
ALT: 21 IU/L (ref 0–44)
AST: 14 IU/L (ref 0–40)
Albumin: 4.6 g/dL (ref 3.8–4.9)
Alkaline Phosphatase: 72 IU/L (ref 47–123)
BUN/Creatinine Ratio: 17 (ref 9–20)
BUN: 18 mg/dL (ref 6–24)
Bilirubin Total: 0.4 mg/dL (ref 0.0–1.2)
CO2: 23 mmol/L (ref 20–29)
Calcium: 10 mg/dL (ref 8.7–10.2)
Chloride: 101 mmol/L (ref 96–106)
Creatinine, Ser: 1.07 mg/dL (ref 0.76–1.27)
Globulin, Total: 2.3 g/dL (ref 1.5–4.5)
Glucose: 141 mg/dL — ABNORMAL HIGH (ref 70–99)
Potassium: 4.4 mmol/L (ref 3.5–5.2)
Sodium: 140 mmol/L (ref 134–144)
Total Protein: 6.9 g/dL (ref 6.0–8.5)
eGFR: 81 mL/min/1.73 (ref >59)

## 2024-07-20 LAB — LIPID PANEL
Chol/HDL Ratio: 3.5 ratio (ref 0.0–5.0)
Cholesterol, Total: 124 mg/dL (ref 100–199)
HDL: 35 mg/dL — ABNORMAL LOW (ref >39)
LDL Chol Calc (NIH): 43 mg/dL (ref 0–99)
Triglycerides: 303 mg/dL — ABNORMAL HIGH (ref 0–149)
VLDL Cholesterol Cal: 46 mg/dL — ABNORMAL HIGH (ref 5–40)

## 2024-07-20 LAB — BAYER DCA HB A1C WAIVED: HB A1C (BAYER DCA - WAIVED): 6.7 % — ABNORMAL HIGH (ref 4.8–5.6)

## 2024-07-21 ENCOUNTER — Other Ambulatory Visit (HOSPITAL_BASED_OUTPATIENT_CLINIC_OR_DEPARTMENT_OTHER): Payer: Self-pay

## 2024-07-21 ENCOUNTER — Other Ambulatory Visit: Payer: Self-pay

## 2024-07-21 ENCOUNTER — Encounter: Payer: Self-pay | Admitting: Family Medicine

## 2024-07-21 ENCOUNTER — Ambulatory Visit: Admitting: Family Medicine

## 2024-07-21 VITALS — BP 130/87 | HR 71 | Temp 98.1°F | Ht 67.0 in | Wt 225.0 lb

## 2024-07-21 DIAGNOSIS — E782 Mixed hyperlipidemia: Secondary | ICD-10-CM | POA: Diagnosis not present

## 2024-07-21 DIAGNOSIS — I10 Essential (primary) hypertension: Secondary | ICD-10-CM

## 2024-07-21 DIAGNOSIS — E1169 Type 2 diabetes mellitus with other specified complication: Secondary | ICD-10-CM | POA: Diagnosis not present

## 2024-07-21 DIAGNOSIS — Z7984 Long term (current) use of oral hypoglycemic drugs: Secondary | ICD-10-CM

## 2024-07-21 MED ORDER — GLUCOSE BLOOD VI STRP: ORAL_STRIP | 3 refills | Status: AC

## 2024-07-21 MED ORDER — BUSPIRONE HCL 15 MG PO TABS: 15.0000 mg | ORAL_TABLET | Freq: Two times a day (BID) | ORAL | 3 refills | Status: AC | PRN

## 2024-07-21 MED ORDER — BUSPIRONE HCL 15 MG PO TABS
15.0000 mg | ORAL_TABLET | Freq: Two times a day (BID) | ORAL | 3 refills | Status: DC | PRN
  Filled 2024-07-21: qty 180, 90d supply, fill #0

## 2024-07-21 MED ORDER — FREESTYLE LANCETS MISC
3 refills | Status: DC
  Filled 2024-07-21: qty 100, 25d supply, fill #0

## 2024-07-21 MED ORDER — FREESTYLE LANCETS MISC: 3 refills | Status: AC

## 2024-07-21 MED ORDER — GLUCOSE BLOOD VI STRP
ORAL_STRIP | 3 refills | Status: DC
Start: 2024-07-21 — End: 2024-07-21
  Filled 2024-07-21: qty 100, 25d supply, fill #0

## 2024-07-21 NOTE — Addendum Note (Signed)
 Addended by: LEIGH ROSINA SAILOR on: 07/21/2024 09:41 AM   Modules accepted: Orders

## 2024-07-21 NOTE — Progress Notes (Signed)
 BP 130/87   Pulse 71   Temp 98.1 F (36.7 C)   Ht 5' 7 (1.702 m)   Wt 225 lb (102.1 kg)   SpO2 96%   BMI 35.24 kg/m    Subjective:   Patient ID: Jimmy Francis Ina Raddle., male    DOB: [birth year], 58 y.o.   MRN: [id number]  HPI: Jimmy Jayshon Dommer. is a 58 y.o. male presenting on 07/21/2024 for Medical Management of Chronic Issues, Diabetes, and Hypertension   Discussed the use of AI scribe software for clinical note transcription with the patient, who gave verbal consent to proceed.  History of Present Illness   Jimmy Perez is a 58 year old male who presents for routine follow-up and management of shoulder pain.  He has been experiencing ongoing shoulder pain. The pain is localized to the shoulder and significantly affects his ability to perform full arm circles. 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  He has a history of diabetes with a current A1c of 6.7. He is working to improve this through dietary changes, although recent life events, including the passing of his father, have disrupted his routine. He is currently taking Synjardy  and Rybelsus  for diabetes management.  He is managing hyperlipidemia with Crestor  and Vascepa . He takes Vascepa , two pills in the morning and two in the evening, but has missed doses recently due to supply issues.  He reports sinus issues over the past few days, which have improved with over-the-counter sinus medication. He describes the sinus pain as feeling like being 'slapped with a shovel' but notes improvement with medication.  He is currently taking amlodipine , losartan , and metoprolol  for blood pressure management. No issues obtaining these medications.  He mentions a toenail issue, specifically an ingrown toenail, which was exacerbated by a poor pedicure experience. He also reports a recent incident of stubbing his toe, causing some sensitivity and redness.  He has been  engaging in more physical activities, such as biking with his grandchildren and playing disc golf, to improve his overall health.      Relevant past medical, surgical, family and social history reviewed and updated as indicated. Interim medical history since our last visit reviewed. Allergies and medications reviewed and updated.  Review of Systems  Constitutional:  Negative for chills and fever.  Eyes:  Negative for visual disturbance.  Respiratory:  Negative for shortness of breath and wheezing.   Cardiovascular:  Negative for chest pain and leg swelling.  Musculoskeletal:  Negative for back pain and gait problem.  Skin:  Negative for rash.  All other systems reviewed and are negative.   Per HPI unless specifically indicated above   Allergies as of 07/21/2024       Reactions   Trulicity  [dulaglutide ] Nausea Only        Medication List        Accurate as of July 21, 2024  9:13 AM. If you have any questions, ask your nurse or doctor.          STOP taking these medications    methylPREDNISolone  4 MG Tbpk tablet Commonly known as: MEDROL  DOSEPAK Stopped by: Fonda LABOR Ayauna Mcnay       TAKE these medications    albuterol  108 (90 Base) MCG/ACT inhaler Commonly known as: VENTOLIN  HFA Inhale 2 puffs into the lungs every 6 (six) hours as needed for wheezing or shortness of breath.   amLODipine  5 MG tablet Commonly  known as: NORVASC  Take 1 tablet (5 mg total) by mouth daily.   aspirin  EC 81 MG tablet Take 81 mg by mouth daily.   busPIRone  15 MG tablet Commonly known as: BUSPAR  Take 1 tablet (15 mg total) by mouth 2 (two) times daily as needed.   CALCIUM -MAGNESIUM -ZINC-D3 PO Take 1 tablet by mouth daily at 6 (six) AM.   colchicine  0.6 MG tablet TAKE 1.2 MG (2 TABLETS) BY MOUTH ONCE, THEN TAKE 0.6 MG ONE HOUR LATER. USE AS NEEDED FOR GOUT FLARE. DO NOT REPEAT FOR AT LEAST 3 DAYS.   fenofibrate  145 MG tablet Commonly known as: TRICOR  TAKE 1 TABLET DAILY    fluticasone  50 MCG/ACT nasal spray Commonly known as: FLONASE  Place 2 sprays into both nostrils daily.   freestyle lancets Use to check blood sugar up to four times daily as directed.   FreeStyle Lite w/Device Kit Use to check blood sugar up to four times daily as directed.   glucose blood test strip Use up to four times daily as directed.   icosapent  Ethyl 1 g capsule Commonly known as: VASCEPA  Take 2 capsules (2 g total) by mouth 2 (two) times daily.   losartan  50 MG tablet Commonly known as: COZAAR  Take 1 tablet (50 mg total) by mouth daily.   metoprolol  succinate 25 MG 24 hr tablet Commonly known as: TOPROL -XL Take 1 tablet (25 mg total) by mouth daily.   nitroGLYCERIN  0.4 MG SL tablet Commonly known as: NITROSTAT  Place 1 tablet (0.4 mg total) under the tongue every 5 (five) minutes as needed for chest pain.   pantoprazole  40 MG tablet Commonly known as: PROTONIX  Take 1 tablet (40 mg total) by mouth daily.   rosuvastatin  40 MG tablet Commonly known as: CRESTOR  TAKE 1 TABLET AT BEDTIME   Rybelsus  14 MG Tabs Generic drug: Semaglutide  Take 1 tablet (14 mg total) by mouth daily.   Synjardy  12.5-500 MG Tabs Generic drug: Empagliflozin -metFORMIN  HCl Take 1 tablet by mouth in the morning and at bedtime.   tiZANidine  2 MG tablet Commonly known as: ZANAFLEX  Take 1 tablet (2 mg total) by mouth every 6 (six) hours as needed for muscle spasms.   triamcinolone  cream 0.1 % Commonly known as: KENALOG  Apply to affected area(s) of skin twice daily for 14 days. (apply to skin bid 14 days)   Vitamin D  125 MCG (5000 UT) Caps Take 10,000 Units by mouth daily.   ZYRTEC ALLERGY PO Take 1 tablet by mouth daily at 6 (six) AM.         Objective:   BP 130/87   Pulse 71   Temp 98.1 F (36.7 C)   Ht 5' 7 (1.702 m)   Wt 225 lb (102.1 kg)   SpO2 96%   BMI 35.24 kg/m   Wt Readings from Last 3 Encounters:  07/21/24 225 lb (102.1 kg)  04/11/24 228 lb 9.6 oz (103.7  kg)  04/06/24 228 lb 3.2 oz (103.5 kg)    Physical Exam Vitals and nursing note reviewed.  Constitutional:      Appearance: Normal appearance.  Musculoskeletal:     Left shoulder: Tenderness (Anterior tenderness over bicipital tendon) present. No crepitus.  Neurological:     Mental Status: He is alert.    Physical Exam   VITALS: BP- 130/87 CHEST: Lungs clear to auscultation bilaterally. CARDIOVASCULAR: Heart sounds normal, no murmurs.       Results for orders placed or performed in visit on 07/20/24  CMP14+EGFR   Collection Time: 07/20/24  10:39 AM  Result Value Ref Range   Glucose 141 (H) 70 - 99 mg/dL   BUN 18 6 - 24 mg/dL   Creatinine, Ser 8.92 0.76 - 1.27 mg/dL   eGFR 81 >40 fO/fpw/8.26   BUN/Creatinine Ratio 17 9 - 20   Sodium 140 134 - 144 mmol/L   Potassium 4.4 3.5 - 5.2 mmol/L   Chloride 101 96 - 106 mmol/L   CO2 23 20 - 29 mmol/L   Calcium  10.0 8.7 - 10.2 mg/dL   Total Protein 6.9 6.0 - 8.5 g/dL   Albumin 4.6 3.8 - 4.9 g/dL   Globulin, Total 2.3 1.5 - 4.5 g/dL   Bilirubin Total 0.4 0.0 - 1.2 mg/dL   Alkaline Phosphatase 72 47 - 123 IU/L   AST 14 0 - 40 IU/L   ALT 21 0 - 44 IU/L  CBC with Differential/Platelet   Collection Time: 07/20/24 10:39 AM  Result Value Ref Range   WBC 8.3 3.4 - 10.8 x10E3/uL   RBC 4.71 4.14 - 5.80 x10E6/uL   Hemoglobin 15.2 13.0 - 17.7 g/dL   Hematocrit 53.5 62.4 - 51.0 %   MCV 99 (H) 79 - 97 fL   MCH 32.3 26.6 - 33.0 pg   MCHC 32.8 31.5 - 35.7 g/dL   RDW 87.5 88.3 - 84.5 %   Platelets 293 150 - 450 x10E3/uL   Neutrophils 60 Not Estab. %   Lymphs 26 Not Estab. %   Monocytes 11 Not Estab. %   Eos 2 Not Estab. %   Basos 1 Not Estab. %   Neutrophils Absolute 5.0 1.4 - 7.0 x10E3/uL   Lymphocytes Absolute 2.2 0.7 - 3.1 x10E3/uL   Monocytes Absolute 0.9 0.1 - 0.9 x10E3/uL   EOS (ABSOLUTE) 0.2 0.0 - 0.4 x10E3/uL   Basophils Absolute 0.1 0.0 - 0.2 x10E3/uL   Immature Granulocytes 0 Not Estab. %   Immature Grans (Abs) 0.0 0.0 - 0.1  x10E3/uL  Lipid panel   Collection Time: 07/20/24 10:39 AM  Result Value Ref Range   Cholesterol, Total 124 100 - 199 mg/dL   Triglycerides 696 (H) 0 - 149 mg/dL   HDL 35 (L) >60 mg/dL   VLDL Cholesterol Cal 46 (H) 5 - 40 mg/dL   LDL Chol Calc (NIH) 43 0 - 99 mg/dL   Chol/HDL Ratio 3.5 0.0 - 5.0 ratio  Bayer DCA Hb A1c Waived   Collection Time: 07/20/24  2:42 PM  Result Value Ref Range   HB A1C (BAYER DCA - WAIVED) 6.7 (H) 4.8 - 5.6 %    Assessment & Plan:   Problem List Items Addressed This Visit       Cardiovascular and Mediastinum   HTN (hypertension) - Primary     Endocrine   Type 2 diabetes mellitus with other specified complication (HCC)   Relevant Medications   glucose blood test strip     Other   Hyperlipidemia        Right shoulder biceps tendinopathy, 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 - Refer to sports medicine specialist, Dr. Arvell or associates, for further evaluation and management.  Acute sinusitis Acute sinusitis with recent onset, currently improving with over-the-counter sinus medication. - Continue over-the-counter sinus medication as needed. - Consider Benadryl at night to aid sinus drainage and improve sleep.  Type 2 diabetes mellitus Type 2 diabetes mellitus with well-managed  control. Hemoglobin A1c is 6.7, consistent with previous levels. - Continue Synjardy  and Rybelsus  for diabetes management. - Encourage continued physical activity, including biking and disc golf. - Encourage dietary modifications to improve diabetes control.  Mixed hyperlipidemia Mixed hyperlipidemia with improved but still elevated triglyceride levels. Inconsistent Vascepa  intake may contribute to current levels. - Continue Crestor  for cholesterol management. - Encourage dietary modifications to reduce intake  of fatty, fried, greasy, and oily foods. - Encourage increased intake of fruits and vegetables. - Encourage consistent use of Vascepa , 2 pills in the morning and 2 in the evening. - Encourage increased physical activity.  Essential hypertension Essential hypertension is well-controlled with current medication regimen. Blood pressure is 130/87. - Continue amlodipine , losartan , and metoprolol  for blood pressure management. - Encourage regular monitoring of blood pressure.         Follow up plan: Return in about 3 months (around 10/20/2024), or if symptoms worsen or fail to improve, for Diabetes.  Counseling provided for all of the vaccine components No orders of the defined types were placed in this encounter.   Fonda Levins, MD [facility] 07/21/2024, 9:13 AM

## 2024-08-03 ENCOUNTER — Other Ambulatory Visit: Payer: Self-pay

## 2024-08-03 DIAGNOSIS — J069 Acute upper respiratory infection, unspecified: Secondary | ICD-10-CM

## 2024-08-03 MED ORDER — LANCETS MISC. MISC
1.0000 | Freq: Three times a day (TID) | 0 refills | Status: AC
Start: 2024-08-03 — End: 2024-09-02

## 2024-08-03 MED ORDER — LANCET DEVICE MISC: 1.0000 | Freq: Three times a day (TID) | 0 refills | Status: AC

## 2024-08-03 MED ORDER — BLOOD GLUCOSE TEST VI STRP: 1.0000 | ORAL_STRIP | Freq: Three times a day (TID) | 3 refills | Status: AC

## 2024-08-03 MED ORDER — FLUTICASONE PROPIONATE 50 MCG/ACT NA SUSP: 2.0000 | Freq: Every day | NASAL | 3 refills | Status: AC

## 2024-08-03 MED ORDER — BLOOD GLUCOSE MONITORING SUPPL DEVI: 1.0000 | Freq: Three times a day (TID) | 0 refills | Status: AC

## 2024-08-12 ENCOUNTER — Other Ambulatory Visit: Payer: Self-pay | Admitting: Family Medicine

## 2024-09-12 ENCOUNTER — Ambulatory Visit

## 2024-09-14 ENCOUNTER — Ambulatory Visit

## 2024-09-14 ENCOUNTER — Ambulatory Visit (HOSPITAL_BASED_OUTPATIENT_CLINIC_OR_DEPARTMENT_OTHER)
Admission: RE | Admit: 2024-09-14 | Discharge: 2024-09-14 | Disposition: A | Discharge status: Home / Self Care | Source: Ambulatory Visit

## 2024-09-14 VITALS — BP 148/100 | Ht 67.0 in | Wt 225.0 lb

## 2024-09-14 DIAGNOSIS — G8929 Other chronic pain: Secondary | ICD-10-CM | POA: Insufficient documentation

## 2024-09-14 DIAGNOSIS — S46012A Strain of muscle(s) and tendon(s) of the rotator cuff of left shoulder, initial encounter: Secondary | ICD-10-CM | POA: Diagnosis not present

## 2024-09-14 DIAGNOSIS — S43432A Superior glenoid labrum lesion of left shoulder, initial encounter: Secondary | ICD-10-CM | POA: Diagnosis not present

## 2024-09-14 DIAGNOSIS — M7522 Bicipital tendinitis, left shoulder: Secondary | ICD-10-CM

## 2024-09-14 DIAGNOSIS — M25512 Pain in left shoulder: Secondary | ICD-10-CM

## 2024-09-14 DIAGNOSIS — S4992XA Unspecified injury of left shoulder and upper arm, initial encounter: Secondary | ICD-10-CM

## 2024-09-14 NOTE — Progress Notes (Addendum)
 Subjective:    Patient ID: Jimmy Francis Ina Raddle., male    DOB: 58 y.o., 01/31/1966   MRN: [id number]  Chief Complaint: Left shoulder pain  Discussed the use of AI scribe software for clinical note transcription with the patient, who gave verbal consent to proceed.  Jimmy Perez is a 58 year old male with past medical history significant for hypertension, OSA, GERD, type 2 diabetes (last A1c 6.7 on 07/16/2024) presenting with left shoulder pain.  He was originally seen in June for this following a fall where x-rays were read as negative, provided with a Medrol  Dosepak and an arm sling and recommend follow-up as needed.  History of Present Illness Jimmy Francis Ina Raddle. Jimmy Perez is a 58 year old male who presents with persistent shoulder pain following a fall in June. He is accompanied by Jimmy Perez, a family member.  Shoulder pain and functional impairment - Persistent right shoulder pain since a fall in June - Initial injury resulted in inability to raise arm for three weeks - Pain is localized to the shoulder, without radiation, numbness, or tingling - Pain severity rated 5-6/10 at its worst - Pain exacerbated by lifting the arm, bringing the arm across the body, or changing wrist positions - Tenderness present in the biceps tendon area - Pain impacts ability to perform physical job duties, requiring favoring of the uninjured arm - Cautious with use of the injured arm due to fear of giving out during heavy lifting (25-75 lbs) - Improved shoulder mobility since initial injury, but pain persists with certain movements  Prior shoulder injury and rehabilitation - Previous right shoulder injury three years ago requiring physical therapy - Attended physical therapy regularly during prior injury, with therapy location near workplace  Initial management of current injury - X-ray performed after initial injury in June - Initial treatment included use of a sling and gradual increase in shoulder motion  Hand  dominance and occupational demands - Right-hand dominant - Works in a physical job health visitor, 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  Perhaps minimally high riding humerus.  Type III acromion.     Objective:   Vitals:   09/14/24 1500  BP: (!) 148/100    Left shoulder ( compared to normal ) Inspection: - swelling, - scapular dyskinesis Palpation: TTP - greater tuberosity, - AC joint, ++ biceps tendon, + posterior shoulder AROM/PROM: 170 forward flexion, 170 abduction, 45 external rotation, internal rotation to lumbar spine Strength: 5/5 lift off, 5/5 empty can (though quite painful), 5/5 external rotation, 5/5 flexion, - drop arm test Special tests:    -Rotator Cuff: + Neer's, + Hawkin's, - empty can (though quite painful), + painful arc   -Labrum: + O'brien's, - Jerk   -Biceps: + speed's, - yergason's    -AC Joint: - cross arm testing     -Instability testing was deferred partially due to patient's pain.  Assessment & Plan:   Assessment & Plan Inferior glenoid fracture (bony Bankart or GAGL+avulsion) with rotator cuff partial thickness tearing and biceps tendinosis Fracture visualized back in June on x-rays at the time of his fall but not commented on in xray report. No report of needing shoulder reduction at time of injury but this fracture  raises concern for potential instability event.  Repeat x-rays today redemonstrating bony avulsion and otherwise no new findings compared to June shoulder imaging.  No endorsed instability since, but I would like to have him return for ultrasound evaluation of his cuff, provide further instability testing, and likely obtain advanced imaging to better  assess for structural signs of instability.

## 2024-09-15 ENCOUNTER — Ambulatory Visit: Payer: Self-pay

## 2024-09-15 ENCOUNTER — Ambulatory Visit

## 2024-09-15 VITALS — BP 112/60 | Ht 67.0 in | Wt 225.0 lb

## 2024-09-15 DIAGNOSIS — S46012A Strain of muscle(s) and tendon(s) of the rotator cuff of left shoulder, initial encounter: Secondary | ICD-10-CM

## 2024-09-15 DIAGNOSIS — G8929 Other chronic pain: Secondary | ICD-10-CM

## 2024-09-15 DIAGNOSIS — S43432A Superior glenoid labrum lesion of left shoulder, initial encounter: Secondary | ICD-10-CM

## 2024-09-15 DIAGNOSIS — M25512 Pain in left shoulder: Secondary | ICD-10-CM

## 2024-09-15 DIAGNOSIS — M7522 Bicipital tendinitis, left shoulder: Secondary | ICD-10-CM

## 2024-09-15 NOTE — Progress Notes (Signed)
   Subjective:    Patient ID: Jimmy Francis Ina Raddle., male    DOB: 58 y.o., [birth year]   MRN: [id number]  Chief Complaint: Ultrasound visit for Left Shoulder Pain  Discussed the use of AI scribe software for clinical note transcription with the patient, who gave verbal consent to proceed.  History of Present Illness  Right-hand-dominant 58 year old Recounts to me today that he did have an instability event in his left shoulder in his early 38s where a large wooden beam fell directly onto his shoulder/arm in a slightly ABducted position causing it to pop out of joint which he himself reduced later that day at home.  No recurrent instability events following this.    Objective:   There were no vitals filed for this visit.  Complete US  Examination of the Left Shoulder: - The biceps tendon is well-visualized and appears to have a hypoechogenic tract splitting the normal fiber architecture visualized in short axis with some increased vascularity surrounding the tendon visualized on Doppler uptake. - The pectoralis tendon is well-visualized traversing over the biceps tendon and appears normal. - Subscapularis tendon is poorly visualized though appears to be intact with mild increased hyper echogenic focus in the body of the tendon. - The AC joint is well-visualized and appears to have slightly increased hypoechogenic fluid signal causing a convex curvature over the top of the joint.  Mildly positive geyser sign. - The supraspinatus tendon appears to have a full-thickness disruption of the normal fiber architecture in the anterior portion of the tendon with mild retraction observed. - The infraspinatus tendon is well-visualized and appears to have some hyperechogenic signal present within the tendon body. - The teres minor tendon is well-visualized and appears normal. - Limited evaluation of the posterior joint reveals no obvious injury to the glenoid labrum.  Mildly increased hypoechogenic fluid  signal with movement today of visualized in the space between the head of the humerus and the glenoid.  Impression: - Full-thickness tear of the anterior most fibers of the supraspinatus - Biceps tendinosis with some longitudinal split tearing - Mild subscap tendinosis - Moderate subacromial bursitis - Small volume joint effusion  Ultrasound examination performed and interpreted by Redell Robes, DO      Assessment & Plan:   Assessment & Plan Jimmy Perez appears to unfortunately have sustained a full-thickness tear of his supraspinatus tendon anteriorly along with longitudinal split tearing of his biceps tendon, mild cuff tendinosis elsewhere, and a small effusion of his joint.  It is difficult for me to appreciate his Bankart lesion on ultrasound today, but I believe that his full-thickness cuff tear would be best assessed with an MRI.  Given the inclusion of the Bankart lesion, I will schedule him for an arthrogram of the shoulder next week.

## 2024-09-20 ENCOUNTER — Other Ambulatory Visit: Payer: Self-pay

## 2024-09-20 ENCOUNTER — Ambulatory Visit (HOSPITAL_BASED_OUTPATIENT_CLINIC_OR_DEPARTMENT_OTHER)
Admission: RE | Admit: 2024-09-20 | Discharge: 2024-09-20 | Disposition: A | Discharge status: Home / Self Care | Source: Ambulatory Visit

## 2024-09-20 ENCOUNTER — Ambulatory Visit

## 2024-09-20 VITALS — Ht 67.0 in | Wt 225.0 lb

## 2024-09-20 DIAGNOSIS — S46012A Strain of muscle(s) and tendon(s) of the rotator cuff of left shoulder, initial encounter: Secondary | ICD-10-CM

## 2024-09-20 DIAGNOSIS — M25512 Pain in left shoulder: Secondary | ICD-10-CM | POA: Insufficient documentation

## 2024-09-20 DIAGNOSIS — Z8739 Personal history of other diseases of the musculoskeletal system and connective tissue: Secondary | ICD-10-CM

## 2024-09-20 DIAGNOSIS — G8929 Other chronic pain: Secondary | ICD-10-CM | POA: Insufficient documentation

## 2024-09-20 DIAGNOSIS — M7522 Bicipital tendinitis, left shoulder: Secondary | ICD-10-CM

## 2024-09-20 DIAGNOSIS — S43432A Superior glenoid labrum lesion of left shoulder, initial encounter: Secondary | ICD-10-CM

## 2024-09-20 MED ORDER — METHYLPREDNISOLONE ACETATE 40 MG/ML IJ SUSP
40.0000 mg | Freq: Once | INTRAMUSCULAR | Status: AC
  Administered 2024-09-20: 40 mg via INTRA_ARTICULAR

## 2024-09-20 NOTE — Progress Notes (Signed)
   Subjective:    Patient ID: Jimmy Francis Ina Raddle., male    DOB: 58 y.o., 1966-01-23   MRN: [id number]  Chief Complaint: Left shoulder arthrogram injection visit  Discussed the use of AI scribe software for clinical note transcription with the patient, who gave verbal consent to proceed.  History of Present Illness 58 year old right-hand-dominant male with progressive left shoulder pain and prior instability event with plain film imaging revealing Bankart lesion presenting for left shoulder arthrogram injection visit prior to his scheduled MRI immediately following this.     Objective:   There were no vitals filed for this visit.  Left intra-articular Shoulder contrast injection with Ultrasound Guidance Procedure Note Jimmy Perez 10/14/1969 Indications: Pain Procedure Details Following the description of risks including infection bleeding, damage to surrounding structures, patient provided verbal/written consent for left glenohumeral arthrogram contrast injection (with steroid) procedure under ultrasound guidance. US  was used to identify the glenohumeral space. Patient was sterilely prepped in the usual fashion with chlorhexidine.  Following topical anesthetization with ethyl chloride they were injected with a solution of 0.1ml Gadavist, 40mg  Depo-medrol  and 4cc Mepivacaine 2%, and 10ml sterile saline. This was well visualized under ultrasound, please see associated photographic documentation. Patient tolerated well without complication.  Precautions provided. Cleaned and dressing applied.    Assessment & Plan:   Assessment & Plan Buck tolerated injection procedure well.  instructed him to move his left shoulder as little as possible and to proceed directly down to imaging to receive his shoulder MRI scheduled immediately following this.  Follow-up MRI results.

## 2024-09-21 ENCOUNTER — Telehealth (INDEPENDENT_AMBULATORY_CARE_PROVIDER_SITE_OTHER): Admitting: Family Medicine

## 2024-09-21 DIAGNOSIS — E1169 Type 2 diabetes mellitus with other specified complication: Secondary | ICD-10-CM | POA: Diagnosis not present

## 2024-09-21 DIAGNOSIS — J0140 Acute pansinusitis, unspecified: Secondary | ICD-10-CM | POA: Diagnosis not present

## 2024-09-21 DIAGNOSIS — J069 Acute upper respiratory infection, unspecified: Secondary | ICD-10-CM | POA: Diagnosis not present

## 2024-09-21 MED ORDER — AMOXICILLIN-POT CLAVULANATE 875-125 MG PO TABS: 1.0000 | ORAL_TABLET | Freq: Two times a day (BID) | ORAL | 0 refills | Status: AC

## 2024-09-21 MED ORDER — PREDNISONE 20 MG PO TABS: 20.0000 mg | ORAL_TABLET | Freq: Every day | ORAL | 0 refills | Status: AC

## 2024-09-21 NOTE — Progress Notes (Signed)
 Virtual Visit via Video   I connected with patient on 09/21/24 at 0910 by a video enabled telemedicine application and verified that I am speaking with the correct person using two identifiers.  Location patient: Home Location provider: [facility] Persons participating in the virtual visit: Patient and Provider  I discussed the limitations of evaluation and management by telemedicine and the availability of in person appointments. The patient expressed understanding and agreed to proceed.  Subjective:   HPI:  Pt presents today for  Chief Complaint  Patient presents with   Sinus Problem   Jimmy Perez is a 58 year old male who presents with sinus congestion and cough.  He has been experiencing sinus congestion, coughing, and production of green, chunky mucus. The mucus is described as slightly green and chunky. Over-the-counter cold and sinus medications, as well as Flonase , have been used. He is unsure of the specific medications as he was purchased by [employer].  No fevers or chills have been noted during this episode. He mentions that his blood sugars have been good, although he has not checked them in about eight days. He typically monitors them and reports any issues to [place].       ROS per HPI  Patient Active Problem List   Diagnosis Date Noted   Vitamin D  deficiency 10/11/2020   Type 2 diabetes mellitus with other specified complication (HCC) 12/14/2017   Obstructive sleep apnea 11/06/2014   Metabolic syndrome 04/14/2014   HTN (hypertension) 04/18/2013   Hyperlipidemia 04/18/2013   GERD (gastroesophageal reflux disease) 04/18/2013   Morbid obesity (HCC) 04/18/2013    Social History   Tobacco Use   Smoking status: Never   Smokeless tobacco: Current    Types: Snuff   Tobacco comments:    off an on (pouches) of snuff  Substance Use Topics   Alcohol use: Yes    Alcohol/week: 6.0 standard drinks of alcohol    Types: 6  Cans of beer per week    Comment: occasional- depending on the occasion    Current Outpatient Medications:    albuterol  (VENTOLIN  HFA) 108 (90 Base) MCG/ACT inhaler, Inhale 2 puffs into the lungs every 6 (six) hours as needed for wheezing or shortness of breath., Disp: 8.5 g, Rfl: 0   amLODipine  (NORVASC ) 5 MG tablet, Take 1 tablet (5 mg total) by mouth daily., Disp: 90 tablet, Rfl: 3   amoxicillin -clavulanate (AUGMENTIN ) 875-125 MG tablet, Take 1 tablet by mouth 2 (two) times daily., Disp: 20 tablet, Rfl: 0   aspirin  EC 81 MG tablet, Take 81 mg by mouth daily., Disp: , Rfl:    Blood Glucose Monitoring Suppl DEVI, 1 each by Does not apply route in the morning, at noon, and at bedtime. May substitute to any manufacturer covered by patient's insurance., Disp: 1 each, Rfl: 0   busPIRone  (BUSPAR ) 15 MG tablet, Take 1 tablet (15 mg total) by mouth 2 (two) times daily as needed., Disp: 180 tablet, Rfl: 3   Cetirizine HCl (ZYRTEC ALLERGY PO), Take 1 tablet by mouth daily at 6 (six) AM., Disp: , Rfl:    Cholecalciferol (VITAMIN D ) 125 MCG (5000 UT) CAPS, Take 10,000 Units by mouth daily., Disp: , Rfl:    colchicine  0.6 MG tablet, TAKE 1.2 MG (2 TABLETS) BY MOUTH ONCE, THEN TAKE 0.6 MG ONE HOUR LATER. USE AS NEEDED FOR GOUT FLARE. DO NOT REPEAT FOR AT LEAST 3 DAYS., Disp: 9 tablet, Rfl: 0   Empagliflozin -metFORMIN  HCl (  SYNJARDY ) 12.5-500 MG TABS, Take 1 tablet by mouth in the morning and at bedtime., Disp: 180 tablet, Rfl: 5   fenofibrate  (TRICOR ) 145 MG tablet, TAKE 1 TABLET DAILY, Disp: 90 tablet, Rfl: 1   fluticasone  (FLONASE ) 50 MCG/ACT nasal spray, Place 2 sprays into both nostrils daily., Disp: 48 g, Rfl: 3   Glucose Blood (BLOOD GLUCOSE TEST STRIPS) STRP, 1 each by Does not apply route in the morning, at noon, and at bedtime. May substitute to any manufacturer covered by patient's insurance., Disp: 100 strip, Rfl: 3   glucose blood test strip, Use up to four times daily as directed., Disp: 100 each,  Rfl: 3   icosapent  Ethyl (VASCEPA ) 1 g capsule, Take 2 capsules (2 g total) by mouth 2 (two) times daily., Disp: 360 capsule, Rfl: 3   Lancets (FREESTYLE) lancets, Use to check blood sugar up to four times daily as directed., Disp: 100 each, Rfl: 3   losartan  (COZAAR ) 50 MG tablet, Take 1 tablet (50 mg total) by mouth daily., Disp: 90 tablet, Rfl: 3   metoprolol  succinate (TOPROL -XL) 25 MG 24 hr tablet, Take 1 tablet (25 mg total) by mouth daily., Disp: 90 tablet, Rfl: 3   Multiple Minerals-Vitamins (CALCIUM -MAGNESIUM -ZINC-D3 PO), Take 1 tablet by mouth daily at 6 (six) AM., Disp: , Rfl:    nitroGLYCERIN  (NITROSTAT ) 0.4 MG SL tablet, Place 1 tablet (0.4 mg total) under the tongue every 5 (five) minutes as needed for chest pain., Disp: 25 tablet, Rfl: 1   pantoprazole  (PROTONIX ) 40 MG tablet, Take 1 tablet (40 mg total) by mouth daily., Disp: 90 tablet, Rfl: 3   predniSONE  (DELTASONE ) 20 MG tablet, Take 1 tablet (20 mg total) by mouth daily with breakfast for 5 days., Disp: 5 tablet, Rfl: 0   rosuvastatin  (CRESTOR ) 40 MG tablet, TAKE 1 TABLET AT BEDTIME, Disp: 90 tablet, Rfl: 1   Semaglutide  (RYBELSUS ) 14 MG TABS, Take 1 tablet (14 mg total) by mouth daily., Disp: 90 tablet, Rfl: 3   tiZANidine  (ZANAFLEX ) 2 MG tablet, Take 1 tablet (2 mg total) by mouth every 6 (six) hours as needed for muscle spasms., Disp: 60 tablet, Rfl: 1   triamcinolone  cream (KENALOG ) 0.1 %, apply to skin bid 14 days, Disp: 60 g, Rfl: 0  Allergies  Allergen Reactions   Trulicity  [Dulaglutide ] Nausea Only    Objective:   There were no vitals taken for this visit.  Patient is well-developed, well-nourished in no acute distress.  Wet cough and congestion noted. Head is normocephalic, atraumatic.  No labored breathing.  Speech is clear and coherent with logical content.  Patient is alert and oriented at baseline.    Assessment and Plan:   Jimmy Perez was seen today for sinus problem.  Diagnoses and all orders  for this visit:  Acute non-recurrent pansinusitis -     predniSONE  (DELTASONE ) 20 MG tablet; Take 1 tablet (20 mg total) by mouth daily with breakfast for 5 days. -     amoxicillin -clavulanate (AUGMENTIN ) 875-125 MG tablet; Take 1 tablet by mouth 2 (two) times daily.  Type 2 diabetes mellitus with other specified complication, without long-term current use of insulin (HCC)      Acute upper respiratory infection with cough and purulent sputum Acute upper respiratory infection with symptoms of cough and purulent sputum since Sunday. No fever or chills reported. Over-the-counter treatments have been ineffective. - Prescribed antibiotics and sent prescription to [facility] in [place]. - Prescribed low dose steroid to reduce inflammation. - Continue Flonase . -  Encourage hydration.  Type 2 diabetes mellitus Blood sugars have been well-controlled, but he has not checked them in eight days. Advised to monitor blood sugars closely, especially with the addition of a steroid, which can affect glucose levels. - Monitor blood sugars closely, especially with the addition of a steroid.        Return if symptoms worsen or fail to improve.  Rosaline Bruns, FNP-C [facility] [address] [place], [place] [address] [phone]  09/21/2024  Time spent with the patient: 12 minutes, of which >50% was spent in obtaining information about symptoms, reviewing previous labs, evaluations, and treatments, counseling about condition (please see the discussed topics above), and developing a plan to further investigate it; had a number of questions which I addressed.

## 2024-09-29 ENCOUNTER — Telehealth

## 2024-09-29 DIAGNOSIS — S46219A Strain of muscle, fascia and tendon of other parts of biceps, unspecified arm, initial encounter: Secondary | ICD-10-CM

## 2024-09-29 DIAGNOSIS — M75122 Complete rotator cuff tear or rupture of left shoulder, not specified as traumatic: Secondary | ICD-10-CM

## 2024-09-29 DIAGNOSIS — M19012 Primary osteoarthritis, left shoulder: Secondary | ICD-10-CM

## 2024-09-29 DIAGNOSIS — M75102 Unspecified rotator cuff tear or rupture of left shoulder, not specified as traumatic: Secondary | ICD-10-CM

## 2024-09-29 DIAGNOSIS — S46812A Strain of other muscles, fascia and tendons at shoulder and upper arm level, left arm, initial encounter: Secondary | ICD-10-CM

## 2024-09-29 NOTE — Progress Notes (Signed)
    Patient ID: Jimmy Perez., male    DOB: 58 y.o., 25-Mar-1966   MRN: [id number]   Chief Complaint: Video Visit for MRI Result Review  Patient Location: [place], [place] Provider Location: [place], [place]    Discussed the use of AI scribe software for clinical note transcription with the patient, who gave verbal consent to proceed.  History of Present Illness Jimmy Perez. Jimmy Perez is a 58 year old male who presents with shoulder pain and functional limitations due to a complete rotator cuff tear.  Shoulder pain and functional limitation - Shoulder pain with significant limitation in arm elevation and rotation - Concern for long-term shoulder stability and compensatory movement patterns - Able to complete work duties by minimizing use of the affected arm; does not perform physical labor  09/20/2024 left shoulder MRI: IMPRESSION: 1. Full thickness rupture of the subscapularis tendon retracted 3 cm with mild subscapularis and supraspinatus atrophy. 2. Full thickness partial width tear of the anterior supraspinatus tendon with contrast extending into the subacromial subdeltoid bursa and the mesoacromiale os acromiale synchondrosis possibly indicating instability. 3. At least partial tear of the intraarticular segment of the long head of the biceps tendon. 4. Degenerative acromioclavicular joint arthropathy with mild spurring and subcortical marrow edema. 5. Motion artifact reduces diagnostic sensitivity and specificity.  Physical Exam   Const: appears well, non-toxic, well groomed Psych: affect bright, interactive, smiling EENT: EOMI intact, conjunctiva appear normal Neck: no obvious masses, appears symmetric Resp: non-labored, appears symmetric Neuro: muscle bulk appears normal Skin: no obvious rashes noted  Assessment & Plan Full thickness rotator cuff tear of the left shoulder   Discussed MRI findings with patient including concern for stability of joint which  matches with his Bankart lesion and prior episode of instability.  Answered questions regarding potential weakness, and impaired range of motion that this may confer in the future if not addressed adequately now.  Referral placed to surgeon.

## 2024-10-17 ENCOUNTER — Other Ambulatory Visit: Payer: Self-pay | Admitting: Family

## 2024-10-17 MED ORDER — DOXYCYCLINE MONOHYDRATE 100 MG PO TABS: 100.0000 mg | ORAL_TABLET | Freq: Two times a day (BID) | ORAL | 0 refills | Status: AC

## 2024-10-17 NOTE — Progress Notes (Signed)
 Pt calls the office today with recurrent sinus problem. Was feeling better with Augmentin , but symptoms have returned and feels worse. Doxycycline  Prescription sent to pharmacy

## 2024-11-02 ENCOUNTER — Other Ambulatory Visit (HOSPITAL_COMMUNITY): Payer: Self-pay

## 2024-11-02 ENCOUNTER — Ambulatory Visit

## 2024-11-10 ENCOUNTER — Ambulatory Visit (INDEPENDENT_AMBULATORY_CARE_PROVIDER_SITE_OTHER): Admitting: Family Medicine

## 2024-11-10 DIAGNOSIS — I10 Essential (primary) hypertension: Secondary | ICD-10-CM

## 2024-11-10 NOTE — Progress Notes (Signed)
 Pt in today for EKG for surgical clearance, tolerated well.

## 2024-11-11 ENCOUNTER — Telehealth: Payer: Self-pay

## 2024-11-11 ENCOUNTER — Ambulatory Visit: Payer: Self-pay | Admitting: Family Medicine

## 2024-11-11 NOTE — Telephone Encounter (Signed)
 Faxed surgical clearance form, OV note, labs and EKG to [facility] to [phone]

## 2024-11-16 ENCOUNTER — Telehealth: Payer: Self-pay

## 2024-11-16 NOTE — Telephone Encounter (Signed)
 Wife called in stating that pts surgery has been moved up to the Friday. She nor pt were aware that pt needed to avoid Synjardy , Rybelsus , Vascepa .  Dettinger informed to have pt call surgeon and make aware that they will start holding meds tonight and see if they will be alright moving forward with procedure. Wife understood.

## 2024-11-18 ENCOUNTER — Ambulatory Visit: Admitting: Family Medicine

## 2024-11-18 ENCOUNTER — Other Ambulatory Visit (HOSPITAL_COMMUNITY): Payer: Self-pay

## 2024-11-18 MED ORDER — CELECOXIB 100 MG PO CAPS
100.0000 mg | ORAL_CAPSULE | Freq: Two times a day (BID) | ORAL | 0 refills | Status: AC
  Filled 2024-11-18: qty 28, 14d supply, fill #0

## 2024-11-18 MED ORDER — ACETAMINOPHEN 500 MG PO TABS
1000.0000 mg | ORAL_TABLET | Freq: Three times a day (TID) | ORAL | 0 refills | Status: AC
  Filled 2024-11-18: qty 84, 14d supply, fill #0

## 2024-11-18 MED ORDER — OXYCODONE HCL 5 MG PO TABS
5.0000 mg | ORAL_TABLET | Freq: Four times a day (QID) | ORAL | 0 refills | Status: AC | PRN
  Filled 2024-11-18: qty 30, 7d supply, fill #0

## 2024-11-18 MED ORDER — ONDANSETRON 4 MG PO TBDP
4.0000 mg | ORAL_TABLET | Freq: Four times a day (QID) | ORAL | 0 refills | Status: AC
  Filled 2024-11-18: qty 10, 3d supply, fill #0

## 2024-11-24 ENCOUNTER — Ambulatory Visit

## 2024-11-30 ENCOUNTER — Other Ambulatory Visit: Payer: Self-pay | Admitting: Family Medicine

## 2024-11-30 DIAGNOSIS — E782 Mixed hyperlipidemia: Secondary | ICD-10-CM

## 2024-12-16 ENCOUNTER — Ambulatory Visit: Admitting: Family Medicine

## 2024-12-29 ENCOUNTER — Ambulatory Visit: Admitting: Rehabilitation
